# Patient Record
Sex: Male | Born: 1955 | Race: Black or African American | Hispanic: No | Marital: Single | Smoking: Never smoker
Health system: Southern US, Community
[De-identification: ages and names within clinical notes are randomized; demographics above are authoritative.]

## PROBLEM LIST (undated history)

## (undated) DIAGNOSIS — G629 Polyneuropathy, unspecified: Secondary | ICD-10-CM

## (undated) DIAGNOSIS — F32A Depression, unspecified: Secondary | ICD-10-CM

## (undated) DIAGNOSIS — I509 Heart failure, unspecified: Secondary | ICD-10-CM

## (undated) DIAGNOSIS — I10 Essential (primary) hypertension: Secondary | ICD-10-CM

## (undated) DIAGNOSIS — M869 Osteomyelitis, unspecified: Secondary | ICD-10-CM

## (undated) DIAGNOSIS — F329 Major depressive disorder, single episode, unspecified: Secondary | ICD-10-CM

## (undated) DIAGNOSIS — E785 Hyperlipidemia, unspecified: Secondary | ICD-10-CM

## (undated) DIAGNOSIS — F1010 Alcohol abuse, uncomplicated: Secondary | ICD-10-CM

## (undated) DIAGNOSIS — Z8614 Personal history of Methicillin resistant Staphylococcus aureus infection: Secondary | ICD-10-CM

## (undated) DIAGNOSIS — E119 Type 2 diabetes mellitus without complications: Secondary | ICD-10-CM

## (undated) HISTORY — DX: Personal history of Methicillin resistant Staphylococcus aureus infection: Z86.14

## (undated) HISTORY — DX: Osteomyelitis, unspecified: M86.9

## (undated) HISTORY — DX: Major depressive disorder, single episode, unspecified: F32.9

## (undated) HISTORY — PX: AMPUTATION OF REPLICATED TOES: SHX1136

## (undated) HISTORY — DX: Depression, unspecified: F32.A

## (undated) HISTORY — DX: Heart failure, unspecified: I50.9

## (undated) HISTORY — DX: Polyneuropathy, unspecified: G62.9

## (undated) HISTORY — DX: Alcohol abuse, uncomplicated: F10.10

## (undated) HISTORY — DX: Type 2 diabetes mellitus without complications: E11.9

## (undated) HISTORY — DX: Essential (primary) hypertension: I10

## (undated) HISTORY — DX: Hyperlipidemia, unspecified: E78.5

---

## 2012-05-07 ENCOUNTER — Emergency Department: Payer: Self-pay | Admitting: Emergency Medicine

## 2012-05-31 ENCOUNTER — Observation Stay: Payer: Self-pay | Admitting: Internal Medicine

## 2012-05-31 LAB — CBC
HCT: 31.9 % — ABNORMAL LOW (ref 40.0–52.0)
MCH: 31.9 pg (ref 26.0–34.0)
MCHC: 34.6 g/dL (ref 32.0–36.0)
MCV: 92 fL (ref 80–100)
RDW: 12.7 % (ref 11.5–14.5)
WBC: 3.8 10*3/uL (ref 3.8–10.6)

## 2012-05-31 LAB — BASIC METABOLIC PANEL
Anion Gap: 7 (ref 7–16)
Calcium, Total: 8.4 mg/dL — ABNORMAL LOW (ref 8.5–10.1)
Chloride: 105 mmol/L (ref 98–107)
Co2: 26 mmol/L (ref 21–32)
Creatinine: 2.1 mg/dL — ABNORMAL HIGH (ref 0.60–1.30)
EGFR (African American): 39 — ABNORMAL LOW
EGFR (Non-African Amer.): 34 — ABNORMAL LOW
Glucose: 154 mg/dL — ABNORMAL HIGH (ref 65–99)
Sodium: 138 mmol/L (ref 136–145)

## 2012-05-31 LAB — CK TOTAL AND CKMB (NOT AT ARMC)
CK, Total: 245 U/L — ABNORMAL HIGH (ref 35–232)
CK, Total: 186 U/L (ref 35–232)
CK-MB: 3.8 ng/mL — ABNORMAL HIGH (ref 0.5–3.6)
CK-MB: 3 ng/mL (ref 0.5–3.6)
CK-MB: 2.1 ng/mL (ref 0.5–3.6)

## 2012-05-31 LAB — TROPONIN I
Troponin-I: <0.02 ng/mL
Troponin-I: <0.02 ng/mL
Troponin-I: <0.02 ng/mL

## 2012-06-01 LAB — LIPID PANEL
HDL Cholesterol: 38 mg/dL — ABNORMAL LOW (ref 40–60)
Triglycerides: 230 mg/dL — ABNORMAL HIGH (ref 0–200)
VLDL Cholesterol, Calc: 46 mg/dL — ABNORMAL HIGH (ref 5–40)

## 2012-06-01 LAB — CREATININE, SERUM: EGFR (African American): 57 — ABNORMAL LOW

## 2012-06-24 ENCOUNTER — Emergency Department: Payer: Self-pay

## 2012-06-24 LAB — COMPREHENSIVE METABOLIC PANEL
Albumin: 3.5 g/dL (ref 3.4–5.0)
Alkaline Phosphatase: 76 U/L (ref 50–136)
Anion Gap: 8 (ref 7–16)
Bilirubin,Total: 0.3 mg/dL (ref 0.2–1.0)
Calcium, Total: 8.5 mg/dL (ref 8.5–10.1)
Chloride: 104 mmol/L (ref 98–107)
Creatinine: 1.85 mg/dL — ABNORMAL HIGH (ref 0.60–1.30)
EGFR (African American): 46 — ABNORMAL LOW
Glucose: 197 mg/dL — ABNORMAL HIGH (ref 65–99)
Osmolality: 280 (ref 275–301)
Potassium: 4.1 mmol/L (ref 3.5–5.1)
Sodium: 135 mmol/L — ABNORMAL LOW (ref 136–145)
Total Protein: 6.8 g/dL (ref 6.4–8.2)

## 2012-06-24 LAB — CBC
HGB: 9.8 g/dL — ABNORMAL LOW (ref 13.0–18.0)
MCH: 29.3 pg (ref 26.0–34.0)
MCHC: 31.8 g/dL — ABNORMAL LOW (ref 32.0–36.0)
Platelet: 188 10*3/uL (ref 150–440)
WBC: 3.9 10*3/uL (ref 3.8–10.6)

## 2012-06-24 LAB — ETHANOL
Ethanol %: 0.089 % — ABNORMAL HIGH (ref 0.000–0.080)
Ethanol: 89 mg/dL

## 2013-04-27 ENCOUNTER — Emergency Department: Payer: Self-pay | Admitting: Emergency Medicine

## 2013-04-27 LAB — BASIC METABOLIC PANEL
Anion Gap: 7 (ref 7–16)
BUN: 32 mg/dL — ABNORMAL HIGH (ref 7–18)
Calcium, Total: 8.8 mg/dL (ref 8.5–10.1)
Chloride: 102 mmol/L (ref 98–107)
Co2: 27 mmol/L (ref 21–32)
Creatinine: 4.03 mg/dL — ABNORMAL HIGH (ref 0.60–1.30)
EGFR (African American): 18 — ABNORMAL LOW
EGFR (Non-African Amer.): 15 — ABNORMAL LOW
Glucose: 137 mg/dL — ABNORMAL HIGH (ref 65–99)
Osmolality: 281 (ref 275–301)
Potassium: 5.2 mmol/L — ABNORMAL HIGH (ref 3.5–5.1)
Sodium: 136 mmol/L (ref 136–145)

## 2013-04-27 LAB — URINALYSIS, COMPLETE
Bacteria: NONE SEEN
Bilirubin,UR: NEGATIVE
Glucose,UR: NEGATIVE mg/dL (ref 0–75)
Hyaline Cast: 8
Ketone: NEGATIVE
Leukocyte Esterase: NEGATIVE
Nitrite: NEGATIVE
Ph: 5 (ref 4.5–8.0)
Protein: 100
RBC,UR: 1 /HPF (ref 0–5)
Specific Gravity: 1.015 (ref 1.003–1.030)
Squamous Epithelial: <1
WBC UR: <1 /HPF (ref 0–5)

## 2013-04-27 LAB — CBC
MCHC: 34.6 g/dL (ref 32.0–36.0)
MCV: 88 fL (ref 80–100)
RBC: 4 10*6/uL — ABNORMAL LOW (ref 4.40–5.90)
WBC: 5.9 10*3/uL (ref 3.8–10.6)

## 2013-04-27 LAB — TROPONIN I: Troponin-I: <0.02 ng/mL

## 2013-05-21 ENCOUNTER — Emergency Department: Payer: Self-pay | Admitting: Emergency Medicine

## 2013-05-21 LAB — CBC WITH DIFFERENTIAL/PLATELET
Basophil %: 0.7 %
Eosinophil #: 0.1 10*3/uL (ref 0.0–0.7)
Eosinophil %: 1.7 %
HCT: 31.8 % — ABNORMAL LOW (ref 40.0–52.0)
HGB: 11.3 g/dL — ABNORMAL LOW (ref 13.0–18.0)
Lymphocyte #: 1.6 10*3/uL (ref 1.0–3.6)
Lymphocyte %: 38.8 %
Monocyte %: 8.2 %
Neutrophil %: 50.6 %
RBC: 3.65 10*6/uL — ABNORMAL LOW (ref 4.40–5.90)
RDW: 12.8 % (ref 11.5–14.5)
WBC: 4.2 10*3/uL (ref 3.8–10.6)

## 2013-05-21 LAB — COMPREHENSIVE METABOLIC PANEL
Albumin: 3.3 g/dL — ABNORMAL LOW (ref 3.4–5.0)
Alkaline Phosphatase: 74 U/L (ref 50–136)
Anion Gap: 6 — ABNORMAL LOW (ref 7–16)
Bilirubin,Total: 0.2 mg/dL (ref 0.2–1.0)
Calcium, Total: 8.7 mg/dL (ref 8.5–10.1)
Chloride: 108 mmol/L — ABNORMAL HIGH (ref 98–107)
Co2: 23 mmol/L (ref 21–32)
Creatinine: 1.83 mg/dL — ABNORMAL HIGH (ref 0.60–1.30)
EGFR (African American): 46 — ABNORMAL LOW
Osmolality: 282 (ref 275–301)
Potassium: 4.3 mmol/L (ref 3.5–5.1)
SGPT (ALT): 29 U/L (ref 12–78)

## 2013-07-26 ENCOUNTER — Inpatient Hospital Stay: Payer: Self-pay | Admitting: Internal Medicine

## 2013-07-26 LAB — CBC
HCT: 29.8 % — ABNORMAL LOW (ref 40.0–52.0)
HGB: 10.5 g/dL — ABNORMAL LOW (ref 13.0–18.0)
MCHC: 35.3 g/dL (ref 32.0–36.0)
RDW: 13.3 % (ref 11.5–14.5)
WBC: 5.1 10*3/uL (ref 3.8–10.6)

## 2013-07-26 LAB — URINALYSIS, COMPLETE
Bilirubin,UR: NEGATIVE
Glucose,UR: >=500 mg/dL (ref 0–75)
Leukocyte Esterase: NEGATIVE
RBC,UR: <1 /HPF (ref 0–5)
WBC UR: NONE SEEN /HPF (ref 0–5)

## 2013-07-26 LAB — COMPREHENSIVE METABOLIC PANEL
Albumin: 3.4 g/dL (ref 3.4–5.0)
Anion Gap: 8 (ref 7–16)
BUN: 21 mg/dL — ABNORMAL HIGH (ref 7–18)
Bilirubin,Total: 0.4 mg/dL (ref 0.2–1.0)
Calcium, Total: 8.7 mg/dL (ref 8.5–10.1)
Co2: 24 mmol/L (ref 21–32)
Creatinine: 2.33 mg/dL — ABNORMAL HIGH (ref 0.60–1.30)
EGFR (Non-African Amer.): 29 — ABNORMAL LOW
Glucose: 287 mg/dL — ABNORMAL HIGH (ref 65–99)
Osmolality: 258 (ref 275–301)
SGPT (ALT): 34 U/L (ref 12–78)
Sodium: 121 mmol/L — ABNORMAL LOW (ref 136–145)
Total Protein: 6.9 g/dL (ref 6.4–8.2)

## 2013-07-26 LAB — CK TOTAL AND CKMB (NOT AT ARMC): CK, Total: 585 U/L — ABNORMAL HIGH (ref 35–232)

## 2013-07-27 LAB — CK TOTAL AND CKMB (NOT AT ARMC)
CK, Total: 469 U/L — ABNORMAL HIGH (ref 35–232)
CK, Total: 432 U/L — ABNORMAL HIGH (ref 35–232)
CK-MB: 6.5 ng/mL — ABNORMAL HIGH (ref 0.5–3.6)
CK-MB: 5.8 ng/mL — ABNORMAL HIGH (ref 0.5–3.6)

## 2013-07-28 LAB — CBC WITH DIFFERENTIAL/PLATELET
Basophil #: 0 10*3/uL (ref 0.0–0.1)
Eosinophil #: 0.2 10*3/uL (ref 0.0–0.7)
HGB: 10.3 g/dL — ABNORMAL LOW (ref 13.0–18.0)
Lymphocyte %: 36.7 %
MCH: 31.4 pg (ref 26.0–34.0)
Monocyte #: 0.5 x10 3/mm (ref 0.2–1.0)
Neutrophil #: 2.2 10*3/uL (ref 1.4–6.5)
Neutrophil %: 48.1 %
Platelet: 186 10*3/uL (ref 150–440)
RBC: 3.3 10*6/uL — ABNORMAL LOW (ref 4.40–5.90)
RDW: 13.4 % (ref 11.5–14.5)
WBC: 4.6 10*3/uL (ref 3.8–10.6)

## 2013-07-28 LAB — BASIC METABOLIC PANEL
Chloride: 101 mmol/L (ref 98–107)
Creatinine: 1.47 mg/dL — ABNORMAL HIGH (ref 0.60–1.30)
EGFR (African American): 60 — ABNORMAL LOW
EGFR (Non-African Amer.): 51 — ABNORMAL LOW
Glucose: 122 mg/dL — ABNORMAL HIGH (ref 65–99)
Osmolality: 267 (ref 275–301)
Potassium: 4.4 mmol/L (ref 3.5–5.1)
Sodium: 132 mmol/L — ABNORMAL LOW (ref 136–145)

## 2014-04-11 ENCOUNTER — Inpatient Hospital Stay: Payer: Self-pay | Admitting: Specialist

## 2014-04-11 LAB — BASIC METABOLIC PANEL
ANION GAP: 6 — AB (ref 7–16)
Anion Gap: 9 (ref 7–16)
BUN: 9 mg/dL (ref 7–18)
BUN: 8 mg/dL (ref 7–18)
CALCIUM: 8.2 mg/dL — AB (ref 8.5–10.1)
CALCIUM: 8.2 mg/dL — AB (ref 8.5–10.1)
Chloride: 85 mmol/L — ABNORMAL LOW (ref 98–107)
Chloride: 87 mmol/L — ABNORMAL LOW (ref 98–107)
Co2: 23 mmol/L (ref 21–32)
Co2: 26 mmol/L (ref 21–32)
Creatinine: 1.36 mg/dL — ABNORMAL HIGH (ref 0.60–1.30)
Creatinine: 1.32 mg/dL — ABNORMAL HIGH (ref 0.60–1.30)
EGFR (African American): >60
EGFR (Non-African Amer.): 57 — ABNORMAL LOW
EGFR (Non-African Amer.): 59 — ABNORMAL LOW
GLUCOSE: 152 mg/dL — AB (ref 65–99)
Glucose: 160 mg/dL — ABNORMAL HIGH (ref 65–99)
Osmolality: 238 (ref 275–301)
Osmolality: 242 (ref 275–301)
POTASSIUM: 3.8 mmol/L (ref 3.5–5.1)
Potassium: 4.4 mmol/L (ref 3.5–5.1)
Sodium: 117 mmol/L — CL (ref 136–145)
Sodium: 119 mmol/L — CL (ref 136–145)

## 2014-04-11 LAB — CBC WITH DIFFERENTIAL/PLATELET
BASOS PCT: 0.7 %
Basophil #: 0 10*3/uL (ref 0.0–0.1)
EOS ABS: 0 10*3/uL (ref 0.0–0.7)
EOS PCT: 0.6 %
HCT: 30.9 % — ABNORMAL LOW (ref 40.0–52.0)
HGB: 10.7 g/dL — AB (ref 13.0–18.0)
Lymphocyte #: 1.1 10*3/uL (ref 1.0–3.6)
Lymphocyte %: 23.3 %
MCH: 30.7 pg (ref 26.0–34.0)
MCHC: 34.6 g/dL (ref 32.0–36.0)
MCV: 89 fL (ref 80–100)
MONOS PCT: 15.5 %
Monocyte #: 0.7 x10 3/mm (ref 0.2–1.0)
NEUTROS ABS: 2.8 10*3/uL (ref 1.4–6.5)
NEUTROS PCT: 59.9 %
PLATELETS: 220 10*3/uL (ref 150–440)
RBC: 3.49 10*6/uL — AB (ref 4.40–5.90)
RDW: 12.9 % (ref 11.5–14.5)
WBC: 4.7 10*3/uL (ref 3.8–10.6)

## 2014-04-11 LAB — URINALYSIS, COMPLETE
Bacteria: NONE SEEN
Bilirubin,UR: NEGATIVE
Ketone: NEGATIVE
Leukocyte Esterase: NEGATIVE
Nitrite: NEGATIVE
PH: 5 (ref 4.5–8.0)
RBC,UR: 1 /HPF (ref 0–5)
Specific Gravity: 1.01 (ref 1.003–1.030)
Squamous Epithelial: 1
WBC UR: 1 /HPF (ref 0–5)

## 2014-04-11 LAB — MAGNESIUM: Magnesium: 1.1 mg/dL — ABNORMAL LOW

## 2014-04-11 LAB — CK-MB
CK-MB: 3.8 ng/mL — ABNORMAL HIGH (ref 0.5–3.6)
CK-MB: 4 ng/mL — ABNORMAL HIGH (ref 0.5–3.6)

## 2014-04-11 LAB — TROPONIN I

## 2014-04-12 DIAGNOSIS — I059 Rheumatic mitral valve disease, unspecified: Secondary | ICD-10-CM

## 2014-04-12 LAB — CBC WITH DIFFERENTIAL/PLATELET
BASOS ABS: 0 10*3/uL (ref 0.0–0.1)
Basophil %: 0.2 %
EOS ABS: 0 10*3/uL (ref 0.0–0.7)
EOS PCT: 0.8 %
HCT: 28.5 % — ABNORMAL LOW (ref 40.0–52.0)
HGB: 10 g/dL — AB (ref 13.0–18.0)
LYMPHS ABS: 1.2 10*3/uL (ref 1.0–3.6)
Lymphocyte %: 30.6 %
MCH: 30.7 pg (ref 26.0–34.0)
MCHC: 35.3 g/dL (ref 32.0–36.0)
MCV: 87 fL (ref 80–100)
Monocyte #: 0.5 x10 3/mm (ref 0.2–1.0)
Monocyte %: 13.7 %
NEUTROS ABS: 2.2 10*3/uL (ref 1.4–6.5)
Neutrophil %: 54.7 %
Platelet: 210 10*3/uL (ref 150–440)
RBC: 3.27 10*6/uL — AB (ref 4.40–5.90)
RDW: 12.7 % (ref 11.5–14.5)
WBC: 4 10*3/uL (ref 3.8–10.6)

## 2014-04-12 LAB — BASIC METABOLIC PANEL
Anion Gap: 7 (ref 7–16)
Anion Gap: 6 — ABNORMAL LOW (ref 7–16)
Anion Gap: 4 — ABNORMAL LOW (ref 7–16)
Anion Gap: 4 — ABNORMAL LOW (ref 7–16)
BUN: 12 mg/dL (ref 7–18)
BUN: 11 mg/dL (ref 7–18)
BUN: 10 mg/dL (ref 7–18)
BUN: 12 mg/dL (ref 7–18)
CALCIUM: 8.9 mg/dL (ref 8.5–10.1)
CALCIUM: 7.9 mg/dL — AB (ref 8.5–10.1)
CHLORIDE: 89 mmol/L — AB (ref 98–107)
CHLORIDE: 92 mmol/L — AB (ref 98–107)
CO2: 26 mmol/L (ref 21–32)
CO2: 28 mmol/L (ref 21–32)
CREATININE: 1.36 mg/dL — AB (ref 0.60–1.30)
CREATININE: 1.38 mg/dL — AB (ref 0.60–1.30)
Calcium, Total: 8.3 mg/dL — ABNORMAL LOW (ref 8.5–10.1)
Calcium, Total: 9.1 mg/dL (ref 8.5–10.1)
Chloride: 88 mmol/L — ABNORMAL LOW (ref 98–107)
Chloride: 93 mmol/L — ABNORMAL LOW (ref 98–107)
Co2: 25 mmol/L (ref 21–32)
Co2: 28 mmol/L (ref 21–32)
Creatinine: 1.28 mg/dL (ref 0.60–1.30)
Creatinine: 1.36 mg/dL — ABNORMAL HIGH (ref 0.60–1.30)
EGFR (African American): >60
EGFR (African American): >60
EGFR (African American): >60
EGFR (Non-African Amer.): 57 — ABNORMAL LOW
EGFR (Non-African Amer.): >60
GFR CALC NON AF AMER: 57 — AB
GFR CALC NON AF AMER: 56 — AB
GLUCOSE: 115 mg/dL — AB (ref 65–99)
GLUCOSE: 131 mg/dL — AB (ref 65–99)
Glucose: 162 mg/dL — ABNORMAL HIGH (ref 65–99)
Glucose: 177 mg/dL — ABNORMAL HIGH (ref 65–99)
OSMOLALITY: 248 (ref 275–301)
OSMOLALITY: 252 (ref 275–301)
Osmolality: 245 (ref 275–301)
Osmolality: 251 (ref 275–301)
POTASSIUM: 4.8 mmol/L (ref 3.5–5.1)
Potassium: 4.2 mmol/L (ref 3.5–5.1)
Potassium: 4.6 mmol/L (ref 3.5–5.1)
Potassium: 4.7 mmol/L (ref 3.5–5.1)
SODIUM: 124 mmol/L — AB (ref 136–145)
Sodium: 120 mmol/L — CL (ref 136–145)
Sodium: 121 mmol/L — ABNORMAL LOW (ref 136–145)
Sodium: 125 mmol/L — ABNORMAL LOW (ref 136–145)

## 2014-04-12 LAB — MAGNESIUM: MAGNESIUM: 1.7 mg/dL — AB

## 2014-04-12 LAB — CK-MB: CK-MB: 4.2 ng/mL — ABNORMAL HIGH (ref 0.5–3.6)

## 2014-04-12 LAB — HEMOGLOBIN A1C: Hemoglobin A1C: 8 % — ABNORMAL HIGH (ref 4.2–6.3)

## 2014-04-12 LAB — TSH: Thyroid Stimulating Horm: 1.42 u[IU]/mL

## 2014-04-12 LAB — TROPONIN I

## 2014-04-13 LAB — BASIC METABOLIC PANEL
ANION GAP: 8 (ref 7–16)
BUN: 13 mg/dL (ref 7–18)
CHLORIDE: 95 mmol/L — AB (ref 98–107)
CO2: 26 mmol/L (ref 21–32)
CREATININE: 1.17 mg/dL (ref 0.60–1.30)
Calcium, Total: 8.7 mg/dL (ref 8.5–10.1)
Glucose: 82 mg/dL (ref 65–99)
OSMOLALITY: 258 (ref 275–301)
Potassium: 4.1 mmol/L (ref 3.5–5.1)
Sodium: 129 mmol/L — ABNORMAL LOW (ref 136–145)

## 2014-04-13 LAB — SODIUM
SODIUM: 127 mmol/L — AB (ref 136–145)
SODIUM: 126 mmol/L — AB (ref 136–145)
Sodium: 128 mmol/L — ABNORMAL LOW (ref 136–145)

## 2014-04-14 LAB — SODIUM
SODIUM: 129 mmol/L — AB (ref 136–145)
Sodium: 128 mmol/L — ABNORMAL LOW (ref 136–145)
Sodium: 126 mmol/L — ABNORMAL LOW (ref 136–145)

## 2014-04-15 DIAGNOSIS — Z8614 Personal history of Methicillin resistant Staphylococcus aureus infection: Secondary | ICD-10-CM

## 2014-04-15 HISTORY — DX: Personal history of Methicillin resistant Staphylococcus aureus infection: Z86.14

## 2014-04-15 LAB — BASIC METABOLIC PANEL
ANION GAP: 6 — AB (ref 7–16)
BUN: 16 mg/dL (ref 7–18)
CO2: 28 mmol/L (ref 21–32)
Calcium, Total: 8.8 mg/dL (ref 8.5–10.1)
Chloride: 97 mmol/L — ABNORMAL LOW (ref 98–107)
Creatinine: 1.18 mg/dL (ref 0.60–1.30)
EGFR (African American): >60
EGFR (Non-African Amer.): >60
Glucose: 103 mg/dL — ABNORMAL HIGH (ref 65–99)
OSMOLALITY: 264 (ref 275–301)
POTASSIUM: 4.1 mmol/L (ref 3.5–5.1)
SODIUM: 131 mmol/L — AB (ref 136–145)

## 2014-04-18 LAB — PATHOLOGY REPORT

## 2014-04-19 LAB — WOUND CULTURE

## 2014-04-22 LAB — WOUND CULTURE

## 2014-07-20 ENCOUNTER — Emergency Department: Payer: Self-pay | Admitting: Emergency Medicine

## 2014-07-20 LAB — COMPREHENSIVE METABOLIC PANEL
ALBUMIN: 3.9 g/dL (ref 3.4–5.0)
Alkaline Phosphatase: 70 U/L
Anion Gap: 8 (ref 7–16)
BUN: 21 mg/dL — ABNORMAL HIGH (ref 7–18)
Bilirubin,Total: 0.4 mg/dL (ref 0.2–1.0)
CALCIUM: 8.3 mg/dL — AB (ref 8.5–10.1)
CREATININE: 1.61 mg/dL — AB (ref 0.60–1.30)
Chloride: 103 mmol/L (ref 98–107)
Co2: 25 mmol/L (ref 21–32)
EGFR (African American): 57 — ABNORMAL LOW
EGFR (Non-African Amer.): 47 — ABNORMAL LOW
Glucose: 153 mg/dL — ABNORMAL HIGH (ref 65–99)
Osmolality: 278 (ref 275–301)
Potassium: 4.3 mmol/L (ref 3.5–5.1)
SGOT(AST): 51 U/L — ABNORMAL HIGH (ref 15–37)
SGPT (ALT): 60 U/L
Sodium: 136 mmol/L (ref 136–145)
TOTAL PROTEIN: 7.4 g/dL (ref 6.4–8.2)

## 2014-07-20 LAB — PROTIME-INR
INR: 1
PROTHROMBIN TIME: 13.4 s (ref 11.5–14.7)

## 2014-07-20 LAB — CBC
HCT: 37.7 % — ABNORMAL LOW (ref 40.0–52.0)
HGB: 12.5 g/dL — ABNORMAL LOW (ref 13.0–18.0)
MCH: 30.9 pg (ref 26.0–34.0)
MCHC: 33.3 g/dL (ref 32.0–36.0)
MCV: 93 fL (ref 80–100)
PLATELETS: 178 10*3/uL (ref 150–440)
RBC: 4.06 10*6/uL — ABNORMAL LOW (ref 4.40–5.90)
RDW: 14.1 % (ref 11.5–14.5)
WBC: 2.7 10*3/uL — ABNORMAL LOW (ref 3.8–10.6)

## 2014-07-20 LAB — LIPASE, BLOOD: Lipase: 56 U/L — ABNORMAL LOW (ref 73–393)

## 2014-07-20 LAB — ETHANOL: ETHANOL LVL: 173 mg/dL

## 2014-08-07 ENCOUNTER — Inpatient Hospital Stay: Payer: Self-pay | Admitting: Internal Medicine

## 2014-08-07 LAB — COMPREHENSIVE METABOLIC PANEL
ALBUMIN: 4 g/dL (ref 3.4–5.0)
ANION GAP: 10 (ref 7–16)
Alkaline Phosphatase: 87 U/L
BUN: 17 mg/dL (ref 7–18)
Bilirubin,Total: 0.4 mg/dL (ref 0.2–1.0)
CHLORIDE: 100 mmol/L (ref 98–107)
CO2: 27 mmol/L (ref 21–32)
Calcium, Total: 8.4 mg/dL — ABNORMAL LOW (ref 8.5–10.1)
Creatinine: 1.53 mg/dL — ABNORMAL HIGH (ref 0.60–1.30)
EGFR (African American): >60
GFR CALC NON AF AMER: 50 — AB
GLUCOSE: 184 mg/dL — AB (ref 65–99)
Osmolality: 280 (ref 275–301)
POTASSIUM: 4.4 mmol/L (ref 3.5–5.1)
SGOT(AST): 62 U/L — ABNORMAL HIGH (ref 15–37)
SGPT (ALT): 77 U/L — ABNORMAL HIGH
SODIUM: 137 mmol/L (ref 136–145)
TOTAL PROTEIN: 8 g/dL (ref 6.4–8.2)

## 2014-08-07 LAB — URINALYSIS, COMPLETE
Bacteria: NONE SEEN
Bilirubin,UR: NEGATIVE
Glucose,UR: >=500 mg/dL (ref 0–75)
KETONE: NEGATIVE
LEUKOCYTE ESTERASE: NEGATIVE
Nitrite: NEGATIVE
Ph: 5 (ref 4.5–8.0)
Protein: 100
RBC,UR: 4 /HPF (ref 0–5)
SPECIFIC GRAVITY: 1.006 (ref 1.003–1.030)
Squamous Epithelial: NONE SEEN

## 2014-08-07 LAB — CBC
HCT: 41.4 % (ref 40.0–52.0)
HGB: 13.3 g/dL (ref 13.0–18.0)
MCH: 30.3 pg (ref 26.0–34.0)
MCHC: 32.2 g/dL (ref 32.0–36.0)
MCV: 94 fL (ref 80–100)
PLATELETS: 204 10*3/uL (ref 150–440)
RBC: 4.4 10*6/uL (ref 4.40–5.90)
RDW: 13.7 % (ref 11.5–14.5)
WBC: 6.4 10*3/uL (ref 3.8–10.6)

## 2014-08-07 LAB — ETHANOL: Ethanol: 5 mg/dL (ref 0–80)

## 2014-08-07 LAB — LIPASE, BLOOD: Lipase: 50 U/L — ABNORMAL LOW (ref 73–393)

## 2014-08-07 LAB — TROPONIN I

## 2014-08-08 LAB — CBC WITH DIFFERENTIAL/PLATELET
BASOS ABS: 0 10*3/uL (ref 0.0–0.1)
Basophil %: 0.2 %
EOS ABS: 0 10*3/uL (ref 0.0–0.7)
Eosinophil %: 0.7 %
HCT: 35.5 % — AB (ref 40.0–52.0)
HGB: 11.3 g/dL — ABNORMAL LOW (ref 13.0–18.0)
LYMPHS ABS: 1.5 10*3/uL (ref 1.0–3.6)
Lymphocyte %: 30.8 %
MCH: 29.8 pg (ref 26.0–34.0)
MCHC: 31.9 g/dL — ABNORMAL LOW (ref 32.0–36.0)
MCV: 93 fL (ref 80–100)
Monocyte #: 0.4 x10 3/mm (ref 0.2–1.0)
Monocyte %: 7.9 %
NEUTROS ABS: 2.9 10*3/uL (ref 1.4–6.5)
NEUTROS PCT: 60.4 %
Platelet: 188 10*3/uL (ref 150–440)
RBC: 3.8 10*6/uL — ABNORMAL LOW (ref 4.40–5.90)
RDW: 13.8 % (ref 11.5–14.5)
WBC: 4.8 10*3/uL (ref 3.8–10.6)

## 2014-08-08 LAB — BASIC METABOLIC PANEL
ANION GAP: 7 (ref 7–16)
BUN: 15 mg/dL (ref 7–18)
CALCIUM: 7.8 mg/dL — AB (ref 8.5–10.1)
CHLORIDE: 108 mmol/L — AB (ref 98–107)
Co2: 26 mmol/L (ref 21–32)
Creatinine: 1.43 mg/dL — ABNORMAL HIGH (ref 0.60–1.30)
EGFR (Non-African Amer.): 54 — ABNORMAL LOW
GLUCOSE: 68 mg/dL (ref 65–99)
Osmolality: 280 (ref 275–301)
Potassium: 4 mmol/L (ref 3.5–5.1)
Sodium: 141 mmol/L (ref 136–145)

## 2015-02-07 NOTE — H&P (Signed)
PATIENT NAME:  Maurice Jordan, Maurice Jordan MR#:  [id number] DATE OF BIRTH:  1955/11/09  DATE OF ADMISSION:  05/31/2012  PRIMARY CARE PHYSICIAN:  Dr. Francesco Jordan at [facility].   CHIEF COMPLAINT: Chest pain precordial today.   HISTORY OF PRESENT ILLNESS:  Maurice Jordan is a 59 year old African American gentleman with history of hyperlipidemia, depression, hypertension, diabetes, who comes to the Emergency Room accompanied by family member with mid substernal chest pain that woke him up around 5:00 a.m. The patient was brought to the Emergency Room where he is in sinus rhythm. His first set of cardiac enzymes is negative. He has also received some aspirin and nitroglycerin. He has been complaining of some indigestion. The patient has history of drinking alcohol. He drank three shots of gin last night. He is currently placed on the CIWA protocol by the Emergency Room physician. The patient is being admitted for chest pain rule out given his other risk factors of diabetes and hypertension. He is also noted to have creatinine of 2.10. The patient appears euvolemic. It likely is his baseline with CKD given long-standing history of hypertension and diabetes.   PAST MEDICAL HISTORY:    1. Hypercholesterolemia.  2. Depression.  3. Hypertension.  4. Diabetes.  5. History of superficial abscess drainage over the left posterior scalp at [facility] done about a week to 10 days ago.   PAST SURGICAL HISTORY: None.   FAMILY HISTORY: Positive for hypertension.   CURRENT MEDICATIONS:  1. Celexa 20 mg p.o. daily.  2. Lantus 12 units at bedtime.  3. Neurontin 300 mg 3 capsules at bedtime.  4. Norvasc 5 mg daily.  5. Zocor 20 mg at bedtime.  6. Aspirin 81 mg daily.   REVIEW OF SYSTEMS: CONSTITUTIONAL: No fever, fatigue, weakness. EYES: No blurred or double vision. ENT: No tinnitus, ear pain, hearing loss. RESPIRATORY: No cough, wheeze, or hemoptysis. CARDIOVASCULAR: Positive for mid substernal chest pain  earlier. No arrhythmia or edema. GASTROINTESTINAL: No nausea, vomiting, diarrhea. Positive for gastroesophageal reflux disease. GENITOURINARY: No dysuria or hematuria. ENDOCRINE: No polyuria or nocturia. Positive for diabetes. SKIN: No acne or rash. MUSCULOSKELETAL: Positive for arthritis. NEUROLOGIC: No cerebrovascular accident, transient ischemic attack. PSYCH: No anxiety or depression. All other systems reviewed and negative.   PHYSICAL EXAMINATION:  GENERAL: The patient is awake, alert, oriented x3, not in acute distress.   VITAL SIGNS: He is afebrile, pulse 85, blood pressure 169/105, sats are 98% on room air.   HEENT: Atraumatic, normocephalic. The patient has a dressing present over his left posterior scalp, not draining much.   NECK: Supple. No JVD. No carotid bruit.   LUNGS: Clear to auscultation bilaterally. No rales, rhonchi, respiratory distress or labored breathing.   HEART: Both the heart sounds are normal. Rate and rhythm is regular. PMI not lateralized. Chest is nontender.   EXTREMITIES: Good pedal pulses, good femoral pulses. No lower extremity edema.   PSYCH: The patient is awake, alert, oriented x3.  LABORATORY, RADIOLOGICAL AND DIAGNOSTIC DATA: EKG shows normal sinus rhythm. First set of cardiac enzymes are negative. Chest x-ray: No acute cardiopulmonary abnormality. Hemoglobin and hematocrit 11 and 31.9, white count 3.8, platelet count 182. Creatinine is 2.10. Glucose 152, BUN 32, CPK MB is 3.8.   ASSESSMENT: 59 year old Maurice Jordan who has history of hypertension, diabetes, depression and hypercholesterolemia who comes to the Emergency Room with:  1. Chest pain, rule out ACS/unstable angina. The patient has risk factors with longstanding history of hypertension and diabetes. This could very  well be gastroesophageal reflux disease/indigestion given his history of alcohol use daily with some alcohol-induced gastritis. We will place the patient on aspirin. Continue his  Norvasc. Continue p.r.n. nitroglycerin. We will also continue the statins. Cycle cardiac enzymes x3. Check lipid profile. Will schedule the patient for Myoview stress test unless he rules in. Will have cardiology consultation then.  2. Type 2 diabetes, on Lantus. We will continue his home dose Lantus and sliding scale.  3. Peripheral neuropathy. We will continue the patient on his Neurontin.  4. Hypertension. Continue Norvasc.  5. Hyperlipidemia. On Zocor.  6. Depression. We will continue the patient on Celexa.  7. Daily alcohol use. The patient had continued alcohol in the form of gin, three shots last night. He drinks about 2 to 3 beers daily. As a preventive measure, we will put him on CIWA protocol.  8. Recent drainage of abscess over the left posterior scalp. The patient has one more day of clindamycin left which we will continue.  9. CKD stage III, appears likely chronic. Will give some IV fluids. Monitor creatinine. The patient appears euvolemic. This probably is his likely baseline. I do not have any other old labs for comparison.  10. Further work-up according to the patient's clinical course. Hospital admission plan was discussed with the patient and family members.   TIME SPENT: 45 minutes.    ____________________________ Maurice Hail Allena Katz, MD sap:ap D: 05/31/2012 10:33:37 ET T: 05/31/2012 11:45:36 ET JOB#: [id number]  cc: Maurice Bradeen A. Allena Katz, MD, <Dictator> [facility] Internal Medicine, Dr. Sandford Craze MD ELECTRONICALLY SIGNED 06/01/2012 16:06

## 2015-02-07 NOTE — Discharge Summary (Signed)
PATIENT NAME:  Maurice Jordan, Maurice Jordan MR#:  [id number] DATE OF BIRTH:  1955-12-17  DATE OF ADMISSION:  05/31/2012 DATE OF DISCHARGE:  06/01/2012  PRESENTING COMPLAINT: Chest pain, indigestion.   DISCHARGE DIAGNOSES:  1. Chest pain, appears noncardiac likely due to gastroesophageal reflux disease.  2. Hypertension.  3. Chronic kidney disease stage II.  4. Type 2 diabetes.  5. History of alcohol abuse.   CONDITION ON DISCHARGE: Fair.   PROCEDURE: Myoview stress test, negative for ischemia.   MEDICATIONS:  1. Citalopram 20 mg daily.  2. Lantus 12 units at bedtime.  3. Gabapentin 300 mg oral capsule 3 capsules at bedtime.  4. Amlodipine 5 mg daily.  5. Simvastatin 20 mg daily.  6. Prilosec 20 mg p.o. b.i.d.  7. Maalox 30 mL t.i.d. p.r.n.   FOLLOW UP: Follow up with [facility] internal medicine in 1 to 2 weeks.   LABORATORY, DIAGNOSTIC AND RADIOLOGICAL DATA: Magnesium 1.6. Cholesterol 126, triglycerides 230, HDL 38, LDL 42.   Creatinine 1.5. Troponin x3 is negative including CPK and CK total.   Echo Doppler showed ejection fraction of 40% to 45%. There is trace tricuspid regurgitation. There is trace MR. Aortic valve is not visualized well. Ejection fraction of 40% to 45%.   Chest x-ray: No acute cardiopulmonary disease.   Hemoglobin and hematocrit 11.0 and 31.9, white count 3.8, glucose 154, BUN 34, chloride 105, potassium 4.0. EKG showed normal sinus rhythm.   BRIEF SUMMARY OF HOSPITAL COURSE: Maurice Jordan is a 59 year old African American gentleman with history of hypertension, diabetes, depression, comes to the Emergency Room with:  1. Chest pain. Patient did have some midsternal chest pain which he complained more of symptoms of indigestion. He has risk factors for coronary artery disease which is hypertension and hyperlipidemia along with diabetes. He was admitted on telemetry floor where he was ruled out for acute coronary syndrome with three sets of negative cardiac enzymes, negative  Myoview stress test and normal EKG. Patient was started on Prilosec and given Maalox; his symptoms improved. He is also advised on alcohol cessation since he could be having alcohol-induced gastritis. He was continued on aspirin and his home medications along with statins. His Myoview remained negative.  2. Type 2 diabetes, on Lantus.  3. Peripheral neuropathy. Continue Neurontin.  4. Hypertension. Continue Norvasc.  5. Hyperlipidemia. On Zocor.  6. Depression. Citalopram was continued.  7. Patient has history of daily alcohol use. He drinks about 2 to 3 beers on a daily basis along with some hard liquor. He was placed on CIWA protocol for preventive measure. Did not have any symptoms of withdrawal. He was advised on alcohol cessation.  8. Recent drainage of abscess over the left posterior scalp, completed clindamycin.  9. Chronic kidney stage II, appears likely chronic in the setting of diabetes and hypertension. Creatinine was down to 1.54 at discharge.  10. Hospital stay otherwise remained stable. Patient will follow up with internal medicine at [facility].   TIME SPENT: 40 minutes.  ____________________________ Wylie Hail Allena Katz, MD sap:cms D: 06/02/2012 06:57:37 ET T: 06/02/2012 12:23:51 ET  JOB#: [id number] cc: Elivia Robotham A. Allena Katz, MD, <Dictator> Willow Ora MD ELECTRONICALLY SIGNED 06/12/2012 12:40

## 2015-02-10 NOTE — Discharge Summary (Signed)
PATIENT NAME:  Maurice Jordan, Maurice Jordan MR#:  [id number] DATE OF BIRTH:  11-18-1955  DATE OF ADMISSION:  07/26/2013 DATE OF DISCHARGE:  07/28/2013  PRIMARY CARE PHYSICIAN:  Dr. Derinda Sis at [facility].   FINAL DIAGNOSES: 1.  Severe hyponatremia.  2.  Acute renal failure on chronic kidney disease.  3.  Alcohol abuse.  4.  Syncope.  5.  Diabetes.  6.  Accelerated hypertension.  7.  Hyperlipidemia.  8.  Right knee swelling.   MEDICATIONS ON DISCHARGE: Include Celexa 20 mg daily, gabapentin 300 mg 3 capsules at bedtime, Lantus 15 units subcutaneous injection at bedtime, lisinopril 5 mg daily, aspirin 81 mg daily, simvastatin 20 mg daily, thiamine 100 mg daily, multivitamin 1 tablet daily, folic acid 1 mg daily, clonidine 0.1 mg twice a day.   Ace bandage to the right knee. Physical therapy did recommend home health physical therapy but the patient refused.   DIET: Low sodium diet, carbohydrate-controlled diet, regular consistency.   ACTIVITY: As tolerated.   FOLLOWUP: In 1 to 2 weeks with Dr. Derinda Sis.   HOSPITAL COURSE: The patient was admitted 07/26/2013 and discharged 07/28/2013, came in with a syncopal episode, found to have a low sodium, was placed on CIWA protocol for alcohol abuse. Laboratory and radiological data during the hospital course included troponin negative. Glucose 287, BUN 21, creatinine 2.33, sodium 121, potassium 4.4, chloride 89, CO2 24, calcium 8.7. Liver function tests AST slightly elevated at 41, other liver function tests normal range. White blood cell count 5.1, H and H 10.5 and 29.8, platelet count 170. EKG showed normal sinus rhythm, left ventricular hypertrophy with repolarization abnormality. Urinalysis 1+ blood, 500 mg/dL of glucose. Next 2 troponins are negative. Ultrasound of the carotid, plaque without evidence of stenosis. Creatinine upon discharge 1.47, glucose 181. Hemoglobin 10.3.   HOSPITAL COURSE PER PROBLEM LIST:  1.  For the patient's severe hyponatremia:  Fluid restriction and stopping alcohol helped out with the sodium.  Sodium did come up to 132.  2.  Acute renal failure on chronic kidney disease. He was given IV fluid hydration. Creatinine did improve down to 1.47. The patient does have chronic kidney disease stage 3. The patient was stable for discharge. I did advise him to stop his hydrochlorothiazide but he can continue his low-dose lisinopril.  3.  I think this patient's major problem is alcohol abuse. He must stop his alcohol.  4.  Syncope likely secondary to alcohol abuse and dehydration. The patient was given IV fluid hydration with the CIWA protocol.  5.  Diabetes. He was kept on his Lantus.  6.  Accelerated hypertension: Blood pressure on the day of discharge went up. I did give the patient a stat dose of Norvasc and a stat dose of clonidine. I will prescribe clonidine 0.1 mg twice a day upon going home and continue his lisinopril. We found out from the pharmacy that he also takes hydrochlorothiazide, will hold that at this point.  7.  Hyperlipidemia. He is on Zocor.  8.  His right knee swelling. He hit his knee when he fell down, can use an Ace bandage on that.   TIME SPENT ON DISCHARGE: 50 minutes.   ____________________________ Herschell Dimes. Renae Gloss, MD rjw:cs D: 07/28/2013 14:52:00 ET T: 07/28/2013 15:26:48 ET JOB#: [id number]  cc: Herschell Dimes. Renae Gloss, MD, <Dictator> [place] [facility] Salley Scarlet MD ELECTRONICALLY SIGNED 07/29/2013 12:16

## 2015-02-10 NOTE — H&P (Signed)
PATIENT NAME:  Maurice Jordan, Maurice Jordan MR#:  [id number] DATE OF BIRTH:  02-Mar-1956  DATE OF ADMISSION:  07/26/2013  PRIMARY CARE PHYSICIAN: Nonlocal   REFERRING PHYSICIAN: Dr. Governor Rooks   CHIEF COMPLAINT: Syncope.   HISTORY OF PRESENT ILLNESS: Maurice Jordan is a 59 year old African American male with past medical history of hypertension, diabetes mellitus, depression, presented to the Emergency Department after having an episode of syncope The patient was sitting on the couch, stood up and walked toward the kitchen, lost consciousness in the kitchen. The patient thinks it is a brief period.  After the patient woke up, he called EMS and was brought to the Emergency Department. Work-up in the Emergency Department, the patient was found to have a sodium of 121. The patient has been drinking alcohol, especially heavy over the weekend. The patient states he was celebrating him being released from the prison. Orthostatic blood pressure is negative. The patient states he also drank some alcohol prior to coming to the Emergency Department. He denies having any chest pain, palpitations. The patient states he had a similar episode about 4 to 5 times prior to this.  The patient had echocardiogram done in August 2013, showed ejection fraction of 40% to 45%. CT head in the Emergency Department with no acute abnormalities. The patient denies having any weakness in any part of the body. The patient denies having any seizures in the past. Denies having any cough, shortness of breath.   PAST MEDICAL HISTORY: 1.  Hypertension.  2.  Hyperlipidemia.  3.  Diabetes mellitus.  4.  Depression.   ALLERGIES: No known drug allergies.   HOME MEDICATIONS: 1.  Simvastatin 20 mg daily.  2.  Lisinopril 5 mg once a day.  3.  Lantus 15 units once a day.  4.  Gabapentin 900 mg at bedtime.  5.  Citalopram 20 mg once a day.  6.  Aspirin 81 mg daily.   SOCIAL HISTORY: Denies smoking. Drinks alcohol on a regular basis.  Has a history  of binge drinking. Denies using any illicit drugs.  He lives with his girlfriend, who is currently in rehab for alcoholic use.   FAMILY HISTORY: Positive for hypertension.   REVIEW OF SYSTEMS: CONSTITUTIONAL: Feels generalized weakness.  EYES: No change in vision.  EARS, NOSE AND THROAT: No change in hearing.  RESPIRATORY: No cough, shortness of breath.  CARDIOVASCULAR: No chest pain, palpitations. No pedal edema.  GASTROINTESTINAL: Has a good appetite. No nausea, vomiting, abdominal pain.  GENITOURINARY: No dysuria, hematuria.  ENDOCRINE: No polyuria or polydipsia.  HEMATOLOGIC: No easy bruising or bleeding.  SKIN: No rash or lesions.  MUSCULOSKELETAL: No joint pain, swelling. Had an episode of syncope, no weakness or numbness in any part of the body.  PSYCHIATRIC: Has history of depression.   PHYSICAL EXAMINATION: GENERAL: This is a well-built, well-nourished, age-appropriate male lying down in the bed, not in distress.  VITAL SIGNS: Temperature 99.7, pulse 99, blood pressure 131/78, respiratory rate of 16, oxygen saturation is 98% on room air.  HEAD: Normocephalic, atraumatic. There is no scleral icterus. Conjunctivae normal. Pupils equal and react to light. Extraocular movements are intact. Mucous membranes are moist. No pharyngeal erythema.  NECK: Supple. No lymphadenopathy. No JVD. No carotid bruit. No thyromegaly.  CHEST: Has no focal tenderness.  LUNGS: Bilaterally clear to auscultation.  HEART: S1, S2, regular, tachycardia, no pedal edema. Pulses 2+.  ABDOMEN: Bowel sounds present, soft, nontender, nondistended. No hepatosplenomegaly.   SKIN: No rash or lesions.  MUSCULOSKELETAL: No  joint swelling.  Good range of motion in all the extremities.  NEUROLOGIC: The patient is alert, oriented to place, person and time. Cranial nerves II through XII intact. Motor 5 out of 5 in upper and lower extremities. No sensory deficits.   LABORATORY AND DIAGNOSTIC DATA:  CMP:  BUN 21,  creatinine of 2.33, glucose 287, rest of  are within normal limits. Troponin less than 0.02.  CBC: WBC of 5.7, hemoglobin 10.5, platelet count of 170. Urinalysis negative for nitrites and leukocyte esterase.  CT head without contrast: No acute intracranial abnormality.   ASSESSMENT AND PLAN: Maurice Jordan is a 59 year old male who comes to the Emergency Department with an episode of syncope.   1.  Syncope: The patient is with history of hypertension, diabetes mellitus, has a large differential diagnosis including possible arrhythmias, dehydration, seizures, TIA, acute coronary syndrome. Admit the patient to the monitored bed, cycle cardiac enzymes x 3. Follow up on the telemetry for any possible arrhythmias. Considering the patient's history of alcohol use,  also possibility of history of alcohol seizures disorder. The patient had echocardiogram done about 1 year back, showed ejection fraction of 40% to 45%. Will obtain carotid Dopplers  2.  Hyponatremia:  Sodium of 121, most likely this is secondary to beer potomania and the possibility of some dehydration. Continue with IV fluids and follow up.  3.  Hypertension: Currently well controlled. Continue the home medications.  4.  Diabetes mellitus, insulin-dependent: Continue with the Lantus.  5.  Alcohol use: Counseled with the patient. The patient seems to have poor insight.  6.  Keep the patient on DVT prophylaxis with Lovenox.   TIME SPENT: 50 minutes.   ____________________________ Susa Griffins, MD pv:cb D: 07/26/2013 21:46:55 ET T: 07/26/2013 22:10:27 ET JOB#: [id number]  cc: Susa Griffins, MD, <Dictator> Clerance Lav Gumecindo Hopkin MD ELECTRONICALLY SIGNED 08/08/2013 21:07

## 2015-02-11 NOTE — Op Note (Signed)
PATIENT NAME:  Maurice Jordan, Maurice Jordan MR#:  [id number] DATE OF BIRTH:  May 11, 1956  DATE OF PROCEDURE:  04/15/2014  PREOPERATIVE DIAGNOSES: 1. Osteomyelitis, left third toe.  2. Open ulceration, left fourth toe, proximal interphalangeal joint.   POSTOPERATIVE DIAGNOSES: 1. Osteomyelitis, left third toe.  2. Open ulceration, left fourth toe, proximal interphalangeal joint.   PROCEDURE: 1. Amputation of left third toe.  2. Excision of bone, left fourth toe, proximal interphalangeal joint.   SURGEON: Faryn Sieg A. Ether Griffins, DPM   ANESTHESIA: IV sedation with local.   HEMOSTASIS: None.   COMPLICATIONS: None.   SPECIMEN: Bone from fourth toe PIPJ and amputated left third toe.   OPERATIVE INDICATIONS: This is a 59 year old gentleman who was seen in the hospital with an infection of his left third toe and fourth toe. 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 We discussed surgical intervention, and he has consented to the above-stated surgeries. The risks, benefits, alternatives, and complications associated with surgery were discussed with the patient and full consent has been given.   OPERATIVE PROCEDURE: The patient was brought into the OR and placed on operating table in the supine position. IV sedation was administered by the anesthesia team. The left lower extremity was prepped and draped in the usual sterile fashion. Local block was placed around the digits. Attention was directed to the fourth toe initially, where a dorsal incision was made. Sharp and blunt dissection was carried down to the PIPJ. At this time, the head of the proximal phalanx and part of the base of the middle phalanx were removed. This was then flushed with copious amounts of irrigation. The bone was sent for pathological examination. The incision was then closed with 3-0 nylon with a central area allowed for drainage.    Attention was then directed to the left third toe where a racket-type of incision was made circumferential around the central aspect of the third toe. A full-thickness incision was made and the toe was then excised at the base of the proximal phalanx with a power saw. The wound was then flushed with copious amounts of irrigation. Closure was then performed with 3-0 nylon for skin. A compressive sterile dressing was placed on the left foot. Overall, the patient tolerated the procedure and anesthesia well and was transported from the Operating Room to the PACU with all vital signs stable and neurovascular status intact. I will see him in the outpatient clinic. He will be discharged back to the surgical floor for hopeful discharge tomorrow on oral antibiotics.     ____________________________ Argentina Donovan Ether Griffins, DPM jaf:lm D: 04/15/2014 13:46:18 ET T: 04/16/2014 01:39:35 ET JOB#: [id number]  cc: Jill Alexanders A. Ether Griffins, DPM, <Dictator> Margaruite Top DPM ELECTRONICALLY SIGNED 05/20/2014 7:45

## 2015-02-11 NOTE — Discharge Summary (Signed)
Dates of Admission and Diagnosis:  Date of Admission 07-Aug-2014   Date of Discharge 08-Aug-2014   Admitting Diagnosis hypothermia and hypoglycemia   Final Diagnosis Altered mental status- due to hypothermia and hypoglycemia Hypothermia hypoglycmia Diabetes Mellitus Hypertension Alcoholism    Chief Complaint/History of Present Illness a 59 year old male who was brought into the Emergency Room by ambulance due to altered mental status. The patient, himself, is a very poor historian. Therefore, most of the history obtained from the ER physician and also from the chart. I attempted to call the patient's girlfriend, but I was unable to get in touch with her. The patient apparently was found to be altered and noted to be hypoglycemic. The patient was given an amp of D50 by the EMS, but remained quite lethargic, and, therefore, was brought to the ER. He was noted to be hypothermic with a temperature of 92. The patient presently is on a warming blanket and is getting warm IV fluids. His blood sugars have improved, but his mental status still continues to be quite altered as he is very lethargic. Hospitalist services were contacted for further treatment and evaluation.   Allergies:  No Known Allergies:   Pertinent Past History:  Pertinent Past History diabetes, hypertension, diabetic neuropathy, history of alcohol abuse, hyperlipidemia, depression.   Hospital Course:  Hospital Course a 59 year old male with a past medical history of diabetes, diabetic neuropathy, hypertension, history of alcohol abuse, hyperlipidemia, depression, who presented to the hospital due to altered mental status and noted to be hypothermic and hypoglycemic.   * Hypothermia. The exact etiology of this is unclear. There is no clinical evidence of sepsis or infection.    Likely due to remaining lethargic, now totally alert and oriented. * Altered mental status. likely metabolic encephalopathy secondary to the hypothermia  and hypoglycemia.    Blood sugars have improved. His hypothermia is improved.   totally alert and oriented now. *  Hypoglycemia on presentation and Diabetes.    held lantus- Blood sugar is normal now- I will decrease his lantus to 10 unit from 18- on d/c.   Encouraged him to eat regular and check his sugar frequently. * Diabetic neuropathy. Continue Neurontin.  * Hypertension. Continue with his Coreg.   * History of alcohol abuse. alcohol level low- no withdrawal signs. * Depression. Continue Celexa.   Condition on Discharge Stable   Code Status:  Code Status Full Code   DISCHARGE INSTRUCTIONS HOME MEDS:  Medication Reconciliation: Patient's Home Medications at Discharge:     Medication Instructions  citalopram 20 mg oral tablet  1 tab(s) orally once a day   gabapentin 300 mg oral capsule  3 caps (900mg ) orally once a day (at bedtime)   aspirin low dose 81 mg oral delayed release tablet  1 tab(s) orally once a day   atorvastatin 40 mg oral tablet  2 tabs (80mg ) orally once a day   carvedilol 3.125 mg oral tablet  1 tab(s) orally 2 times a day   lantus solostar pen 100 units/ml subcutaneous solution  10 unit(s) subcutaneous once a day (at bedtime)     Physician's Instructions:  Diet Low Sodium  Low Fat, Low Cholesterol  Carbohydrate Controlled (ADA) Diet   Activity Limitations As tolerated   Return to Work Not Applicable   Time frame for Follow Up Appointment 1-2 weeks  PMD   Electronic Signatures: Altamese Dilling (MD)  (Signed 22-Oct-15 17:03)  Authored: ADMISSION DATE AND DIAGNOSIS, CHIEF COMPLAINT/HPI, Allergies, PERTINENT PAST HISTORY, HOSPITAL COURSE,  DISCHARGE INSTRUCTIONS HOME MEDS, PATIENT INSTRUCTIONS   Last Updated: 22-Oct-15 17:03 by Altamese Dilling (MD)

## 2015-02-11 NOTE — H&P (Signed)
PATIENT NAME:  LEAH, DENNEY MR#:  [id number] DATE OF BIRTH:  15-Dec-1955  DATE OF ADMISSION:  08/07/2014  PRIMARY CARE PHYSICIAN: At [facility].   CHIEF COMPLAINT: Altered mental status, hypothermia, hypoglycemia.   HISTORY OF PRESENT ILLNESS: This is a 59 year old male who was brought into the Emergency Room by ambulance due to altered mental status. The patient, himself, is a very poor historian. Therefore, most of the history obtained from the ER physician and also from the chart. I attempted to call the patient's girlfriend, but I was unable to get in touch with her. The patient apparently was found to be altered and noted to be hypoglycemic. The patient was given an amp of D50 by the EMS, but remained quite lethargic, and, therefore, was brought to the ER. He was noted to be hypothermic with a temperature of 92. The patient presently is on a warming blanket and is getting warm IV fluids. His blood sugars have improved, but his mental status still continues to be quite altered as he is very lethargic. Hospitalist services were contacted for further treatment and evaluation.   REVIEW OF SYSTEMS:  CONSTITUTIONAL: No documented fever. No weight gain, no weight loss.  EYES: No blurred or double vision.  ENT: No tinnitus. No postnasal drip. No redness of the oropharynx.  RESPIRATORY: No cough, no wheeze or hemoptysis no dyspnea.  CARDIOVASCULAR: No chest pain, no orthopnea or palpitation, no syncope.  GASTROINTESTINAL: No nausea, no vomiting, diarrhea or abdominal pain. No melena or hematochezia.  GENITOURINARY: No dysuria or hematuria.  ENDOCRINE: No polyuria or nocturia. No heat or cold intolerance.  HEMATOLOGY: No anemia, no bruising, no bleeding.  INTEGUMENTARY: No rashes. No lesions.  MUSCULOSKELETAL: No arthritis. No swelling. No gout.  NEUROLOGIC: No numbness or tingling. No ataxia. No seizure activity.  PSYCHIATRIC: No anxiety. No insomnia. No ADD.   PAST MEDICAL HISTORY:  Consistent with diabetes, hypertension, diabetic neuropathy, history of alcohol abuse, hyperlipidemia, depression.   ALLERGIES: No known drug allergies.   SOCIAL HISTORY: No smoking. Occasional alcohol use, did drink yesterday. No illicit drug abuse. Lives with his girlfriend.   FAMILY HISTORY: The patient's father died in a car accident. He cannot recall what his mother died from. Diabetes does seem to run in his family.   CURRENT MEDICATIONS: As follows, aspirin 81 mg daily, atorvastatin 80 mg at bedtime, Coreg 3.125 mg b.i.d., Celexa 20 mg daily, gabapentin 3 mg 3 capsules at bedtime, Lantus 18 units at bedtime.   PHYSICAL EXAMINATION: Presently is as follows: VITAL SIGNS: Temperature is 96, pulse 87, respirations 11, blood pressure 164/98, sats 100% on room air.  GENERAL: He is a lethargic -appearing male, but in no apparent distress.  HEENT: He is atraumatic, normocephalic. His extraocular muscles are intact. His pupils are equal to light. His sclerae are anicteric. No conjunctival injection. No pharyngeal erythema.  NECK: Supple. There is no jugular venous distention. No bruits, no lymphadenopathy or thyromegaly.  HEART: Regular rate and rhythm. No murmurs, no rubs, no clicks.  LUNGS: Clear to auscultation bilaterally. No rales, no rhonchi, no wheezes.  ABDOMEN: Soft, flat, nontender, nondistended. Has good bowel sounds. No hepatosplenomegaly appreciated.  EXTREMITIES: No evidence of any cyanosis, clubbing, or peripheral edema. Has +2 pedal and radial pulses bilaterally.  NEUROLOGICAL: The patient is alert and oriented x 3 with no focal motor or sensory deficits appreciated bilaterally.  SKIN: Moist and warm with no rashes appreciated.  LYMPHATIC: There is no cervical or axillary lymphadenopathy.  LABORATORY DATA: Serum glucose of 184, BUN 17, creatinine 1.5, sodium 137, potassium 4.4, chloride 100, bicarbonate 27. The patient's AST 62, ALT 77. Troponin less than 0.02. White cell count  6.4, hemoglobin 13.3, hematocrit 41.4, platelet count 204,000.   Urinalysis within normal limits.   The patient did have a chest x-ray done, which showed no acute cardiopulmonary disease.   The patient also had a CT head done, which showed no acute intracranial abnormality.   ASSESSMENT AND PLAN: This is a 59 year old male with a past medical history of diabetes, diabetic neuropathy, hypertension, history of alcohol abuse, hyperlipidemia, depression, who presented to the hospital due to altered mental status and noted to be hypothermic and hypoglycemic.   1. Hypothermia. The exact etiology of this is unclear. There is no clinical evidence of sepsis or infection. The patient is a very poor historian. There is no family around. The patient is on a warming blanket and getting warm IV fluids, and his temperature is improving. We will continue to follow his temperature presently.  2. Altered mental status. This is likely metabolic encephalopathy secondary to the hypothermia and hypoglycemia. Blood sugars have improved. His hypothermia is improving. We will continue to follow his mental status.  3. Diabetes. Hold his Lantus for now. I will place him on sliding scale insulin. Follow blood sugars.  4. Diabetic neuropathy. Continue Neurontin.  5. Hypertension. Continue with his Coreg.  6. History of alcohol abuse. I will check an alcohol level. The patient's last drink was yesterday. We will watch for symptoms of withdrawal. 7. Depression. Continue Celexa.  CODE STATUS: The patient is a Full Code.   TIME SPENT: 50 minutes.    ____________________________ Rolly Pancake. Cherlynn Kaiser, MD vjs:JT D: 08/07/2014 14:37:02 ET T: 08/07/2014 14:47:36 ET JOB#: [id number]  cc: Rolly Pancake. Cherlynn Kaiser, MD, <Dictator> Houston Siren MD ELECTRONICALLY SIGNED 08/29/2014 10:59

## 2015-02-11 NOTE — H&P (Signed)
PATIENT NAME:  Maurice Jordan, WITZ MR#:  [id number] DATE OF BIRTH:  25-Aug-1956  DATE OF ADMISSION:  04/11/2014  REFERRING PHYSICIAN: Dr. Daryel November.   PRIMARY CARE PHYSICIAN: At [facility].   CHIEF COMPLAINT: Passed out.   HISTORY OF PRESENT ILLNESS: The patient is a 59 year old African American male with diabetes, hypertension, syncope, alcohol abuse, who had a very similar presentation last October. Of note, the patient stated that he was outside in the heat all morning and he was drinking. He had a 40-ounce beer, felt dizzy. He states he passed out as he was standing. He possibly passed out a second time. EMS was called and the patient was brought in here, where routine work-up found significant hyponatremia. Of note, in October he had acute hyponatremia with sodium of 121 with renal failure. This time around, he presents with acute hyponatremia with sodium of 117. Hospitalist services were contacted for further evaluation and management. Currently he is awake, alert, oriented x 3, no obvious distress.   PAST MEDICAL HISTORY: Hypertension, hyperlipidemia, diabetes, CKD, syncope, alcohol abuse, depression.   ALLERGIES: No known drug allergies.   OUTPATIENT MEDICATIONS: Atorvastatin 80 mg daily, lisinopril 5 mg daily, Coreg 3.125 mg 2 times a day, Lantus 18 units once a day, gabapentin 900 mg at bedtime, citalopram 20 mg daily, aspirin 81 mg daily.   SOCIAL HISTORY: No tobacco. Drinks alcohol. He drinks four 12-ounce beers a day. Per chart, he is a binge or drinker in the past but denies this now. He drank last this morning. Lives with girlfriend.   FAMILY HISTORY: Brother and mom with diabetes.   REVIEW OF SYSTEMS:    CONSTITUTIONAL: No fever, chills, weakness.  EYES: No blurry vision or double vision.  ENT: No tinnitus or hearing loss.  RESPIRATORY: No cough, wheezing, or shortness of breath.  CARDIOVASCULAR: Denies palpitations or pains in the chest. He does have some swelling in  the legs, and previous echo in the past had shown some congestive heart failure with EF of 40% to 45%.  GENITOURINARY: Denies dysuria or hematuria.  HEMATOLOGIC AND LYMPHATIC: Denies anemia or easy bruising.  SKIN: No rashes.  MUSCULOSKELETAL: Denies arthritis or gout.  NEUROLOGIC: No focal weakness or numbness.  PSYCHIATRIC: Has a history of depression.   PHYSICAL EXAMINATION: VITAL SIGNS: Temperature on arrival 98.2, pulse rate 84, respiratory rate 18, blood pressure 117/77, O2 sat 97% on room air.  GENERAL: The patient is a well-developed male sitting in bed, no obvious distress.  HEENT: Normocephalic, atraumatic. Pupils are equal and reactive. Anicteric sclerae. Moist mucous membranes.  NECK: Supple. No thyroid tenderness. No cervical lymphadenopathy.  CARDIOVASCULAR: S1, S2 regular. No significant murmurs, rubs, or gallops.  LUNGS: Clear to auscultation without wheezing, rhonchi, or rales.  ABDOMEN: Soft, nontender, nondistended. Positive bowel sounds in all quadrants.  EXTREMITIES: The patient does have 1+ pitting edema to mid shins.  SKIN: No obvious rashes or lesions.  PSYCHIATRIC: Awake, alert, oriented x 3.  NEUROLOGIC: Cranial nerves II through XII grossly intact. Strength is 5 out of 5 in all extremities.   LABORATORY AND DIAGNOSTIC DATA: Glucose 152, BUN 9, creatinine 1.36, sodium 117, potassium 3.8, chloride 85. Troponin negative. White count of 4.7, hemoglobin 10.7, platelets 220. Urinalysis does not suggest an infection.   EKG: Normal sinus rhythm, rate of 82. No acute changes compared to previous EKG in October.   ASSESSMENT AND PLAN: We have a pleasant 59 year old with chronic systolic congestive heart failure, hypertension, hyperlipidemia, diabetes, depression, chronic  kidney disease, who presents with a syncopal episode. He was outside in the heat all morning, drinking alcohol on a very hot day, and also was noted to have hyponatremia. All of these I believe have  contributed to causing his syncope.   In regards to acute hyponatremia, he does not appear to be grossly volume depleted and he appears to be in a euvolemic hyponatremic state. Last hospitalization, he was started on fluid restriction with the fluid from the banana bag and sodium improved, and we would do the same this time around. He has been started on a banana bag. He does not appear to be volume depleted. We would monitor him without giving him any fluids at this time. This is likely secondary to alcohol as well as heat, which makes situational syncope also likely in addition to acute hyponatremia and alcohol-related syncope. We would monitor him on a remote telemetry bed, cycle the troponins to make sure he is not having an acute coronary syndrome, and as there is no recent echocardiogram, obtain another echocardiogram. He does not appear to be in acute congestive heart failure. He does have chronic congestive heart failure, ejection fraction 40% to 45%, and little fluid in the legs but is not symptomatic from that.   In regards to his diabetes, I would continue lower dose of insulin, add a sliding scale. I would continue the Coreg as well the ACE inhibitor at this time. He will be started on heparin for deep vein thrombosis prophylaxis. He is neurologically intact. He was counseled against alcohol use and he nodded his head.   TOTAL TIME SPENT: 50 minutes.   CODE STATUS: The patient is full code.    ____________________________ Krystal Eaton, MD sa:jcm D: 04/11/2014 15:16:11 ET T: 04/11/2014 15:43:16 ET JOB#: [id number]  cc: Krystal Eaton, MD, <Dictator> Krystal Eaton MD ELECTRONICALLY SIGNED 04/20/2014 13:58

## 2015-02-11 NOTE — Discharge Summary (Signed)
PATIENT NAME:  Maurice Jordan, TOELLE MR#:  [id number] DATE OF BIRTH:  08/04/1956  DATE OF ADMISSION:  04/11/2014 DATE OF DISCHARGE:  04/15/2014  For a detailed note, please take a look at the history and physical done on admission by Dr. Jacques Navy.   DIAGNOSES AT DISCHARGE: As follows:  1. Acute hyponatremia, likely hypovolemic hyponatremia.  2. Alcohol abuse.  3. 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  4. Diabetes.  5. Hypertension.   DIET: The patient is being discharged on a low-sodium, low-fat, American Diabetic Association diet.   ACTIVITY: As tolerated.   FOLLOWUP: Dr. Gwyneth Revels, from podiatry in the next 1 to 2 weeks.   DISCHARGE MEDICATIONS:  1. Celexa 20 mg daily.  2. Gabapentin 300 mg 3 caps at bedtime.  3. Lantus 15 units at bedtime.  4. Lisinopril 5 mg daily.  5. Aspirin 81 mg daily.  6. Simvastatin 20 mg daily.  7. Clonidine 0.1 mg b.i.d. 8. Augmentin 500 mg b.i.d.  9. Tylenol with hydrocodone 5/325 one tab q. 4 hours as needed.   CONSULTANTS DURING THE HOSPITAL COURSE: Dr. Mady Haagensen from nephrology, Dr. Gwyneth Revels from podiatry.   PERTINENT STUDIES DONE DURING THE HOSPITAL COURSE: Are as follows: An x-ray of the left foot showing osteomyelitis of the distal phalanx of the left third toe.   HOSPITAL COURSE: This is a 59 year old male who presented to the hospital with a syncopal episode and was noted to be acutely hyponatremic with a sodium of 117.   PROBLEMS:  1. Acute hyponatremia. The most likely cause of this was hypovolemic hyponatremia. The patient apparently had no working air conditioning at his home as his power supply was out and he was drinking some beer to keep himself cool. Therefore, he likely had hypovolemic hyponatremia. He was started on gentle IV fluid hydration and his sodium has significantly improved since admission and now normalized. A nephrology consult was obtained who agreed with this management with no further  acute intervention. The patient had no seizure-type activity related to his hyponatremia. The patient's electricity bill has now been paid by the social services here in the hospital.  2. Alcohol abuse. The patient was high risk for alcohol withdrawal therefore was placed on CIWA protocol, but he has not shown any evidence of delirium tremens while in the hospital. He has strongly been advised to abstain from alcohol.  3. Hypertension. The patient's blood pressure has remained stable in the hospital. He will continue his clonidine and lisinopril upon discharge.  4. Diabetes. The patient's blood sugars have remained stable. He will continue his Levemir and sliding scale insulin.  5. Diabetic neuropathy. The patient was maintained on his Neurontin. He will resume that.  6. Left foot osteomyelitis. The patient was noted to have osteomyelitis of his left third toe. A podiatry consult was obtained. The patient was seen by Dr. Ether Griffins who ended up performing an amputation of the left third toe. He is now in a boot. He is being discharged on oral Augmentin with close followup with podiatry as an outpatient. He is currently afebrile and hemodynamically stable.  The patient is being discharged home with home health nursing services.   TIME SPENT ON DISCHARGE: 40 minutes    ____________________________ Rolly Pancake. Cherlynn Kaiser, MD vjs:lt D: 04/15/2014 16:36:19 ET T: 04/16/2014 07:00:57 ET JOB#: [id number]  cc: Rolly Pancake. Cherlynn Kaiser, MD, <Dictator> Argentina Donovan. Ether Griffins, DPM [place] Siren MD ELECTRONICALLY SIGNED 04/19/2014 20:47

## 2015-02-11 NOTE — Consult Note (Signed)
Admit Diagnosis:   HYPONATREMIA: Onset Date: 13-Apr-2014, Status: Active, Description: HYPONATREMIA    Hypercholesterolemia:    Depression:    Hypertension:    Diabetes:   Home Medications: Medication Instructions Status  cloNIDine 0.1 mg oral tablet 1 tab(s) orally 2 times a day Active  Lantus Solostar Pen 100 units/mL subcutaneous solution 15 unit(s) subcutaneous once a day (at bedtime) Active  lisinopril 5 mg oral tablet 1 tab(s) orally once a day Active  Aspirin Low Dose 81 mg oral delayed release tablet 1 tab(s) orally once a day Active  simvastatin 20 mg oral tablet 1 tab(s) orally once a day (in the morning) Active  citalopram 20 mg oral tablet 1 tab(s) orally once a day Active  gabapentin 300 mg oral capsule 3 cap(s) orally once a day (at bedtime) Active   Lab Results: Routine Chem:  24-Jun-15 04:28   BUN 13  Routine Hem:  23-Jun-15 00:05   WBC (CBC) 4.0    No Known Allergies:   Nursing Flowsheets: **Vital Signs.:   24-Jun-15 13:28  Temperature Temperature (F) 98.7    General Aspect Consult for left foot ulceration. Pt with DM and found to have ulcers to left 3rd and 4th toes.   Case History and Physical Exam:  Cardiovascular Palpable DP and PT pulses.   Musculoskeletal Hammertoe contractures to all lesser toes.   Neurological Pt is neuropathic.   Skin Open ulcer on tip of left 3rd toe with scant purulence that probes to bone.  Moderate purulence from PIPJ of left 4th toe that probes to bone.  Diffuse edema to both toes.    Impression Suspicious for osteomyeltis to left 3rd and 4th toes. Xrays ordered. Culture performed. Will re-evaluate tomorrow and d/w pt options.   Electronic Signatures: Gwyneth Revels (MD)  (Signed 24-Jun-15 16:30)  Authored: Health Issues, Significant Events - History, Home Medications, Labs, Allergies, Vital Signs, General Aspect/Present Illness, History and Physical Exam, Impression/Plan   Last Updated: 24-Jun-15 16:30  by Gwyneth Revels (MD)

## 2015-02-13 ENCOUNTER — Inpatient Hospital Stay
Admit: 2015-02-13 | Disposition: A | Discharge status: Home / Self Care | Payer: Self-pay | Attending: Internal Medicine | Admitting: Internal Medicine

## 2015-02-13 DIAGNOSIS — I340 Nonrheumatic mitral (valve) insufficiency: Secondary | ICD-10-CM

## 2015-02-13 LAB — CBC
HCT: 34.1 % — AB (ref 40.0–52.0)
HGB: 11.4 g/dL — ABNORMAL LOW (ref 13.0–18.0)
MCH: 30.2 pg (ref 26.0–34.0)
MCHC: 33.5 g/dL (ref 32.0–36.0)
MCV: 90 fL (ref 80–100)
Platelet: 200 10*3/uL (ref 150–440)
RBC: 3.78 10*6/uL — AB (ref 4.40–5.90)
RDW: 13.1 % (ref 11.5–14.5)
WBC: 3.2 10*3/uL — ABNORMAL LOW (ref 3.8–10.6)

## 2015-02-13 LAB — COMPREHENSIVE METABOLIC PANEL
ALBUMIN: 3.5 g/dL
ALK PHOS: 63 U/L
ALT: 19 U/L
ANION GAP: 8 (ref 7–16)
BILIRUBIN TOTAL: 0.6 mg/dL
BUN: 13 mg/dL
CO2: 27 mmol/L
CREATININE: 1.45 mg/dL — AB
Calcium, Total: 8.8 mg/dL — ABNORMAL LOW
Chloride: 90 mmol/L — ABNORMAL LOW
EGFR (African American): >60
EGFR (Non-African Amer.): 52 — ABNORMAL LOW
Glucose: 125 mg/dL — ABNORMAL HIGH
POTASSIUM: 4.7 mmol/L
SGOT(AST): 30 U/L
SODIUM: 125 mmol/L — AB
Total Protein: 6.4 g/dL — ABNORMAL LOW

## 2015-02-13 LAB — TROPONIN I
TROPONIN-I: 0.05 ng/mL — AB
Troponin-I: 0.04 ng/mL — ABNORMAL HIGH
Troponin-I: 0.04 ng/mL — ABNORMAL HIGH

## 2015-02-13 LAB — PRO B NATRIURETIC PEPTIDE: B-TYPE NATIURETIC PEPTID: 414 pg/mL — AB

## 2015-02-14 ENCOUNTER — Other Ambulatory Visit: Payer: Self-pay | Admitting: Physician Assistant

## 2015-02-14 DIAGNOSIS — F1010 Alcohol abuse, uncomplicated: Secondary | ICD-10-CM | POA: Diagnosis not present

## 2015-02-14 DIAGNOSIS — I10 Essential (primary) hypertension: Secondary | ICD-10-CM

## 2015-02-14 DIAGNOSIS — I5023 Acute on chronic systolic (congestive) heart failure: Secondary | ICD-10-CM

## 2015-02-14 LAB — CBC WITH DIFFERENTIAL/PLATELET
BASOS PCT: 0.4 %
Basophil #: 0 10*3/uL (ref 0.0–0.1)
EOS PCT: 1.7 %
Eosinophil #: 0.1 10*3/uL (ref 0.0–0.7)
HCT: 32.2 % — ABNORMAL LOW (ref 40.0–52.0)
HGB: 11.3 g/dL — AB (ref 13.0–18.0)
Lymphocyte #: 1 10*3/uL (ref 1.0–3.6)
Lymphocyte %: 34.5 %
MCH: 31.3 pg (ref 26.0–34.0)
MCHC: 35.1 g/dL (ref 32.0–36.0)
MCV: 89 fL (ref 80–100)
Monocyte #: 0.5 x10 3/mm (ref 0.2–1.0)
Monocyte %: 16 %
NEUTROS PCT: 47.4 %
Neutrophil #: 1.4 10*3/uL (ref 1.4–6.5)
Platelet: 208 10*3/uL (ref 150–440)
RBC: 3.61 10*6/uL — AB (ref 4.40–5.90)
RDW: 13.1 % (ref 11.5–14.5)
WBC: 2.9 10*3/uL — ABNORMAL LOW (ref 3.8–10.6)

## 2015-02-14 LAB — BASIC METABOLIC PANEL
ANION GAP: 7 (ref 7–16)
BUN: 15 mg/dL
CHLORIDE: 93 mmol/L — AB
Calcium, Total: 8.5 mg/dL — ABNORMAL LOW
Co2: 27 mmol/L
Creatinine: 1.47 mg/dL — ABNORMAL HIGH
EGFR (Non-African Amer.): 51 — ABNORMAL LOW
GFR CALC AF AMER: 59 — AB
GLUCOSE: 121 mg/dL — AB
POTASSIUM: 3.9 mmol/L
Sodium: 127 mmol/L — ABNORMAL LOW

## 2015-02-15 DIAGNOSIS — I5023 Acute on chronic systolic (congestive) heart failure: Secondary | ICD-10-CM | POA: Diagnosis not present

## 2015-02-15 DIAGNOSIS — I10 Essential (primary) hypertension: Secondary | ICD-10-CM | POA: Diagnosis not present

## 2015-02-15 DIAGNOSIS — F1010 Alcohol abuse, uncomplicated: Secondary | ICD-10-CM | POA: Diagnosis not present

## 2015-02-15 LAB — BASIC METABOLIC PANEL
ANION GAP: 6 — AB (ref 7–16)
BUN: 18 mg/dL
CALCIUM: 8.7 mg/dL — AB
Chloride: 91 mmol/L — ABNORMAL LOW
Co2: 29 mmol/L
Creatinine: 1.66 mg/dL — ABNORMAL HIGH
EGFR (African American): 51 — ABNORMAL LOW
EGFR (Non-African Amer.): 44 — ABNORMAL LOW
GLUCOSE: 157 mg/dL — AB
Potassium: 4.1 mmol/L
SODIUM: 126 mmol/L — AB

## 2015-02-16 LAB — BASIC METABOLIC PANEL
Anion Gap: 6 — ABNORMAL LOW (ref 7–16)
BUN: 20 mg/dL
CREATININE: 1.56 mg/dL — AB
Calcium, Total: 9.3 mg/dL
Chloride: 98 mmol/L — ABNORMAL LOW
Co2: 29 mmol/L
EGFR (African American): 55 — ABNORMAL LOW
EGFR (Non-African Amer.): 48 — ABNORMAL LOW
Glucose: 132 mg/dL — ABNORMAL HIGH
POTASSIUM: 4.3 mmol/L
SODIUM: 130 mmol/L — AB

## 2015-02-19 NOTE — H&P (Signed)
PATIENT NAME:  Maurice Jordan, Maurice Jordan MR#:  [id number] DATE OF BIRTH:  1956-03-26  DATE OF ADMISSION:  02/13/2015  PRIMARY CARE PHYSICIAN:  Dr. Francesco Sor in [facility].   CHIEF COMPLAINT: Shortness of breath, lower extremity swelling.   HISTORY OF PRESENTING ILLNESS: A 59 year old African-American male patient with history of alcohol abuse, hypertension, insulin-dependent diabetes mellitus, presents to the Emergency Room complaining of 1 week of worsening shortness of breath and lower extremity edema. He complains of orthopnea, has been sleeping with 2 pillows. Has had good urine output. No nausea, vomiting, chest pain, abdominal pain. He has continued to drink about 3-4, 40 ounce beers daily.   He does not have any cardiac history or lung problems. Does not smoke.   Here in the Emergency Room the patient has been found to have significant hyponatremia of 125 along with shortness of breath, although saturations are normal with CHF in chest x-ray, new onset congestive heart failure, and is being admitted to the hospitalist service. Also troponin is elevated at 0.04.   REVIEW OF SYSTEMS:    CONSTITUTIONAL: Complains of fatigue, weight gain.  EYES: No blurred vision, pain, or redness.  EARS, NOSE, AND THROAT: No tinnitus, ear pain, hearing loss.  RESPIRATORY: No cough or wheeze. Has shortness of breath.  CARDIOVASCULAR: No chest pain. Has edema, orthopnea.   GASTROINTESTINAL:  No nausea, vomiting, diarrhea, abdominal pain.   GENITOURINARY:  No dysuria, hematuria, or frequency.  ENDOCRINE: No polyuria, nocturia, thyroid problems.  HEMATOLOGIC AND LYMPHATIC: No anemia, easy bruising, bleeding.  INTEGUMENTARY: No acne, rash, lesion.  MUSCULOSKELETAL: No back pain or arthritis.  NEUROLOGIC: No focal numbness, weakness.  PSYCHIATRIC:  No anxiety or depression.   PAST MEDICAL HISTORY:  1. Insulin-dependent diabetes.   2. Hypertension.  3. Diabetic neuropathy.  4. Alcohol abuse.  5. Hyperlipidemia.   6. Depression.  7. History of right 3rd toe osteomyelitis.   ALLERGIES: No known drug allergies.   SOCIAL HISTORY: The patient does not smoke. Drinks 3-4, 40 ounce beers a day. No illicit drug use. Lives with his girlfriend. Ambulates on his own. Does not work.   FAMILY HISTORY: Father died in a car accident. Diabetes runs in his family.   HOME MEDICATIONS: Unknown at this time, but he does say that he takes aspirin daily and insulin Lantus of 10 units daily.   PHYSICAL EXAMINATION:  VITAL SIGNS: Temperature 98.7, pulse of 84, blood pressure 149/96, saturating 95% on room air.  GENERAL: Obese African-American male patient lying in bed with conversational dyspnea.  PSYCHIATRIC: Alert and oriented x 3. Has a flat affect.  HEENT: Atraumatic, normocephalic. Oral mucosa moist and pink. External ears and nose normal. No pallor. No icterus.  NECK: Supple. No thyromegaly. No palpable lymph nodes. Trachea midline. No carotid bruit or JVD.  CARDIOVASCULAR: S1, S2. No murmurs. Peripheral pulses 2 + with 2 + edema.  RESPIRATORY: Has some rhonchi in the right upper lobe. No crackles heard. Good air entry on both sides.  GASTROINTESTINAL: Soft abdomen, nontender. Bowel sounds present. No hepatosplenomegaly palpable.  GENITOURINARY: No CVA tenderness or bladder distention.  SKIN: Warm and dry. No petechiae, rash, ulcers.  MUSCULOSKELETAL: No joint swelling, redness, effusion of the large joints. Normal muscle tone.  NEUROLOGICAL: Motor strength 5/5 in upper and lower extremities. Sensation to light touch intact all over.  LYMPHATIC: No cervical lymphadenopathy.   LABORATORY STUDIES: Show a glucose of 125 with BUN 13, creatinine 1.45, sodium 125, potassium 4.7, chloride 90, bicarbonate of 27. GFR  greater than 60. Anion gap 8. Calcium 8.8. AST, ALT, alkaline phosphatase, bilirubin normal. Troponin 0.04.   WBC 8.2, hemoglobin 11.4, platelets of 200,000.   EKG shows normal sinus rhythm with  anterolateral Q waves.   Chest x-ray shows mild CHF.    ASSESSMENT AND PLAN:  1.  New onset congestive heart failure. The patient presently has pulmonary edema on his chest x-ray, mildly elevated troponin. It is unclear if this is non-ST segment elevation myocardial infarction, but most likely secondary to his congestive heart failure. We will not start any heparin or Lovenox as he does not have any chest pain, but if there is significant increase in the troponin he will need anticoagulation and cardiology consult for cardiac catheterization. At this point he will be started on IV Lasix 40 b.i.d. along with monitoring Os and Os, daily weight. The patient does have orthopnea and lower extremity edema with hypervolemic hyponatremia with sodium of 125, which needs to be monitored. We will also get an echocardiogram. The patient will be on a telemetry floor.  2.  Shortness of breath secondary to congestive heart failure, no acute respiratory failure found. 3.  Hypertension, uncontrolled. It is unclear if the patient has been taking his medications, also be do not have his home medication list. He was on Coreg in the past during his last discharge and will be started on Coreg 6.25 b.i.d.  We will use IV p.r.n. medications. With IV Lasix his blood pressure should improve.   4.  Insulin-dependent diabetes.  He takes 10 units of Lantus at home. We will put him on 8 units here in the hospital along with slip sliding scale insulin.   5.  Alcohol abuse. Counseled the patient to quit. We will put him on CIWA protocol as he is high risk for withdrawals.  6.  Deep vein thrombosis prophylaxis with Lovenox.   CODE STATUS: Full code.   TIME SPENT TODAY ON THIS CASE: 45 minutes.     ____________________________ Molinda Bailiff Kailea Dannemiller, MD srs:bu D: 02/13/2015 13:07:14 ET T: 02/13/2015 13:36:21 ET JOB#: [id number]  cc: Wardell Heath R. Risa Auman, MD, <Dictator> [facility] Dr. Alwyn Ren MD ELECTRONICALLY SIGNED  02/14/2015 10:10

## 2015-02-19 NOTE — Consult Note (Signed)
General Aspect Primary Cardiologist: Patient initially reported [facility] then recanted this statement, which correlates with Care Everywhere, stating he only following with his Brand Males MD PCP: Dr. Buford Dresser, MD [facility]) ________________  59 year old male with history of chronic systolic CHF, IDDM, HTN, ongoing alcohol abuse, HLD, diabetic neuropathy, depression, history of third toe osteomyelitis, and remote history of polysubstance abuse (MJ and cocaine) who presented to [facility] on 4/25 with a 10 day history of increaed dyspnea and bilateral lower extremity edema.  _______________  PMH: 1. Chronic systolic CHF 2. IDDM 3. HTN 4. Ongoing alcohol abuse 5. HLD 6. Diabetic neurpathy 7. Depression 8. History of third toe osteomyelitis 9. Remote history of polysubstance abuse (MJ and cocaine) 10. History of syncope (attributed to exhaust pipe in his living room) ________________   Present Illness 59 year old male with the above problem list who presented to [facility] on 4/25 with the above CC.  He has known chronic systolic CHF which is apparently followed by his PCP according to the patient (though he is unreliable as his account of this has changed this morning). Per [facility] he last underwent nuclear stress test in 2006 that showed a small subtle fixed defect involving the apical septal wall. There was moderate diaphragmatic attenuation. Nuclear Wall Motion Findings: Global systolic function was normal.  EF estiamted at 65-70%. Impression: There was a small subtle fixed defect involving the apical septal wall. Global systolic function was normal. The EF was estimated to be between 65 to 70%. He denies ever having had a cardiac cath. He last underwent echo at [facility] 07/2013 in the setting weakness that showed EF of 49-44%, diastolic left ventricular dysfunction, and a small pericardial effusion. Weakness ultimatel attributed to hyponatremia. Remaining work up included carotid dopplers and head  CT.  He presented to [facility] on 4/25 with a 10 day history of increased dyspnea and bilateral LEE. No chest pain. Has been sleeping with 2 pillows, usually slees with 1 pillow. Baseline weight is 167, he presented with weight of 187. He eats a lot of salt. He drinks at least 4 forty oz beers weekly. No smoking. Remote history of MJ and cocaine in his 4s.   Upon his arrival to [facility] he was found to ahve a pBNP of 441, echo showed EF 30-35%, severe anterior, anteroseptal, and apical wall HK, mild LVH, DD, mild to moderate MR. Na 125-->127, troponin 0.04-->0.04-->0.05, WBC count 3.2-->2.7, hgb 11.4, plt 200, CXR mild CHF. He was started on IV Lasix 40 mg bid. He notes improvement in his breathing and LEE.   Physical Exam:  GEN well developed, no acute distress   HEENT PERRL, hearing intact to voice, moist oral mucosa   NECK supple  trachea midline  no JVD   RESP normal resp effort  faint crackles   CARD Regular rate and rhythm  Murmur   Murmur Systolic  2/6 apex   ABD denies tenderness  soft   LYMPH negative neck   EXTR 1+ no-pitting edema bilateral lower extremities to the mid shin   SKIN normal to palpation   NEURO cranial nerves intact, motor/sensory function intact   PSYCH alert   Review of Systems:  Subjective/Chief Complaint SOB   General: Fatigue  Weakness   Skin: No Complaints   ENT: No Complaints   Eyes: No Complaints   Neck: No Complaints   Respiratory: Frequent cough  Short of breath  Wheezing   Cardiovascular: Dyspnea  Orthopnea  Edema  Gastrointestinal: No Complaints   Genitourinary: No Complaints   Vascular: No Complaints   Musculoskeletal: No Complaints   Neurologic: No Complaints   Hematologic: No Complaints   Endocrine: No Complaints   Psychiatric: No Complaints   Review of Systems: All other systems were reviewed and found to be negative   Medications/Allergies Reviewed Medications/Allergies reviewed   Family & Social History:   Family and Social History:  Family History Coronary Artery Disease  Hypertension  Diabetes Mellitus   Social History negative tobacco, positive ETOH, negative Illicit drugs   + Tobacco drinks at least 4 forty oz beers weekly, h/o MJ and cocaine in his 37s   Place of Living Home     Multi-drug Resistant Organism (MDRO): Positive culture for MRSA., 15-Apr-2014   Hypercholesterolemia:    Depression:    Hypertension:    Diabetes:        Admit Diagnosis:   CHF: Onset Date: 14-Feb-2015, Status: Active, Description: CHF  Home Medications: Medication Instructions Status  gabapentin 300 mg oral capsule 3 cap(s) orally once a day (at bedtime) Active  Lantus Solostar Pen 100 units/mL subcutaneous solution 10 unit(s) subcutaneous once a day (at bedtime) Active  lisinopril 20 mg oral tablet 1 tab(s) orally once a day (at bedtime) Active  Aspirin Enteric Coated 81 mg oral delayed release tablet 1 tab(s) orally once a day (in the morning) Active  amLODIPine 10 mg oral tablet 1 tab(s) orally once a day (at bedtime) Active  citalopram 20 mg oral tablet 1 tab(s) orally once a day (in the morning) Active  tamsulosin 0.4 mg oral capsule 1 cap(s) orally once a day Active  carvedilol 3.125 mg oral tablet 1 tab(s) orally 2 times a day Active  atorvastatin 40 mg oral tablet 2 tab(s) orally once a day (at bedtime) Active  chlorthalidone 25 mg oral tablet 1 tab(s) orally once a day Active   Lab Results:  Routine Chem:  26-Apr-16 05:09   Glucose, Serum  121 (65-99 NOTE: New Reference Range  12/27/14)  BUN 15 (6-20 NOTE: New Reference Range  12/27/14)  Creatinine (comp)  1.47 (0.61-1.24 NOTE: New Reference Range  12/27/14)  Sodium, Serum  127 (135-145 NOTE: New Reference Range  12/27/14)  Potassium, Serum 3.9 (3.5-5.1 NOTE: New Reference Range  12/27/14)  Chloride, Serum  93 (101-111 NOTE: New Reference Range  12/27/14)  CO2, Serum 27 (22-32 NOTE: New Reference Range  12/27/14)   Calcium (Total), Serum  8.5 (8.9-10.3 NOTE: New Reference Range  12/27/14)  Anion Gap 7  eGFR (African American)  59  eGFR (Non-African American)  51 (eGFR values <43m/min/1.73 m2 may be an indication of chronic kidney disease (CKD). Calculated eGFR is useful in patients with stable renal function. The eGFR calculation will not be reliable in acutely ill patients when serum creatinine is changing rapidly. It is not useful in patients on dialysis. The eGFR calculation may not be applicable to patients at the low and high extremes of body sizes, pregnant women, and vegetarians.)  Cardiac:  25-Apr-16 13:43   Troponin I  0.04 (0.00-0.03 0.03 ng/mL or less: NEGATIVE  Repeat testing in 3-6 hrs  if clinically indicated. >0.05 ng/mL: POTENTIAL  MYOCARDIAL INJURY. Repeat  testing in 3-6 hrs if  clinically indicated. NOTE: An increase or decrease  of 30% or more on serial  testing suggests a  clinically important change NOTE: New Reference Range  12/27/14)    17:20   Troponin I  0.05 (0.00-0.03 0.03 ng/mL or less: NEGATIVE  Repeat testing in 3-6 hrs  if clinically indicated. >0.05 ng/mL: POTENTIAL  MYOCARDIAL INJURY. Repeat  testing in 3-6 hrs if  clinically indicated. NOTE: An increase or decrease  of 30% or more on serial  testing suggests a  clinically important change NOTE: New Reference Range  12/27/14)  Routine Hem:  26-Apr-16 05:09   WBC (CBC)  2.9  RBC (CBC)  3.61  Hemoglobin (CBC)  11.3  Hematocrit (CBC)  32.2  Platelet Count (CBC) 208  MCV 89  MCH 31.3  MCHC 35.1  RDW 13.1  Neutrophil % 47.4  Lymphocyte % 34.5  Monocyte % 16.0  Eosinophil % 1.7  Basophil % 0.4  Neutrophil # 1.4  Lymphocyte # 1.0  Monocyte # 0.5  Eosinophil # 0.1  Basophil # 0.0 (Result(s) reported on 14 Feb 2015 at 05:42AM.)   EKG:  EKG Interp. by me   Interpretation EKG shows NSR, 86 bpm, left axis deviation, TWI V2-V5, st sagging I and V6   Radiology Results: XRay:     25-Apr-16 11:00, Chest PA and Lateral  Chest PA and Lateral   REASON FOR EXAM:    SOB  COMMENTS:       PROCEDURE: DXR - DXR CHEST PA (OR AP) AND LATERAL  - Feb 13 2015 11:00AM     CLINICAL DATA:  Short of breath. Initial encounter. Onset of  symptoms last night.    EXAM:  CHEST  2 VIEW    COMPARISON:  08/07/2014.    FINDINGS:  Cardiopericardial silhouette within normal limits for projection.  There is pulmonary vascular congestion which is a new finding  compared to the prior chest radiograph. Basilar atelectasis. Small  bilateral pleural effusions are evident with blunting of the  costophrenic angles on the lateral view. No definite alveolar edema.     IMPRESSION:  Findings consistent with mild CHF.      Electronically Signed    By: Dereck Ligas M.D.    On: 02/13/2015 11:16         Verified By: Melvyn Novas, M.D.,  Cardiology:    25-Apr-16 13:53, Echo Doppler  Echo Doppler   REASON FOR EXAM:      COMMENTS:       PROCEDURE: [place] - ECHO DOPPLER COMPLETE(TRANSTHOR)  - Feb 13 2015  1:53PM     RESULT: Echocardiogram Report    Patient Name:   Maurice Jordan Date of Exam: 02/13/2015  Medical Rec #:  [id number]         Custom1:  Date of Birth:  Nov 10, 1955      Height:       66.0 in  Patient Age:    58 years       Weight:       170.0 lb  Patient Gender: M              BSA:          1.87 m??    Indications: CHF  Sonographer:    Sherrie Sport RDCS  Referring Phys: Harvest Dark, A    Sonographer Comments: Suboptimal parasternal window.    Summary:   1. Left ventricular ejection fraction, by visual estimation, is 30 to   35%.   2. Moderately to severely decreased global left ventricular systolic   function.   3. Severe anterior, anteroseptal and apical hypokinesis.   4. Mild concentric left ventricular hypertrophy.   5. Pseudonormal pattern of LV diastolic filling.   6. Mild to moderately increased left ventricular  internal cavity size.   7. Moderately dilated  left atrium.   8. Mild to moderate mitral valve regurgitation.  2D AND M-MODE MEASUREMENTS (normal ranges within parentheses):  Left Ventricle:          Normal  IVSd (2D):      1.25 cm (0.7-1.1)  LVPWd (2D):     1.25 cm (0.7-1.1) Aorta/LA:                  Normal  LVIDd (2D):     5.60 cm (3.4-5.7) Aortic Root (2D): 2.70 cm (2.4-3.7)  LVIDs (2D):     4.85 cm           Left Atrium (2D): 4.50 cm (1.9-4.0)  LV FS (2D):     13.4 %   (>25%)  LV EF (2D):     28.3 %   (>50%)                                    Right Ventricle:                                    RVd (2D):        1.02 cm  LV SYSTOLIC FUNCTION BY 2D PLANIMETRY (MOD):  EF-A4C View: 58.5 %  LV DIASTOLIC FUNCTION:  MV Peak E: 0.96 m/s E/e' Ratio: 18.60  MV Peak A: 0.84 m/s Decel Time: 127 msec  E/A Ratio: 1.14  SPECTRAL DOPPLER ANALYSIS (where applicable):  Mitral Valve:  MV P1/2 Time: 36.83 msec  MV Area, PHT: 5.97 cm??  Aortic Valve: AoV Max Vel: 0.95 m/s AoV Peak PG: 3.6 mmHg AoV Mean PG:  LVOT Vmax: 0.61 m/s LVOT VTI:  LVOT Diameter: 2.00 cm  AoV Area, Vmax: 2.03 cm?? AoV Area, VTI:  AoV Area, Vmn:  Tricuspid Valve and PA/RV Systolic Pressure: TR Max Velocity: 2.41 m/s RA   Pressure: 5 mmHg RVSP/PASP: 28.2 mmHg  Pulmonic Valve:  PV Max Velocity: 0.61 m/s PV Max PG: 1.5 mmHg PV Mean PG:    PHYSICIAN INTERPRETATION:  Left Ventricle: The left ventricular internal cavity size was mild to     moderately increased. Mild concentric left ventricular hypertrophy.   Global LV systolic function was moderately to severely decreased. Left   ventricular ejection fraction, by visual estimation, is 30 to 35%.   Spectral Doppler shows pseudonormal pattern of LV diastolic filling.  Right Ventricle: Normal right ventricular size, wall thickness, and   systolic function.  Left Atrium: The left atrium is moderately dilated.  Right Atrium: The rightatrium is normal in size and structure.  Pericardium: There is no evidence of pericardial  effusion.  Mitral Valve: The mitral valve is normal in structure. No evidence of   mitral valve stenosis. Mild to moderate mitral valve regurgitation is   seen.  Tricuspid Valve: The tricuspid valve is normal. Trivial tricuspid   regurgitation is visualized. The tricuspid regurgitant velocity is 2.41   m/s, and with an assumed right atrial pressure of 5 mmHg, the estimated     right ventricular systolic pressure is normal at 28.2 mmHg.  Aortic Valve: The aortic valve was not well seen. The aortic valve is   structurally normal, with no evidence of sclerosis or stenosis. No   evidence of aortic valve regurgitation isseen.  Pulmonic Valve: The pulmonic valve is not well seen.  Aorta: The aorta is not well seen.  Venous: The inferior vena cava was not well visualized.    [id number] Kathlyn Sacramento MD  Electronically signed by [id number] Kathlyn Sacramento MD  Signature Date/Time: 02/13/2015/5:56:26 PM    *** Final ***    IMPRESSION: .    Verified By: Mertie Clause. Fletcher Anon, M.D., MD    No Known Allergies:   Vital Signs/Nurse's Notes: **Vital Signs.:   26-Apr-16 11:25  Vital Signs Type Routine  Temperature Temperature (F) 98.1  Celsius 36.7  Temperature Source oral  Pulse Pulse 78  Respirations Respirations 18  Systolic BP Systolic BP 941  Diastolic BP (mmHg) Diastolic BP (mmHg) 68  Mean BP 79  Pulse Ox % Pulse Ox % 95  Pulse Ox Activity Level  At rest  Oxygen Delivery Room Air/ 21 %    Impression 59 year old male with history of chronic systolic CHF, IDDM, HTN, ongoing alcohol abuse, HLD, diabetic neuropathy, depression, history of third toe osteomyelitis, and remote history of polysubstance abuse (MJ and cocaine) who presented to [facility] on 4/25 with a 10 day history of increased dyspnea and bilateral lower extremity edema.   1. Acute on chronic systolic CHF: -Echo shows EF 30-35% (prior EF 35-40% by echo 07/2013), anterior, anteroseptal, and apical wall HK (history of small subtle fixed defect  involving the apical septal wall dating back to stress test 2006) -Continue IV Lasix 40 mg bid -Coreg 6.25 mg bid -Add lisinopril 2.5 mg daily when SCr is seen to be stable and BP allows -Given increased wall motion abnormalities he could benefit from an ischemic evaluation  ----consider outpt stress given on contact precautions for MRSA and this would affect the nuc lab, and given longstanding depressed EF and no angina, there is no urgency -HF could also induced by HTN and ETOH   2. HTN: -Controlled -Continue current medications as above  3. Alcohol abuse: -CIWA -Cessation is a must    4. IDDM: -Per IM  5. Hyponatremia: -Received fluids -? tolvaptan if no improvement -Per IM   Electronic Signatures: Rise Mu (PA-C)  (Signed 26-Apr-16 12:26)  Authored: General Aspect/Present Illness, History and Physical Exam, Review of System, Family & Social History, Past Medical History, Home Medications, Labs, EKG , Radiology, Allergies, Vital Signs/Nurse's Notes, Impression/Plan Ida Rogue (MD)  (Signed 26-Apr-16 19:39)  Authored: General Aspect/Present Illness, History and Physical Exam, Review of System, Health Issues, Labs, EKG , Impression/Plan  Co-Signer: General Aspect/Present Illness, Home Medications, Allergies   Last Updated: 26-Apr-16 19:39 by Ida Rogue (MD)

## 2015-02-19 NOTE — Discharge Summary (Addendum)
PATIENT NAME:  Jordan, Maurice MR#:  [id number] DATE OF BIRTH:  09-23-1956  DATE OF ADMISSION:  02/13/2015 DATE OF DISCHARGE:  02/16/2015  DISCHARGE DIAGNOSES:  Acute exacerbation of congestive heart failure, hyponatremia.  CODE STATUS: Full code.  MEDICATIONS AT THE TIME OF DISCHARGE: Gabapentin 300 mg 3 capsules p.o. once daily at bedtime, Lantus 10 units subcutaneously once daily at bedtime, aspirin 81 mg enteric-coated p.o. once daily in a.m., citalopram 20 mg 1 capsule p.o. once daily in a.m., tamsulosin 0.4 mg 1 capsule p.o. once daily, atorvastatin 40 mg 2 tablets p.o. once daily at bedtime, chlorthalidone 25 mg 1 tablet p.o. once daily, Tylenol 325 mg 2 tablets p.o. every 4 hours as needed for mild pain, lisinopril 20 mg 1 tablet p.o. once daily at bedtime, Coreg 6.25 mg 1 tablet p.o. 2 times a day, thiamine 100 mg p.o. once daily, Colace 100 mg 1 capsule p.o. 2 times a day as needed for constipation, furosemide 20 mg 1 tablet p.o. 2 times a day, Klor-Con 10 mEq 1 tablet p.o. once daily. Discontinued amlodipine.  HOME HEALTH SERVICES: Discharged home with home health with RN and Child psychotherapist.  DIET: Low-fat, low-sodium, carbohydrate-controlled, regular consistency.   ACTIVITY: As tolerated. No heavy lifting.   FOLLOWUP: Follow up with primary care physician in 2 weeks and Dr. Mariah Milling in 1 week, and the patient needs outpatient stress test by Dr. Mariah Milling.  BRIEF HISTORY AND PHYSICAL AND HOSPITAL COURSE: The patient is a 59 year old African American male who came into the ED with a chief complaint of shortness of breath and lower extremity swelling. The patient was complaining of 1-day history of worsening of shortness of breath and was sleeping on 2 pillows. His sodium was found to be at 125. The patient was diagnosed with new-onset congestive heart failure. Please review history and physical for details. Admitted to the hospital under hospitalist service.   Hospital course based on the  problem:  1.  New-onset congestive heart failure with shortness of breath. The patient was admitted to the hospital to telemetry. Initial troponin was elevated at 0.04. His troponins were recycled. Cardiology consult was placed for possible cardiac catheterization. The patient was placed on IV Lasix 40 mg twice a day and his intakes and outputs were monitored closely. An echocardiogram was ordered. The patient's condition was significantly improved with Lasix IV and that was changed to Lasix p.o. The patient was found to be hyponatremic and echocardiogram has revealed ejection fraction of 35%. Renal function was monitored and it was getting worse. Lasix IV was changed to p.o. Lisinopril was also held. Clinical situation significantly improved and from cardiology standpoint the patient was discharged home under satisfactory condition. He was recommended to follow up with outpatient congestive heart clinic.  2.  Hyponatremia. It was worse with IV Lasix. The patient was given 2 doses of tolvaptan and his sodium significantly improved and today it is at 130 and he received the last dose of tolvaptan today before discharge.  3.  Hypertension. The plan is to continue Lasix and Coreg. Lisinopril can be started from Saturday after holding it for 2 days in view of renal insufficiency. The patient's amlodipine is discontinued.  4.  Diabetes mellitus. Resume his home dose of Lantus. The patient is to follow up with primary care physician regarding fine-tuning of his Lantus dosing and also outpatient followup with diabetic clinic.  5.  For benign prostatic hypertrophy, the patient is to continue Flomax and continued on Neurontin for neuropathy.  Overall, his condition is significantly improved. Lower extremity swelling was resolved and the patient was discharged home under satisfactory condition.   DISCHARGE PHYSICAL EXAMINATION:  VITAL SIGNS: Today, temperature 97.6, pulse 75, respirations 18, blood pressure 127/87,  pulse oximetry 97% on room air.  GENERAL APPEARANCE: Not in any acute distress. Moderately built and nourished.  HEENT: Normocephalic, atraumatic. Pupils are equally reactive to light and accommodation. No scleral icterus. No conjunctival injection. No sinus tenderness. No postnasal drip. Moist mucous membranes.  NECK: Supple. No JVD. No thyromegaly. Range of motion is intact.  LUNGS: Minimal rales at the bases; otherwise good air entry. No anterior chest wall tenderness.  CARDIOVASCULAR: S1, S2, regular rate and rhythm. Positive murmur.  GASTROINTESTINAL: Soft. Bowel sounds are positive in all 4 quadrants. Nontender, nondistended. No masses felt.  NEUROLOGIC: Awake, alert, oriented x 3. Cranial nerves II through XII are grossly intact. Motor and sensory are intact. Reflexes are 2+.  EXTREMITIES: 1+ pitting edema is present. No cyanosis. No clubbing.  SKIN: Warm to touch. Normal turgor. No rashes. No lesions.  MUSCULOSKELETAL: No joint effusion, tenderness, or edema.  PSYCHIATRIC: Normal mood and affect.  LABORATORY AND IMAGING STUDIES: Accu-Cheks: 127, 182. Glucose today is at 132. BUN 20, creatinine 1.56, exudate was at 1.66. Sodium is at 130 today; potassium normal. GFR 55. Anion gap is 6. Calcium 9.3.   ECHOCARDIOGRAM: Systolic ejection fraction 30% to 35%, moderate to severe decreased global left ventricular systolic function, severe anterior septal and apical hypokinesis as well as anterior hypokinesis. Mild concentric left ventricular hypertrophy. Showed a normal pattern of LV diastolic filling. Mild to moderate mitral valve regurgitation. Moderately dilated left atrium.   The diagnosis and plan of care was discussed in detail with the patient. His questions were answered. He is to follow up with Dr. Mariah Milling as an outpatient for stress test and he was discharged home with home health.   Outpatient followup with CHF clinic and diabetes clinic is recommended. The patient needs repeat BMP in a  week when he follows up with Dr. Mariah Milling.   TOTAL TIME SPENT ON DISCHARGE: 45 minutes.   ____________________________ Ramonita Lab, MD ag:ST D: 02/16/2015 15:52:32 ET T: 02/16/2015 23:27:39 ET JOB#: [id number]  cc: Ramonita Lab, MD, <Dictator> Antonieta Iba, MD Primary care physician Ramonita Lab MD ELECTRONICALLY SIGNED 03/01/2015 14:20

## 2015-03-09 ENCOUNTER — Ambulatory Visit: Payer: Medicaid Other | Admitting: Family

## 2015-03-11 ENCOUNTER — Inpatient Hospital Stay
Admission: EM | Admit: 2015-03-11 | Discharge: 2015-03-15 | DRG: 682 | Disposition: A | Discharge status: Home / Self Care | Payer: Medicaid Other | Attending: Internal Medicine | Admitting: Internal Medicine

## 2015-03-11 ENCOUNTER — Emergency Department: Payer: Medicaid Other

## 2015-03-11 ENCOUNTER — Encounter: Payer: Self-pay | Admitting: Emergency Medicine

## 2015-03-11 DIAGNOSIS — N179 Acute kidney failure, unspecified: Principal | ICD-10-CM | POA: Diagnosis present

## 2015-03-11 DIAGNOSIS — G9341 Metabolic encephalopathy: Secondary | ICD-10-CM | POA: Diagnosis present

## 2015-03-11 DIAGNOSIS — E871 Hypo-osmolality and hyponatremia: Secondary | ICD-10-CM | POA: Diagnosis present

## 2015-03-11 DIAGNOSIS — Z833 Family history of diabetes mellitus: Secondary | ICD-10-CM

## 2015-03-11 DIAGNOSIS — R159 Full incontinence of feces: Secondary | ICD-10-CM | POA: Diagnosis present

## 2015-03-11 DIAGNOSIS — R32 Unspecified urinary incontinence: Secondary | ICD-10-CM | POA: Diagnosis present

## 2015-03-11 DIAGNOSIS — Z9114 Patient's other noncompliance with medication regimen: Secondary | ICD-10-CM | POA: Diagnosis present

## 2015-03-11 DIAGNOSIS — I129 Hypertensive chronic kidney disease with stage 1 through stage 4 chronic kidney disease, or unspecified chronic kidney disease: Secondary | ICD-10-CM | POA: Diagnosis present

## 2015-03-11 DIAGNOSIS — Z9111 Patient's noncompliance with dietary regimen: Secondary | ICD-10-CM | POA: Diagnosis present

## 2015-03-11 DIAGNOSIS — F1410 Cocaine abuse, uncomplicated: Secondary | ICD-10-CM | POA: Diagnosis present

## 2015-03-11 DIAGNOSIS — G319 Degenerative disease of nervous system, unspecified: Secondary | ICD-10-CM | POA: Diagnosis present

## 2015-03-11 DIAGNOSIS — Z8614 Personal history of Methicillin resistant Staphylococcus aureus infection: Secondary | ICD-10-CM

## 2015-03-11 DIAGNOSIS — R7989 Other specified abnormal findings of blood chemistry: Secondary | ICD-10-CM | POA: Diagnosis present

## 2015-03-11 DIAGNOSIS — F1910 Other psychoactive substance abuse, uncomplicated: Secondary | ICD-10-CM | POA: Diagnosis present

## 2015-03-11 DIAGNOSIS — F10129 Alcohol abuse with intoxication, unspecified: Secondary | ICD-10-CM | POA: Diagnosis present

## 2015-03-11 DIAGNOSIS — N183 Chronic kidney disease, stage 3 unspecified: Secondary | ICD-10-CM | POA: Diagnosis present

## 2015-03-11 DIAGNOSIS — E119 Type 2 diabetes mellitus without complications: Secondary | ICD-10-CM

## 2015-03-11 DIAGNOSIS — I5022 Chronic systolic (congestive) heart failure: Secondary | ICD-10-CM | POA: Diagnosis present

## 2015-03-11 DIAGNOSIS — M6282 Rhabdomyolysis: Secondary | ICD-10-CM | POA: Diagnosis present

## 2015-03-11 DIAGNOSIS — F329 Major depressive disorder, single episode, unspecified: Secondary | ICD-10-CM | POA: Diagnosis present

## 2015-03-11 DIAGNOSIS — F1010 Alcohol abuse, uncomplicated: Secondary | ICD-10-CM | POA: Diagnosis present

## 2015-03-11 DIAGNOSIS — R4182 Altered mental status, unspecified: Secondary | ICD-10-CM | POA: Diagnosis present

## 2015-03-11 DIAGNOSIS — E785 Hyperlipidemia, unspecified: Secondary | ICD-10-CM | POA: Diagnosis present

## 2015-03-11 DIAGNOSIS — I10 Essential (primary) hypertension: Secondary | ICD-10-CM | POA: Diagnosis present

## 2015-03-11 DIAGNOSIS — E1129 Type 2 diabetes mellitus with other diabetic kidney complication: Secondary | ICD-10-CM | POA: Diagnosis present

## 2015-03-11 LAB — CBC WITH DIFFERENTIAL/PLATELET
Basophils Absolute: 0 10*3/uL (ref 0–0.1)
Basophils Relative: 1 %
EOS ABS: 0.1 10*3/uL (ref 0–0.7)
EOS PCT: 2 %
HCT: 32.9 % — ABNORMAL LOW (ref 40.0–52.0)
HEMOGLOBIN: 11.4 g/dL — AB (ref 13.0–18.0)
LYMPHS ABS: 1.1 10*3/uL (ref 1.0–3.6)
LYMPHS PCT: 27 %
MCH: 30 pg (ref 26.0–34.0)
MCHC: 34.8 g/dL (ref 32.0–36.0)
MCV: 86.1 fL (ref 80.0–100.0)
Monocytes Absolute: 0.5 10*3/uL (ref 0.2–1.0)
Monocytes Relative: 12 %
Neutro Abs: 2.4 10*3/uL (ref 1.4–6.5)
Neutrophils Relative %: 58 %
PLATELETS: 184 10*3/uL (ref 150–440)
RBC: 3.82 MIL/uL — ABNORMAL LOW (ref 4.40–5.90)
RDW: 12.6 % (ref 11.5–14.5)
WBC: 4.1 10*3/uL (ref 3.8–10.6)

## 2015-03-11 LAB — COMPREHENSIVE METABOLIC PANEL
ALBUMIN: 4.1 g/dL (ref 3.5–5.0)
ALK PHOS: 51 U/L (ref 38–126)
ALT: 42 U/L (ref 17–63)
AST: 63 U/L — ABNORMAL HIGH (ref 15–41)
Anion gap: 13 (ref 5–15)
BUN: 41 mg/dL — ABNORMAL HIGH (ref 6–20)
CALCIUM: 8.9 mg/dL (ref 8.9–10.3)
CO2: 23 mmol/L (ref 22–32)
CREATININE: 2.33 mg/dL — AB (ref 0.61–1.24)
Chloride: 77 mmol/L — ABNORMAL LOW (ref 101–111)
GFR calc non Af Amer: 29 mL/min — ABNORMAL LOW (ref >60)
GFR, EST AFRICAN AMERICAN: 33 mL/min — AB (ref >60)
Glucose, Bld: 176 mg/dL — ABNORMAL HIGH (ref 65–99)
Potassium: 3.3 mmol/L — ABNORMAL LOW (ref 3.5–5.1)
Sodium: 113 mmol/L — CL (ref 135–145)
Total Bilirubin: 0.4 mg/dL (ref 0.3–1.2)
Total Protein: 7.2 g/dL (ref 6.5–8.1)

## 2015-03-11 LAB — BASIC METABOLIC PANEL
Anion gap: 11 (ref 5–15)
BUN: 41 mg/dL — ABNORMAL HIGH (ref 6–20)
CALCIUM: 9 mg/dL (ref 8.9–10.3)
CHLORIDE: 78 mmol/L — AB (ref 101–111)
CO2: 26 mmol/L (ref 22–32)
CREATININE: 2.2 mg/dL — AB (ref 0.61–1.24)
GFR calc non Af Amer: 31 mL/min — ABNORMAL LOW (ref >60)
GFR, EST AFRICAN AMERICAN: 36 mL/min — AB (ref >60)
GLUCOSE: 167 mg/dL — AB (ref 65–99)
POTASSIUM: 3.4 mmol/L — AB (ref 3.5–5.1)
Sodium: 115 mmol/L — CL (ref 135–145)

## 2015-03-11 LAB — CK TOTAL AND CKMB (NOT AT ARMC)
CK, MB: 25.1 ng/mL — AB (ref 0.5–5.0)
Relative Index: 1.8 (ref 0.0–2.5)
Total CK: 1433 U/L — ABNORMAL HIGH (ref 49–397)

## 2015-03-11 LAB — URINALYSIS COMPLETE WITH MICROSCOPIC (ARMC ONLY)
BILIRUBIN URINE: NEGATIVE
Bacteria, UA: NONE SEEN
Glucose, UA: 150 mg/dL — AB
KETONES UR: NEGATIVE mg/dL
Leukocytes, UA: NEGATIVE
NITRITE: NEGATIVE
PH: 6 (ref 5.0–8.0)
PROTEIN: 30 mg/dL — AB
SPECIFIC GRAVITY, URINE: 1.006 (ref 1.005–1.030)
Squamous Epithelial / LPF: NONE SEEN

## 2015-03-11 LAB — URINE DRUG SCREEN, QUALITATIVE (ARMC ONLY)
Amphetamines, Ur Screen: NOT DETECTED
Barbiturates, Ur Screen: NOT DETECTED
Benzodiazepine, Ur Scrn: NOT DETECTED
Cannabinoid 50 Ng, Ur ~~LOC~~: NOT DETECTED
Cocaine Metabolite,Ur ~~LOC~~: POSITIVE — AB
MDMA (ECSTASY) UR SCREEN: NOT DETECTED
Methadone Scn, Ur: NOT DETECTED
Opiate, Ur Screen: NOT DETECTED
PHENCYCLIDINE (PCP) UR S: NOT DETECTED
TRICYCLIC, UR SCREEN: NOT DETECTED

## 2015-03-11 LAB — GLUCOSE, CAPILLARY: GLUCOSE-CAPILLARY: 184 mg/dL — AB (ref 65–99)

## 2015-03-11 LAB — TROPONIN I: Troponin I: <0.03 ng/mL (ref <0.031)

## 2015-03-11 LAB — ETHANOL: Alcohol, Ethyl (B): 30 mg/dL — ABNORMAL HIGH (ref <5)

## 2015-03-11 MED ORDER — ACETAMINOPHEN 325 MG PO TABS: 650.0000 mg | ORAL_TABLET | Freq: Four times a day (QID) | ORAL | Status: DC | PRN

## 2015-03-11 MED ORDER — SODIUM CHLORIDE 0.9 % IV SOLN
Freq: Once | INTRAVENOUS | Status: AC
  Administered 2015-03-11: 17:00:00 via INTRAVENOUS

## 2015-03-11 MED ORDER — HEPARIN SODIUM (PORCINE) 5000 UNIT/ML IJ SOLN
5000.0000 [IU] | Freq: Three times a day (TID) | INTRAMUSCULAR | Status: DC
  Administered 2015-03-11 – 2015-03-15 (×10): 5000 [IU] via SUBCUTANEOUS
  Filled 2015-03-11 (×10): qty 1

## 2015-03-11 MED ORDER — INSULIN GLARGINE 100 UNIT/ML ~~LOC~~ SOLN
10.0000 [IU] | Freq: Every day | SUBCUTANEOUS | Status: DC
  Administered 2015-03-11 – 2015-03-14 (×4): 10 [IU] via SUBCUTANEOUS
  Filled 2015-03-11 (×5): qty 0.1

## 2015-03-11 MED ORDER — SODIUM CHLORIDE 0.9 % IV SOLN
INTRAVENOUS | Status: DC
  Administered 2015-03-11 – 2015-03-15 (×4): via INTRAVENOUS

## 2015-03-11 MED ORDER — CARVEDILOL 6.25 MG PO TABS
6.2500 mg | ORAL_TABLET | Freq: Two times a day (BID) | ORAL | Status: DC
  Administered 2015-03-12 – 2015-03-15 (×7): 6.25 mg via ORAL
  Filled 2015-03-11 (×7): qty 1

## 2015-03-11 MED ORDER — POLYETHYLENE GLYCOL 3350 17 G PO PACK: 17.0000 g | PACK | Freq: Every day | ORAL | Status: DC | PRN

## 2015-03-11 MED ORDER — ONDANSETRON HCL 4 MG/2ML IJ SOLN: 4.0000 mg | Freq: Four times a day (QID) | INTRAMUSCULAR | Status: DC | PRN

## 2015-03-11 MED ORDER — ACETAMINOPHEN 650 MG RE SUPP: 650.0000 mg | Freq: Four times a day (QID) | RECTAL | Status: DC | PRN

## 2015-03-11 MED ORDER — INSULIN ASPART 100 UNIT/ML ~~LOC~~ SOLN
0.0000 [IU] | Freq: Every day | SUBCUTANEOUS | Status: DC
  Administered 2015-03-13: 2 [IU] via SUBCUTANEOUS
  Filled 2015-03-11: qty 2
  Filled 2015-03-11: qty 3
  Filled 2015-03-11: qty 2
  Filled 2015-03-11: qty 1
  Filled 2015-03-11: qty 2
  Filled 2015-03-11: qty 1

## 2015-03-11 MED ORDER — INSULIN ASPART 100 UNIT/ML ~~LOC~~ SOLN
0.0000 [IU] | Freq: Three times a day (TID) | SUBCUTANEOUS | Status: DC
  Administered 2015-03-12 – 2015-03-13 (×4): 2 [IU] via SUBCUTANEOUS
  Administered 2015-03-13: 1 [IU] via SUBCUTANEOUS
  Administered 2015-03-14: 2 [IU] via SUBCUTANEOUS
  Administered 2015-03-14: 1 [IU] via SUBCUTANEOUS
  Administered 2015-03-14: 3 [IU] via SUBCUTANEOUS
  Administered 2015-03-15 (×2): 2 [IU] via SUBCUTANEOUS
  Filled 2015-03-11 (×4): qty 2
  Filled 2015-03-11: qty 3

## 2015-03-11 MED ORDER — ONDANSETRON HCL 4 MG PO TABS: 4.0000 mg | ORAL_TABLET | Freq: Four times a day (QID) | ORAL | Status: DC | PRN

## 2015-03-11 NOTE — ED Notes (Signed)
PT arrived via EMS from home after drinking with some friends this afternoon, felt like his sugar was low and went inside.  Per EMS patient was found by friends on the floor unresponsive. When patient arrived patient was incontinent of bowels and does not remember what happened.

## 2015-03-11 NOTE — ED Notes (Signed)
Returned from CT.

## 2015-03-11 NOTE — ED Notes (Signed)
Unknown if patient had a seizure at home.  Was found at home by friends unresponsive.  No seizure activity since EMS arrived and patient brought to hospital. Patient does not remember anything before going unresponsive and states he has never had a seizure before.

## 2015-03-11 NOTE — ED Notes (Signed)
Provider aware of low sodium level.

## 2015-03-11 NOTE — H&P (Signed)
[facility] [place] [facility]   PATIENT NAME: Maurice Jordan    MR#:  [id number]  DATE OF BIRTH:  16-Jan-1956  DATE OF ADMISSION:  03/11/2015  PRIMARY CARE PHYSICIAN: No primary care provider on file.   REQUESTING/REFERRING PHYSICIAN: Dr Pershing Proud  CHIEF COMPLAINT:   Chief Complaint  Patient presents with  . Altered Mental Status    HISTORY OF PRESENT ILLNESS:  Maurice Jordan  is a 59 y.o. male with a known history of alcohol abuse, chronic systolic heart failure with ejection fraction of 35%, chronic hyponatremia, hypertension, diabetes mellitus who presents via EMS today after being found on the floor of his home by his friends. The patient is unable to provide any history he does not know how or why he is in the emergency department. Orting to the EMS report he was drinking alcohol with friends and felt that his blood sugar was dropping, he went into the house and then was found unconscious on the floor. Of note he had become incontinent of bowel. On presentation to the emergency department he continues to be confused, his serum sodium is significantly lower than prior measurements at 113. UDS positive for cocaine serum ethanol level is positive.  PAST MEDICAL HISTORY:   Past Medical History  Diagnosis Date  . Diabetes   . Congestive heart failure   . Hypertension   . Neuropathy   . Hyperlipidemia   . Depression   . Osteomyelitis of toe     3rd toe  . Hx MRSA infection 04/15/2014  . Alcohol abuse     PAST SURGICAL HISTORY:  History reviewed. No pertinent past surgical history.  SOCIAL HISTORY:   History  Substance Use Topics  . Smoking status: Never Smoker   . Smokeless tobacco: Not on file  . Alcohol Use: Yes     Comment: unknown  amount    FAMILY HISTORY:  History reviewed. No pertinent family history. patient unable to give history due to altered mental status. Per the chart family history is positive for diabetes in multiple  family members and his father died in a car accident  DRUG ALLERGIES:  No Known Allergies  REVIEW OF SYSTEMS:   ROS patient unable to provide review of systems due to altered mental status  MEDICATIONS AT HOME:   Prior to Admission medications   Not on File    Patient unable to list his home medications due to altered mental status. He was discharged one month ago from this hospital and at that time he was prescribed gabapentin 300 mg 3 tablets at bedtime, Lantus 10 units once a day at bedtime, aspirin 81 mg daily, citalopram 20 mg daily, tamsulosin 0.4 mg daily, atorvastatin 40 mg 2 tablets once a day, chlorthalidone 25 mg 1 tablet daily, Tylenol 325 as needed for pain, lisinopril 20 mg daily, Coreg 6.25 mg twice a day, thiamine 100 mg daily, Colace, furosemide 20 mg 1 tablet twice a day, potassium chloride 10 mEq once a day  VITAL SIGNS:  Blood pressure 151/105, pulse 81, temperature 98.2 F (36.8 C), temperature source Oral, resp. rate 19, height  (1.676 m), weight 77.021 kg (169 lb 12.8 oz), SpO2 100 %.  PHYSICAL EXAMINATION:  GENERAL:  59 y.o.-year-old patient lying in the bed with no acute distress.  EYES: Pupils equal, round, reactive to light and accommodation. No scleral icterus. Extraocular muscles intact.  HEENT: Head atraumatic, normocephalic. Oropharynx and nasopharynx clear. Edentulous, oral mucous membranes pink and moist NECK:  Supple, no jugular venous distention. No thyroid enlargement, no tenderness.  LUNGS: Normal breath sounds bilaterally, no wheezing, rales,rhonchi or crepitation. No use of accessory muscles of respiration.  CARDIOVASCULAR: S1, S2 normal. No murmurs, rubs, or gallops.  ABDOMEN: Soft, nontender, slightly distended. Bowel sounds present. No organomegaly or mass. No rebound or guarding EXTREMITIES: No pedal edema, cyanosis, or clubbing.  NEUROLOGIC: Cranial nerves II through XII are intact. Muscle strength 5/5 in all extremities. Sensation intact.  Gait not checked. He is confused. PSYCHIATRIC: The patient is alert and oriented to person only SKIN: No obvious rash, lesion, or ulcer.   LABORATORY PANEL:   CBC  Recent Labs Lab 03/11/15 1642  WBC 4.1  HGB 11.4*  HCT 32.9*  PLT 184   ------------------------------------------------------------------------------------------------------------------  Chemistries   Recent Labs Lab 03/11/15 1642  NA 113*  K 3.3*  CL 77*  CO2 23  GLUCOSE 176*  BUN 41*  CREATININE 2.33*  CALCIUM 8.9  AST 63*  ALT 42  ALKPHOS 51  BILITOT 0.4   ------------------------------------------------------------------------------------------------------------------  Cardiac Enzymes  Recent Labs Lab 03/11/15 1642  TROPONINI <0.03   ------------------------------------------------------------------------------------------------------------------  RADIOLOGY:  Ct Head Wo Contrast  03/11/2015   CLINICAL DATA:  Found unresponsive.  EXAM: CT HEAD WITHOUT CONTRAST  TECHNIQUE: Contiguous axial images were obtained from the base of the skull through the vertex without intravenous contrast.  COMPARISON:  08/07/2014  FINDINGS: There is mild cerebral atrophy. No acute intracranial abnormality. Specifically, no hemorrhage, hydrocephalus, mass lesion, acute infarction, or significant intracranial injury. No acute calvarial abnormality. Visualized paranasal sinuses and mastoids clear. Orbital soft tissues unremarkable.  IMPRESSION: No acute intracranial abnormality.   Electronically Signed   By: Charlett Nose M.D.   On: 03/11/2015 17:27    EKG:  No orders found for this or any previous visit.  IMPRESSION AND PLAN:   Principal Problem:   Altered mental status Active Problems:   Hyponatremia   Chronic systolic congestive heart failure   Polysubstance abuse   Alcohol abuse   Hypertension   Diabetes mellitus   Rhabdomyolysis   Acute renal failure superimposed on stage 3 chronic kidney  disease  Problem #1 altered mental status: Multifactorial likely due to polysubstance abuse, alcohol intoxication and hyponatremia. CT of the head shows mild cerebral atrophy no acute abnormalities. He has no signs of infection. We'll treat the aforementioned and monitor carefully. I am not sure what his mental status baseline is.  Problem #2 acute on chronic hyponatremia: This patient has been admitted multiple times and has had serum sodiums ranging between 125 and 1:30. Current hyponatremia likely due to congestive heart failure, beer potomania with consumption of 3 x 40 ounce beers daily, likely medication and dietary noncompliance. He has received 1 L of normal saline in the emergency department. We'll continue with normal saline at a slower rate of 75 cc per hour, will fluid restrict. Some of his medications including chlorthalidone and citalopram can also contribute to hyponatremia and should be discontinued in this patient  Problem #3 chronic systolic congestive heart failure: Ejection fraction 35%. No acute exacerbation at this time. Monitor carefully as we are giving IV fluids. Will continue Coreg. At this point we'll hold lisinopril and furosemide due to renal failure.  Problem #4 hypertension all and he is hypertensive at this time.  Discontinue chlorthalidone as this is a known contributor to hyponatremia. Hold first MI at an lisinopril in the setting of renal failure. Restart Coreg provide when necessary hydralazine  Problem #5 alcohol  and polysubstance abuse: He is currently intoxicated with an elevated ethanol level. Will monitor for signs of withdrawal with CIWA protocol. Have consulted case management for assistance. He will need referral to a detoxification program  #6 rhabdomyolysis: He has a mildly elevated CK level at 1433. This is likely due to the time he spent on the floor unconscious. He does not have any signs of injury at this time. Continue with gentle fluid hydration and  monitor. Monitor renal function at this time creatinine is significantly elevated above baseline  #7 acute renal failure on chronic kidney disease stage III: His baseline creatinine is about 1.5 today 2.33. This may be due to rhabdomyolysis, or possibly due to cardiorenal syndrome. Currently providing gentle IV hydration. Will also consult nephrology for assistance with renal failure and hyponatremia in this patient.  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 Management plans discussed with the patient, family and they are in agreement.  CODE STATUS: Full  TOTAL TIME TAKING CARE OF THIS PATIENT: 55 minutes.    Elby Showers M.D on 03/11/2015 at 7:10 PM  Between 7am to 6pm - Pager - [phone]  After 6pm go to [email or web address] - password [email or web address]  [facility]  Office  [phone]  CC: Primary care physician; No primary care provider on file.

## 2015-03-11 NOTE — ED Notes (Signed)
Cari FNP notified of low sodium.

## 2015-03-11 NOTE — ED Provider Notes (Signed)
[facility] Emergency Department Provider Note  ____________________________________________  Time seen: Approximately 4:53 PM  I have reviewed the triage vital signs and the nursing notes.   HISTORY  Chief Complaint Altered Mental Status    HPI Maurice Jordan is a 59 y.o. male who presents to the emergency department after a couple episode at home. Per EMS patient was drinking with his friends and felt as if his blood sugar was dropping so he went inside to get something sweet. His friends then found him in the floor and called EMS. Patient had become incontinent of bowel. Patient is alert at this time. He is unable to recall what happened after he went inside. He denies drinking alcohol today.   Past Medical History  Diagnosis Date  . Diabetes   . Congestive heart failure   . Hypertension   . Neuropathy   . Hyperlipidemia   . Depression   . Osteomyelitis of toe     3rd toe  . Hx MRSA infection 04/15/2014  . Alcohol abuse     There are no active problems to display for this patient.   History reviewed. No pertinent past surgical history.  No current outpatient prescriptions on file.  Allergies Review of patient's allergies indicates no known allergies.  History reviewed. No pertinent family history.  Social History History  Substance Use Topics  . Smoking status: Never Smoker   . Smokeless tobacco: Not on file  . Alcohol Use: Yes     Comment: unknown  amount    Review of Systems Constitutional: No fever/chills Eyes: No visual changes. ENT: No sore throat. Cardiovascular: Denies chest pain. Respiratory: Denies shortness of breath. Gastrointestinal: No abdominal pain.  No nausea, no vomiting.  No diarrhea.  No constipation. Genitourinary: Negative for dysuria. Musculoskeletal: Negative for back pain. Skin: Negative for rash. Neurological: Negative for headaches, focal weakness or numbness.  10-point ROS otherwise  negative.  ____________________________________________   PHYSICAL EXAM:  VITAL SIGNS: ED Triage Vitals  Enc Vitals Group     BP 03/11/15 1630 143/96 mmHg     Pulse Rate 03/11/15 1630 79     Resp 03/11/15 1630 16     Temp 03/11/15 1630 98.2 F (36.8 C)     Temp Source 03/11/15 1630 Oral     SpO2 03/11/15 1623 98 %     Weight 03/11/15 1630 169 lb 12.8 oz (77.021 kg)     Height 03/11/15 1630  (1.676 m)     Head Cir --      Peak Flow --      Pain Score --      Pain Loc --      Pain Edu? --      Excl. in GC? --     Constitutional: Alert and oriented to person, place, and time. Chronically ill appearing and in no acute distress. Eyes: Conjunctivae are normal. PERRL. EOMI. Head: Atraumatic. Nose: No congestion/rhinnorhea. Mouth/Throat: Mucous membranes are moist.  Oropharynx non-erythematous. Neck: No stridor.  Hematological/Lymphatic/Immunilogical: No cervical lymphadenopathy. Cardiovascular: Normal rate, regular rhythm. Grossly normal heart sounds. Respiratory: Normal respiratory effort.  No retractions. Lungs CTAB. Gastrointestinal: Soft and nontender. No distention. No abdominal bruits. No CVA tenderness.  Musculoskeletal: No lower extremity tenderness nor edema.  No joint effusions. Neurologic:  Normal speech and language. No gross focal neurologic deficits are appreciated. Speech is normal. Modified NIH scale 0. Skin:  Skin is warm, dry and intact. No rash noted. Psychiatric: Mood and affect are normal. Speech  and behavior are normal.  ____________________________________________   LABS (all labs ordered are listed, but only abnormal results are displayed)  Labs Reviewed  CBC WITH DIFFERENTIAL/PLATELET - Abnormal; Notable for the following:    RBC 3.82 (*)    Hemoglobin 11.4 (*)    HCT 32.9 (*)    All other components within normal limits  COMPREHENSIVE METABOLIC PANEL - Abnormal; Notable for the following:    Sodium 113 (*)    Potassium 3.3 (*)     Chloride 77 (*)    Glucose, Bld 176 (*)    BUN 41 (*)    Creatinine, Ser 2.33 (*)    AST 63 (*)    GFR calc non Af Amer 29 (*)    GFR calc Af Amer 33 (*)    All other components within normal limits  CK TOTAL AND CKMB - Abnormal; Notable for the following:    Total CK 1433 (*)    CK, MB 25.1 (*)    All other components within normal limits  ETHANOL - Abnormal; Notable for the following:    Alcohol, Ethyl (B) 30 (*)    All other components within normal limits  URINALYSIS COMPLETEWITH MICROSCOPIC ([facility])  - Abnormal; Notable for the following:    Color, Urine STRAW (*)    APPearance CLEAR (*)    Glucose, UA 150 (*)    Hgb urine dipstick 2+ (*)    Protein, ur 30 (*)    All other components within normal limits  URINE DRUG SCREEN, QUALITATIVE ([facility]) - Abnormal; Notable for the following:    Cocaine Metabolite,Ur [place] POSITIVE (*)    All other components within normal limits  TROPONIN I   ____________________________________________  EKG  Sinus rhythm, rate is 78, no pacer spikes appreciated on my review, no ST elevation or depression. ____________________________________________  RADIOLOGY  Head CT negative for acute process. ____________________________________________   PROCEDURES  Procedure(s) performed: None  Critical Care performed: No  ____________________________________________   INITIAL IMPRESSION / ASSESSMENT AND PLAN / ED COURSE  Pertinent labs & imaging results that were available during my care of the patient were reviewed by me and considered in my medical decision making (see chart for details).  Initial impression: Altered mental status possibly related to substance abuse. Will do a CT scan of the head, labs, and EKG.  ----------------------------------------- 6:26 PM on 03/11/2015 -----------------------------------------  Patient now alert and oriented. He is asking how he got here and what happened. He is alert and oriented to person place  and time.  Events of the day reviewed with patient. He was advised of plan for admission and agrees to stay. ____________________________________________   FINAL CLINICAL IMPRESSION(S) / ED DIAGNOSES  Final diagnoses:  Non-traumatic rhabdomyolysis  Acute renal failure, unspecified acute renal failure type  Cocaine abuse  Alcohol abuse  Hyponatremia      Chinita Pester, FNP 03/11/15 1829  Myrna Blazer, MD 03/12/15 [phone]

## 2015-03-11 NOTE — ED Notes (Signed)
Lab notified to run HgA1c with blood work sent with admission labs.

## 2015-03-11 NOTE — ED Notes (Signed)
Patient transported to CT 

## 2015-03-11 NOTE — ED Provider Notes (Addendum)
ED ECG REPORT I, Arelia Longest, the attending physician, personally viewed and interpreted this ECG.   Date: 03/11/2015  EKG Time: 1625  Rate: 78  Rhythm: normal sinus rhythm, did not see pacer spikes despite read of demand pacer from machine  Axis: Normal  Intervals:none  ST&T Change: T wave inversions in 1, aVL, V2 through 5. No ST elevations or depressions. No STEMI. No previous for comparison.   Myrna Blazer, MD 03/11/15 [phone]  Patient to the bedside. Patient slow to respond but fully oriented. Given metabolic derangements were noted to the hospital.  Myrna Blazer, MD 03/11/15 [phone]

## 2015-03-12 LAB — BASIC METABOLIC PANEL
Anion gap: 9 (ref 5–15)
Anion gap: 8 (ref 5–15)
BUN: 36 mg/dL — ABNORMAL HIGH (ref 6–20)
BUN: 32 mg/dL — AB (ref 6–20)
CALCIUM: 8.8 mg/dL — AB (ref 8.9–10.3)
CHLORIDE: 84 mmol/L — AB (ref 101–111)
CO2: 26 mmol/L (ref 22–32)
CO2: 28 mmol/L (ref 22–32)
Calcium: 8.6 mg/dL — ABNORMAL LOW (ref 8.9–10.3)
Chloride: 83 mmol/L — ABNORMAL LOW (ref 101–111)
Creatinine, Ser: 1.93 mg/dL — ABNORMAL HIGH (ref 0.61–1.24)
Creatinine, Ser: 1.85 mg/dL — ABNORMAL HIGH (ref 0.61–1.24)
GFR calc Af Amer: 42 mL/min — ABNORMAL LOW (ref >60)
GFR calc Af Amer: 44 mL/min — ABNORMAL LOW (ref >60)
GFR calc non Af Amer: 36 mL/min — ABNORMAL LOW (ref >60)
GFR calc non Af Amer: 38 mL/min — ABNORMAL LOW (ref >60)
Glucose, Bld: 132 mg/dL — ABNORMAL HIGH (ref 65–99)
Glucose, Bld: 202 mg/dL — ABNORMAL HIGH (ref 65–99)
POTASSIUM: 3.1 mmol/L — AB (ref 3.5–5.1)
POTASSIUM: 3.4 mmol/L — AB (ref 3.5–5.1)
SODIUM: 118 mmol/L — AB (ref 135–145)
SODIUM: 120 mmol/L — AB (ref 135–145)

## 2015-03-12 LAB — CBC
HEMATOCRIT: 30.9 % — AB (ref 40.0–52.0)
Hemoglobin: 10.9 g/dL — ABNORMAL LOW (ref 13.0–18.0)
MCH: 30 pg (ref 26.0–34.0)
MCHC: 35.4 g/dL (ref 32.0–36.0)
MCV: 84.8 fL (ref 80.0–100.0)
PLATELETS: 194 10*3/uL (ref 150–440)
RBC: 3.64 MIL/uL — AB (ref 4.40–5.90)
RDW: 12.6 % (ref 11.5–14.5)
WBC: 5.2 10*3/uL (ref 3.8–10.6)

## 2015-03-12 LAB — GLUCOSE, CAPILLARY
GLUCOSE-CAPILLARY: 151 mg/dL — AB (ref 65–99)
Glucose-Capillary: 195 mg/dL — ABNORMAL HIGH (ref 65–99)
Glucose-Capillary: 194 mg/dL — ABNORMAL HIGH (ref 65–99)
Glucose-Capillary: 207 mg/dL — ABNORMAL HIGH (ref 65–99)

## 2015-03-12 LAB — HEMOGLOBIN A1C: Hgb A1c MFr Bld: 7.9 % — ABNORMAL HIGH (ref 4.0–6.0)

## 2015-03-12 MED ORDER — ADULT MULTIVITAMIN W/MINERALS CH
1.0000 | ORAL_TABLET | Freq: Every day | ORAL | Status: DC
Start: 2015-03-12 — End: 2015-03-15
  Administered 2015-03-12 – 2015-03-15 (×4): 1 via ORAL
  Filled 2015-03-12 (×4): qty 1

## 2015-03-12 MED ORDER — THIAMINE HCL 100 MG/ML IJ SOLN
100.0000 mg | Freq: Every day | INTRAMUSCULAR | Status: DC
  Filled 2015-03-12: qty 2
  Filled 2015-03-12: qty 1

## 2015-03-12 MED ORDER — THIAMINE HCL 100 MG/ML IJ SOLN
Freq: Once | INTRAVENOUS | Status: AC
  Administered 2015-03-12: 15:00:00 via INTRAVENOUS

## 2015-03-12 MED ORDER — LORAZEPAM 2 MG/ML IJ SOLN: 0.0000 mg | Freq: Two times a day (BID) | INTRAMUSCULAR | Status: DC

## 2015-03-12 MED ORDER — THIAMINE HCL 100 MG/ML IJ SOLN
Freq: Once | INTRAVENOUS | Status: AC
  Administered 2015-03-12: 09:00:00 via INTRAVENOUS
  Filled 2015-03-12: qty 1000

## 2015-03-12 MED ORDER — VITAMIN B-1 100 MG PO TABS
100.0000 mg | ORAL_TABLET | Freq: Every day | ORAL | Status: DC
  Administered 2015-03-12 – 2015-03-15 (×4): 100 mg via ORAL
  Filled 2015-03-12 (×4): qty 1

## 2015-03-12 MED ORDER — LORAZEPAM 2 MG/ML IJ SOLN
0.0000 mg | Freq: Four times a day (QID) | INTRAMUSCULAR | Status: DC
  Administered 2015-03-12 (×3): 2 mg via INTRAVENOUS
  Filled 2015-03-12 (×3): qty 1

## 2015-03-12 MED ORDER — FUROSEMIDE 10 MG/ML IJ SOLN
40.0000 mg | Freq: Once | INTRAMUSCULAR | Status: AC
  Administered 2015-03-12: 40 mg via INTRAVENOUS
  Filled 2015-03-12: qty 4

## 2015-03-12 MED ORDER — FUROSEMIDE 10 MG/ML IJ SOLN
INTRAMUSCULAR | Status: AC
  Filled 2015-03-12: qty 4

## 2015-03-12 MED ORDER — FOLIC ACID 1 MG PO TABS
1.0000 mg | ORAL_TABLET | Freq: Every day | ORAL | Status: DC
  Administered 2015-03-12 – 2015-03-15 (×4): 1 mg via ORAL
  Filled 2015-03-12 (×4): qty 1

## 2015-03-12 MED ORDER — POTASSIUM CHLORIDE CRYS ER 20 MEQ PO TBCR
40.0000 meq | EXTENDED_RELEASE_TABLET | ORAL | Status: AC
  Administered 2015-03-12 (×3): 40 meq via ORAL
  Filled 2015-03-12 (×3): qty 2

## 2015-03-12 MED ORDER — LORAZEPAM 2 MG/ML IJ SOLN: 1.0000 mg | Freq: Four times a day (QID) | INTRAMUSCULAR | Status: DC | PRN

## 2015-03-12 MED ORDER — LORAZEPAM 1 MG PO TABS: 1.0000 mg | ORAL_TABLET | Freq: Four times a day (QID) | ORAL | Status: DC | PRN

## 2015-03-12 NOTE — Progress Notes (Addendum)
Paged Dr. Wynelle Link to advise that BMP results available.  He advised to continue plan of care since lab values are improving.

## 2015-03-12 NOTE — Progress Notes (Signed)
[facility]  ROUNDING NOTE   Subjective:   [name] presents to [facility] with altered mental status and found on the floor at home by friends. Patient is unable to give much of a history. He was incontinent of bowel and bladder.  CPK level elevated.  On Admission, sodium of 113. Started on IV normal saline and sodium has gone to 115 and now 118.  Nephrology consulted for hyponatremia and acute renal failure on chronic kidney disease stage III.   Objective:  Vital signs in last 24 hours:  Temp:  [98.2 F (36.8 C)-99.1 F (37.3 C)] 98.4 F (36.9 C) (05/22 1121) Pulse Rate:  [78-87] 79 (05/22 1121) Resp:  [14-19] 18 (05/22 1121) BP: (117-168)/(73-105) 117/74 mmHg (05/22 1121) SpO2:  [94 %-100 %] 98 % (05/22 1121) Weight:  [76.2 kg (167 lb 15.9 oz)-77.1 kg (169 lb 15.6 oz)] 76.2 kg (167 lb 15.9 oz) (05/22 0605)  Weight change:  Filed Weights   03/11/15 1630 03/11/15 2156 03/12/15 0605  Weight: 77.021 kg (169 lb 12.8 oz) 77.1 kg (169 lb 15.6 oz) 76.2 kg (167 lb 15.9 oz)    Intake/Output: I/O last 3 completed shifts: In: -  Out: 1825 [Urine:1825]   Intake/Output this shift:  Total I/O In: 1047.5 [I.V.:1047.5] Out: 600 [Urine:600]  Physical Exam: General: NAD  Head: Normocephalic, atraumatic. Moist oral mucosal membranes  Eyes: Anicteric, PERRL  Neck: Supple, trachea midline  Lungs:  Clear to auscultation  Heart: Regular rate and rhythm  Abdomen:  Soft, nontender,   Extremities: no peripheral edema.  Neurologic: Nonfocal, moving all four extremities  Skin: No lesions       Basic Metabolic Panel:  Recent Labs Lab 03/11/15 1642 03/11/15 1928 03/12/15 0418  NA 113* 115* 118*  K 3.3* 3.4* 3.1*  CL 77* 78* 83*  CO2 23 26 26   GLUCOSE 176* 167* 132*  BUN 41* 41* 36*  CREATININE 2.33* 2.20* 1.93*  CALCIUM 8.9 9.0 8.6*    Liver Function Tests:  Recent Labs Lab 03/11/15 1642  AST 63*  ALT 42  ALKPHOS 51  BILITOT 0.4  PROT 7.2  ALBUMIN 4.1    No results for input(s): LIPASE, AMYLASE in the last 168 hours. No results for input(s): AMMONIA in the last 168 hours.  CBC:  Recent Labs Lab 03/11/15 1642 03/12/15 0418  WBC 4.1 5.2  NEUTROABS 2.4  --   HGB 11.4* 10.9*  HCT 32.9* 30.9*  MCV 86.1 84.8  PLT 184 194    Cardiac Enzymes:  Recent Labs Lab 03/11/15 1642  CKTOTAL 1433*  CKMB 25.1*  TROPONINI <0.03    BNP: Invalid input(s): POCBNP  CBG:  Recent Labs Lab 03/11/15 2212 03/12/15 0815 03/12/15 1122  GLUCAP 184* 151* 195*    Microbiology: Results for orders placed or performed in visit on 04/11/14  Wound culture     Status: None   Collection Time: 04/14/14  2:08 PM  Result Value Ref Range Status   Micro Text Report   Final       SOURCE: LEFT 3RD & 4TH TOE    ORGANISM 1                MODERATE GROWTH METHICILLIN RESISTANT STAPH.AUREUS   ORGANISM 2                LIGHT GROWTH ENTEROCOCCUS FAECALIS   ORGANISM 3                MODERATE GROWTH KLEBSIELLA SPECIES  COMMENT                   -   COMMENT                   WITH NORMAL SKIN FLORA   COMMENT                   NO ANAEROBES ISOLATED IN 4 DAYS   GRAM STAIN                MODERATE WHITE BLOOD CELLS   GRAM STAIN                MANY GRAM POSITIVE COCCI IN PAIRS IN CLUSTERS   GRAM STAIN                FEW GRAM POSITIVE ROD RARE GRAM NEGATIVE ROD   ANTIBIOTIC                    ORG#1    ORG#2    ORG#3    ORG#4     CIPROFLOXACIN                 S                 S        S         CLINDAMYCIN                   S                                    ERYTHROMYCIN                  R                                    GENTAMICIN                    S                 S        S         LEVOFLOXACIN                  S                 S        S          LINEZOLID                     S        S                           OXACILLIN                     R                                    TIGECYCLINE                   S  VANCOMYCIN                    S                                    CEFOXITIN SCREEN              POSITIVE                             INDUCIBLE CLINDAMYCIN RESISTANNEGATIVE                             TRIMETHOPRIM/SULFAMETHOXAZOLE S                 S        S         AMPICILLIN                             S        R        R         CEFAZOLIN                                       S        S         CEFOXITIN                                       S        S         CEFTAZIDIME                                     S        S         CEFTRIAXONE                                     S        S         IMIPENEM                                        S        S           Wound culture     Status: None   Collection Time: 04/15/14 12:19 PM  Result Value Ref Range Status   Micro Text Report   Final       SOURCE: LEFT THIRD TOE    ORGANISM 1                HEAVY GROWTH METHICILLIN RESISTANT STAPH.AUREUS   ORGANISM 2                LIGHT GROWTH ENTEROBACTER CLOACAE SSP CLOACAE   ORGANISM 3  MODERATE GROWTH ENTEROCOCCUS FAECALIS   ORGANISM 4                HEAVY GROWTH FINEGOLDIA MAGNA   COMMENT                   -   COMMENT                   WITH NORMAL SKIN FLORA   GRAM STAIN                MODERATE WHITE BLOOD CELLS   GRAM STAIN                FEW GRAM POSITIVE COCCI IN PAIRS IN CLUSTERS   GRAM STAIN                MODERATE GRAM NEGATIVE ROD   ANTIBIOTIC                    ORG#1    ORG#2    ORG#3    ORG#4     CIPROFLOXACIN                 S        S                           CLINDAMYCIN                   S                                    ERYTHROMYCIN                  R                                    GENTAMICIN                    S        S                           LEVOFLOXACIN                  S        S                           LINEZOLID                      S                 S                  OXACILLIN                     R                                     TIGECYCLINE                   S  VANCOMYCIN                    S                                    CEFOXITIN SCREEN              POSITIVE                             INDUCIBLE CLINDAMYCIN RESISTANNEGATIVE                             TRIMETHOPRIM/SULFAMETHOXAZOLE S        S                           CEFAZOLIN                              R                           CEFOXITIN                              R                           CEFTAZIDIME                            S                           CEFTRIAXONE                            S                           IMIPENEM                               S                           AMPICILLIN                                      S                  BETA-LACTAMASE                                           NEGATIVE      Coagulation Studies: No results for input(s): LABPROT, INR in the last 72 hours.  Urinalysis:  Recent Labs  03/11/15 1642  COLORURINE STRAW*  LABSPEC 1.006  PHURINE 6.0  GLUCOSEU 150*  HGBUR 2+*  BILIRUBINUR NEGATIVE  KETONESUR NEGATIVE  PROTEINUR 30*  NITRITE NEGATIVE  LEUKOCYTESUR NEGATIVE      Imaging: Ct Head Wo Contrast  03/11/2015   CLINICAL DATA:  Found unresponsive.  EXAM: CT HEAD WITHOUT CONTRAST  TECHNIQUE: Contiguous axial images were obtained from the base of the skull through the vertex without intravenous contrast.  COMPARISON:  08/07/2014  FINDINGS: There is mild cerebral atrophy. No acute intracranial abnormality. Specifically, no hemorrhage, hydrocephalus, mass lesion, acute infarction, or significant intracranial injury. No acute calvarial abnormality. Visualized paranasal sinuses and mastoids clear. Orbital soft tissues unremarkable.  IMPRESSION: No acute intracranial abnormality.   Electronically Signed   By: Charlett Nose M.D.   On: 03/11/2015 17:27     Medications:   . sodium chloride 75 mL/hr at 03/12/15 0925   . carvedilol  6.25 mg Oral BID WC  . folic  acid  1 mg Oral Daily  . furosemide      . heparin  5,000 Units Subcutaneous 3 times per day  . insulin aspart  0-5 Units Subcutaneous QHS  . insulin aspart  0-9 Units Subcutaneous TID WC  . insulin glargine  10 Units Subcutaneous QHS  . LORazepam  0-4 mg Intravenous Q6H   Followed by  . [START ON 03/14/2015] LORazepam  0-4 mg Intravenous Q12H  . multivitamin with minerals  1 tablet Oral Daily  . potassium chloride  40 mEq Oral Q4H  . banana bag IV 1000 mL   Intravenous Once  . thiamine  100 mg Oral Daily   Or  . thiamine  100 mg Intravenous Daily   acetaminophen **OR** acetaminophen, LORazepam **OR** LORazepam, ondansetron **OR** ondansetron (ZOFRAN) IV, polyethylene glycol  Assessment/ Plan:  Mr. Jaelynn Currier is a 59 y.o. black  male with congestive heart failure, alcohol abuse, hyeprtension, diabetes mellitus type II, history of osteomyelitis MRSA, hyperlipidemia, depression, peripheral neuropathy who presents with acute renal failure on chronic kidney disease and severe hyponatremia.   1. Hyponatremia: severe. History suggestive of Beer Potomania. Improving with IV fluids. Although patient was also on chlorthalidone which may be playing a role.  Continue to monitor serum sodium level to prevent overcorrection.  Volume status seems to be improving.   2. Acute renal failure on chronic kidney disease stage III: seems to be secondary to prerenal azotemia. Creatinine improving. Nonoliguric urine output. Rhabdomyolysis also present.  CKD is secondary to alcohol abuse, diabetes and hypertension.   3. Hypertension: currently well controlled. Home medications currently not on file. Supposed to be on tamsulosin, chlorthalidone, lisinopril, furosemide and carvedilol  4. Diabetes mellitus type II with renal manifestations. Continue glucose control    LOS: 1 Daniyal Tabor 5/22/20162:00 PM

## 2015-03-12 NOTE — Progress Notes (Signed)
[facility] [place] at [facility]   PATIENT NAME: Maurice Jordan    MR#:  [id number]  DATE OF BIRTH:  11-27-1955  SUBJECTIVE:  CHIEF COMPLAINT:   Chief Complaint  Patient presents with  . Altered Mental Status   More oriented this morning. No acute complaints.  Still does not remember events of yesterday prior to admission.  REVIEW OF SYSTEMS:   Review of Systems  Constitutional: Negative for fever.  Respiratory: Negative for shortness of breath.   Cardiovascular: Negative for chest pain and palpitations.  Gastrointestinal: Negative for nausea, vomiting and abdominal pain.  Genitourinary: Negative for dysuria.    DRUG ALLERGIES:  No Known Allergies  VITALS:  Blood pressure 144/94, pulse 83, temperature 98.9 F (37.2 C), temperature source Oral, resp. rate 18, height 5\' 6"  (1.676 m), weight 76.2 kg (167 lb 15.9 oz), SpO2 100 %.  PHYSICAL EXAMINATION:  GENERAL: 59 y.o.-year-old patient lying in the bed with no acute distress.  EYES: Pupils equal, round, reactive to light and accommodation. No scleral icterus. Extraocular muscles intact.  HEENT: Head atraumatic, normocephalic. Oropharynx and nasopharynx clear. Edentulous, oral mucous membranes pink and moist NECK: Supple, no jugular venous distention. No thyroid enlargement, no tenderness.  LUNGS: Normal breath sounds bilaterally, no wheezing, rales,rhonchi or crepitation. No use of accessory muscles of respiration.  CARDIOVASCULAR: S1, S2 normal. No murmurs, rubs, or gallops.  ABDOMEN: Soft, nontender, slightly distended. Bowel sounds present. No organomegaly or mass. No rebound or guarding EXTREMITIES: No pedal edema, cyanosis, or clubbing.  NEUROLOGIC: Cranial nerves II through XII are intact. Muscle strength 5/5 in all extremities. Sensation intact. Gait not checked. PSYCHIATRIC: The patient is alert and oriented x3, calm SKIN: No obvious rash, lesion, or ulcer.    LABORATORY PANEL:    CBC  Recent Labs Lab 03/12/15 0418  WBC 5.2  HGB 10.9*  HCT 30.9*  PLT 194   ------------------------------------------------------------------------------------------------------------------  Chemistries   Recent Labs Lab 03/11/15 1642  03/12/15 0418  NA 113*  < > 118*  K 3.3*  < > 3.1*  CL 77*  < > 83*  CO2 23  < > 26  GLUCOSE 176*  < > 132*  BUN 41*  < > 36*  CREATININE 2.33*  < > 1.93*  CALCIUM 8.9  < > 8.6*  AST 63*  --   --   ALT 42  --   --   ALKPHOS 51  --   --   BILITOT 0.4  --   --   < > = values in this interval not displayed. ------------------------------------------------------------------------------------------------------------------  Cardiac Enzymes  Recent Labs Lab 03/11/15 1642  TROPONINI <0.03   ------------------------------------------------------------------------------------------------------------------  RADIOLOGY:  Ct Head Wo Contrast  03/11/2015   CLINICAL DATA:  Found unresponsive.  EXAM: CT HEAD WITHOUT CONTRAST  TECHNIQUE: Contiguous axial images were obtained from the base of the skull through the vertex without intravenous contrast.  COMPARISON:  08/07/2014  FINDINGS: There is mild cerebral atrophy. No acute intracranial abnormality. Specifically, no hemorrhage, hydrocephalus, mass lesion, acute infarction, or significant intracranial injury. No acute calvarial abnormality. Visualized paranasal sinuses and mastoids clear. Orbital soft tissues unremarkable.  IMPRESSION: No acute intracranial abnormality.   Electronically Signed   By: Charlett Nose M.D.   On: 03/11/2015 17:27    EKG:  No orders found for this or any previous visit.  ASSESSMENT AND PLAN:   Principal Problem:   Altered mental status Active Problems:   Hyponatremia   Chronic systolic congestive  heart failure   Polysubstance abuse   Alcohol abuse   Hypertension   Diabetes mellitus   Rhabdomyolysis   Acute renal failure superimposed on stage 3 chronic kidney  disease   Problem #1 altered mental status: improving - Multifactorial due to polysubstance abuse, alcohol intoxication and hyponatremia. Could possibly have seized with low NA and be post ictal - CT of the head shows mild cerebral atrophy no acute abnormalities.   Problem #2 acute on chronic hyponatremia: due to beer potomania 3 x 40oz daily, CHF EF 35%, chlorthalidone and SSRI - minimal improvement with IVF, fluid restriction - will give lasix 40 mg IV now and recheck in PM - nephrology consultation pending  Problem #3 chronic systolic congestive heart failure: Ejection fraction 35%.  - No acute exacerbation at this time.  - Continue Coreg. Hold lisinopril due to renal failure. - lasix today - CHF clinic referral  Problem #4 hypertension: fair control - dc chlorthalidone, hold lisinopril - continue coreg  Problem #5 alcohol and polysubstance abuse:  - discussed with patient and his sister today - continue CIWA monitoring - Case management for assistance with PCP and support for substance abuse  #6 rhabdomyolysis: due to AMS syncope fall - CK level 1433 on admission - rechcek now  #7 acute renal failure on chronic kidney disease stage III:  - baseline creatinine is about 1.5 - Cr on admission 2.33>> 1.93 - continue hydration, hold ACE, consult nephrology.   #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 Management plans discussed with the patient, family and they are in agreement.  CODE STATUS: full  TOTAL TIME TAKING CARE OF THIS PATIENT: 35 minutes.   POSSIBLE D/C IN 2 DAYS, DEPENDING ON CLINICAL CONDITION.   Elby Showers M.D on 03/12/2015 at 9:34 AM  Between 7am to 6pm - Pager - [phone]  After 6pm go to [email or web address] - password [email or web address]  [facility]   Office  [phone]  CC: Primary care physician; No primary care provider on file.

## 2015-03-12 NOTE — Progress Notes (Addendum)
MD notified of na 40, Diamond MD to put in fluid restriction order

## 2015-03-12 NOTE — Progress Notes (Signed)
Patient confused throughout shift and did not recall how he got to the hospital and why.  Fluid restriction.  Teley NSR.  No complaints of pain.  Displayed sweats and inability to do serial numbers.  No other withdrawal affects displayed.

## 2015-03-13 LAB — MAGNESIUM: Magnesium: 1.4 mg/dL — ABNORMAL LOW (ref 1.7–2.4)

## 2015-03-13 LAB — GLUCOSE, CAPILLARY
GLUCOSE-CAPILLARY: 143 mg/dL — AB (ref 65–99)
GLUCOSE-CAPILLARY: 153 mg/dL — AB (ref 65–99)
Glucose-Capillary: 160 mg/dL — ABNORMAL HIGH (ref 65–99)
Glucose-Capillary: 172 mg/dL — ABNORMAL HIGH (ref 65–99)

## 2015-03-13 LAB — BASIC METABOLIC PANEL
Anion gap: 5 (ref 5–15)
BUN: 31 mg/dL — ABNORMAL HIGH (ref 6–20)
CHLORIDE: 93 mmol/L — AB (ref 101–111)
CO2: 27 mmol/L (ref 22–32)
CREATININE: 1.74 mg/dL — AB (ref 0.61–1.24)
Calcium: 9 mg/dL (ref 8.9–10.3)
GFR, EST AFRICAN AMERICAN: 47 mL/min — AB (ref >60)
GFR, EST NON AFRICAN AMERICAN: 41 mL/min — AB (ref >60)
GLUCOSE: 146 mg/dL — AB (ref 65–99)
POTASSIUM: 4.2 mmol/L (ref 3.5–5.1)
Sodium: 125 mmol/L — ABNORMAL LOW (ref 135–145)

## 2015-03-13 LAB — CK: Total CK: 493 U/L — ABNORMAL HIGH (ref 49–397)

## 2015-03-13 MED ORDER — MAGNESIUM SULFATE 2 GM/50ML IV SOLN
2.0000 g | Freq: Once | INTRAVENOUS | Status: AC
  Administered 2015-03-13: 2 g via INTRAVENOUS
  Filled 2015-03-13: qty 50

## 2015-03-13 MED ORDER — POLYETHYLENE GLYCOL 3350 17 G PO PACK
17.0000 g | PACK | Freq: Every day | ORAL | Status: DC
  Administered 2015-03-13 – 2015-03-15 (×3): 17 g via ORAL
  Filled 2015-03-13 (×3): qty 1

## 2015-03-13 MED ORDER — FUROSEMIDE 10 MG/ML IJ SOLN
40.0000 mg | Freq: Once | INTRAMUSCULAR | Status: AC
  Administered 2015-03-13: 40 mg via INTRAVENOUS
  Filled 2015-03-13: qty 4

## 2015-03-13 MED ORDER — LISINOPRIL 20 MG PO TABS
20.0000 mg | ORAL_TABLET | Freq: Every day | ORAL | Status: DC
  Administered 2015-03-13 – 2015-03-15 (×3): 20 mg via ORAL
  Filled 2015-03-13 (×3): qty 1

## 2015-03-13 NOTE — Progress Notes (Signed)
Subjective:   [name] presents to [facility] with altered mental status and found on the floor at home by friends. Patient is unable to give much of a history. He was incontinent of bowel and bladder. CPK level elevated.  On Admission, sodium of 113. Today improved to 125 Urine Tox positive for cocaine , blood alcohol positive  Objective:  Vital signs in last 24 hours:  Temp:  [97.9 F (36.6 C)-98.9 F (37.2 C)] 98.4 F (36.9 C) (05/23 1515) Pulse Rate:  [76-81] 78 (05/23 1515) Resp:  [18] 18 (05/23 1515) BP: (118-158)/(75-92) 158/88 mmHg (05/23 1515) SpO2:  [98 %-100 %] 98 % (05/23 1515) Weight:  [75.864 kg (167 lb 4 oz)-75.932 kg (167 lb 6.4 oz)] 75.932 kg (167 lb 6.4 oz) (05/23 0626)  Weight change: -1.157 kg (-2 lb 8.8 oz) Filed Weights   03/12/15 0605 03/13/15 0500 03/13/15 0626  Weight: 76.2 kg (167 lb 15.9 oz) 75.864 kg (167 lb 4 oz) 75.932 kg (167 lb 6.4 oz)    Intake/Output: I/O last 3 completed shifts: In: 7561.1 [I.V.:7561.1] Out: 3225 [Urine:3225]   Intake/Output this shift:  Total I/O In: -  Out: 1150 [Urine:1150]  Physical Exam: General: NAD  Head: Moist oral mucosal membranes  Eyes: Anicteric,   Neck: Supple, trachea midline  Lungs:  Clear to auscultation  Heart: Regular rate and rhythm  Abdomen:  Soft, nontender,   Extremities: no peripheral edema.  Neurologic: Nonfocal, moving all four extremities, conversive          Basic Metabolic Panel:  Recent Labs Lab 03/11/15 1642 03/11/15 1928 03/12/15 0418 03/12/15 1701 03/13/15 0402  NA 113* 115* 118* 120* 125*  K 3.3* 3.4* 3.1* 3.4* 4.2  CL 77* 78* 83* 84* 93*  CO2 23 26 26 28 27   GLUCOSE 176* 167* 132* 202* 146*  BUN 41* 41* 36* 32* 31*  CREATININE 2.33* 2.20* 1.93* 1.85* 1.74*  CALCIUM 8.9 9.0 8.6* 8.8* 9.0  MG  --   --   --   --  1.4*    Liver Function Tests:  Recent Labs Lab 03/11/15 1642  AST 63*  ALT 42  ALKPHOS 51  BILITOT 0.4  PROT 7.2  ALBUMIN 4.1   No results for  input(s): LIPASE, AMYLASE in the last 168 hours. No results for input(s): AMMONIA in the last 168 hours.  CBC:  Recent Labs Lab 03/11/15 1642 03/12/15 0418  WBC 4.1 5.2  NEUTROABS 2.4  --   HGB 11.4* 10.9*  HCT 32.9* 30.9*  MCV 86.1 84.8  PLT 184 194    Cardiac Enzymes:  Recent Labs Lab 03/11/15 1642 03/13/15 0402  CKTOTAL 1433* 493*  CKMB 25.1*  --   TROPONINI <0.03  --     BNP: Invalid input(s): POCBNP  CBG:  Recent Labs Lab 03/12/15 1535 03/12/15 2101 03/13/15 0747 03/13/15 1141 03/13/15 1620  GLUCAP 194* 207* 160* 143* 153*    Microbiology: Results for orders placed or performed in visit on 04/11/14  Wound culture     Status: None   Collection Time: 04/14/14  2:08 PM  Result Value Ref Range Status   Micro Text Report   Final       SOURCE: LEFT 3RD & 4TH TOE    ORGANISM 1                MODERATE GROWTH METHICILLIN RESISTANT STAPH.AUREUS   ORGANISM 2                LIGHT  GROWTH ENTEROCOCCUS FAECALIS   ORGANISM 3                MODERATE GROWTH KLEBSIELLA SPECIES   COMMENT                   -   COMMENT                   WITH NORMAL SKIN FLORA   COMMENT                   NO ANAEROBES ISOLATED IN 4 DAYS   GRAM STAIN                MODERATE WHITE BLOOD CELLS   GRAM STAIN                MANY GRAM POSITIVE COCCI IN PAIRS IN CLUSTERS   GRAM STAIN                FEW GRAM POSITIVE ROD RARE GRAM NEGATIVE ROD   ANTIBIOTIC                    ORG#1    ORG#2    ORG#3    ORG#4     CIPROFLOXACIN                 S                 S        S         CLINDAMYCIN                   S                                    ERYTHROMYCIN                  R                                    GENTAMICIN                    S                 S        S         LEVOFLOXACIN                  S                 S        S          LINEZOLID                     S        S                           OXACILLIN                     R                                    TIGECYCLINE  S                                    VANCOMYCIN                    S                                    CEFOXITIN SCREEN              POSITIVE                             INDUCIBLE CLINDAMYCIN RESISTANNEGATIVE                             TRIMETHOPRIM/SULFAMETHOXAZOLE S                 S        S         AMPICILLIN                             S        R        R         CEFAZOLIN                                       S        S         CEFOXITIN                                       S        S         CEFTAZIDIME                                     S        S         CEFTRIAXONE                                     S        S         IMIPENEM                                        S        S           Wound culture     Status: None   Collection Time: 04/15/14 12:19 PM  Result Value Ref Range Status   Micro Text Report   Final       SOURCE: LEFT THIRD TOE    ORGANISM 1                HEAVY GROWTH METHICILLIN RESISTANT STAPH.AUREUS  ORGANISM 2                LIGHT GROWTH ENTEROBACTER CLOACAE SSP CLOACAE   ORGANISM 3                MODERATE GROWTH ENTEROCOCCUS FAECALIS   ORGANISM 4                HEAVY GROWTH FINEGOLDIA MAGNA   COMMENT                   -   COMMENT                   WITH NORMAL SKIN FLORA   GRAM STAIN                MODERATE WHITE BLOOD CELLS   GRAM STAIN                FEW GRAM POSITIVE COCCI IN PAIRS IN CLUSTERS   GRAM STAIN                MODERATE GRAM NEGATIVE ROD   ANTIBIOTIC                    ORG#1    ORG#2    ORG#3    ORG#4     CIPROFLOXACIN                 S        S                           CLINDAMYCIN                   S                                    ERYTHROMYCIN                  R                                    GENTAMICIN                    S        S                           LEVOFLOXACIN                  S        S                           LINEZOLID                      S                 S                  OXACILLIN                      R  TIGECYCLINE                   S                                    VANCOMYCIN                    S                                    CEFOXITIN SCREEN              POSITIVE                             INDUCIBLE CLINDAMYCIN RESISTANNEGATIVE                             TRIMETHOPRIM/SULFAMETHOXAZOLE S        S                           CEFAZOLIN                              R                           CEFOXITIN                              R                           CEFTAZIDIME                            S                           CEFTRIAXONE                            S                           IMIPENEM                               S                           AMPICILLIN                                      S                  BETA-LACTAMASE  NEGATIVE      Coagulation Studies: No results for input(s): LABPROT, INR in the last 72 hours.  Urinalysis:  Recent Labs  03/11/15 1642  COLORURINE STRAW*  LABSPEC 1.006  PHURINE 6.0  GLUCOSEU 150*  HGBUR 2+*  BILIRUBINUR NEGATIVE  KETONESUR NEGATIVE  PROTEINUR 30*  NITRITE NEGATIVE  LEUKOCYTESUR NEGATIVE      Imaging: No results found.   Medications:   . sodium chloride 10 mL/hr at 03/12/15 1400   . carvedilol  6.25 mg Oral BID WC  . folic acid  1 mg Oral Daily  . heparin  5,000 Units Subcutaneous 3 times per day  . insulin aspart  0-5 Units Subcutaneous QHS  . insulin aspart  0-9 Units Subcutaneous TID WC  . insulin glargine  10 Units Subcutaneous QHS  . lisinopril  20 mg Oral Daily  . magnesium sulfate 1 - 4 g bolus IVPB  2 g Intravenous Once  . multivitamin with minerals  1 tablet Oral Daily  . polyethylene glycol  17 g Oral Daily  . thiamine  100 mg Oral Daily   Or  . thiamine  100 mg Intravenous Daily   acetaminophen **OR** acetaminophen, ondansetron **OR** ondansetron (ZOFRAN) IV  Assessment/ Plan:  [name] is a 59 y.o.  black  male with congestive heart failure, alcohol abuse, hyeprtension, diabetes mellitus type II, history of osteomyelitis MRSA, hyperlipidemia, depression, peripheral neuropathy who presents with acute renal failure on chronic kidney disease and severe hyponatremia.   1. Hyponatremia: severe. History suggestive of Beer Potomania. Improving with IV fluids. Although patient was also on chlorthalidone which may be playing a role. Discontinued now Continue to monitor serum sodium level to prevent overcorrection.   2. Acute renal failure on chronic kidney disease stage III: seems to be secondary to prerenal azotemia. Creatinine improving. Nonoliguric urine output. Rhabdomyolysis also present.  CKD is secondary to alcohol abuse, diabetes and hypertension.    LOS: 2 Maurice Jordan 5/23/20165:31 PM

## 2015-03-13 NOTE — Progress Notes (Signed)
Patient has been repeatedly asking for social services help to pay his rent. I passed on information from [place] in C/M that hospital is not able to help with rent. Gave pt phone # for social services.  Fredric Mare will see him tomorrow.

## 2015-03-13 NOTE — Care Management Note (Signed)
Case Management Note  Patient Details  Name: Maurice Jordan MRN: [id number] Date of Birth: 04-25-1956  Subjective/Objective:   Admitted with altered mental status due to polysubstance abuse, alcohol intoxication and hyponatremia. His CIWA score has been negative. Pt lives at home with his girlfriend. She is currently in jail for the next 30 days. Pt uses a walker. He states he has no home health services and is open to any agency at discharge. Referral to [facility] for psych nursing and CSW. Pt states his PCP is at [facility]. He has medicaid and is well connected with [facility] [employer] [facility].   Pt reports they assist him with his power bill and rent. Denies issues obtaining medications, copays or medical care. He uses the transit bus for transporation. Case discussed with CSW and she will provide pt with outpatient information for his substance abuse. Will follow.              Action/Plan:   Expected Discharge Date:                  Expected Discharge Plan:  Home w Home Health Services  In-House Referral:     Discharge planning Services  CM Consult  Post Acute Care Choice:    Choice offered to:  Patient  DME Arranged:    DME Agency:     HH Arranged:  RN HH Agency:  [facility]  Status of Service:  In process, will continue to follow  Medicare Important Message Given:    Date Medicare IM Given:    Medicare IM give by:    Date Additional Medicare IM Given:    Additional Medicare Important Message give by:     If discussed at Long Length of Stay Meetings, dates discussed:    Additional Comments:  [name], RN 03/13/2015, 3:39 PM

## 2015-03-13 NOTE — Discharge Planning (Signed)
Patient was made an initial appointment at [facility] on March 30, 2015 at 1:00pm. Thank you.

## 2015-03-13 NOTE — Progress Notes (Signed)
Patient has been confused during this shift.  Repeatedly asks about date, how, & why he is here. Has not displayed any CIWA symptoms.  He is on fluid restriction; he has CHF with an EF of 35%.

## 2015-03-13 NOTE — Progress Notes (Signed)
[facility] [place] [facility]   PATIENT NAME: Maurice Jordan    MR#:  [id number]  DATE OF BIRTH:  [birth year]  SUBJECTIVE:   Mental status continues to improve. No acute complaints.    REVIEW OF SYSTEMS:   Review of Systems  Constitutional: Negative for fever.  Respiratory: Negative for shortness of breath.   Cardiovascular: Negative for chest pain and palpitations.  Gastrointestinal: Negative for nausea, vomiting and abdominal pain.  Genitourinary: Negative for dysuria.    DRUG ALLERGIES:  No Known Allergies  VITALS:  Blood pressure 118/79, pulse 77, temperature 97.9 F (36.6 C), temperature source Oral, resp. rate 18, height 5\' 6"  (1.676 m), weight 75.932 kg (167 lb 6.4 oz), SpO2 99 %.  PHYSICAL EXAMINATION:  GENERAL: 59 y.o.-year-old patient lying in the bed with no acute distress.  EYES: Pupils equal, round, reactive to light and accommodation. No scleral icterus. Extraocular muscles intact.  HEENT: Head atraumatic, normocephalic. Oropharynx and nasopharynx clear. Edentulous, oral mucous membranes pink and moist NECK: Supple, no jugular venous distention. No thyroid enlargement, no tenderness.  LUNGS: Normal breath sounds bilaterally, no wheezing, rales,rhonchi or crepitation. No use of accessory muscles of respiration.  CARDIOVASCULAR: S1, S2 normal. No murmurs, rubs, or gallops.  ABDOMEN: Soft, nontender, slightly distended. Bowel sounds present. No organomegaly or mass. No rebound or guarding EXTREMITIES: No pedal edema, cyanosis, or clubbing.  NEUROLOGIC: Cranial nerves II through XII are intact. Muscle strength 5/5 in all extremities. Sensation intact. Gait not checked. PSYCHIATRIC: The patient is alert and oriented x3, calm SKIN: No obvious rash, lesion, or ulcer.    LABORATORY PANEL:   CBC  Recent Labs Lab 03/12/15 0418  WBC 5.2  HGB 10.9*  HCT 30.9*  PLT 194    ------------------------------------------------------------------------------------------------------------------  Chemistries   Recent Labs Lab 03/11/15 1642  03/13/15 0402  NA 113*  < > 125*  K 3.3*  < > 4.2  CL 77*  < > 93*  CO2 23  < > 27  GLUCOSE 176*  < > 146*  BUN 41*  < > 31*  CREATININE 2.33*  < > 1.74*  CALCIUM 8.9  < > 9.0  MG  --   --  1.4*  AST 63*  --   --   ALT 42  --   --   ALKPHOS 51  --   --   BILITOT 0.4  --   --   < > = values in this interval not displayed. ------------------------------------------------------------------------------------------------------------------  Cardiac Enzymes  Recent Labs Lab 03/11/15 1642  TROPONINI <0.03   ------------------------------------------------------------------------------------------------------------------  RADIOLOGY:  Ct Head Wo Contrast  03/11/2015   CLINICAL DATA:  Found unresponsive.  EXAM: CT HEAD WITHOUT CONTRAST  TECHNIQUE: Contiguous axial images were obtained from the base of the skull through the vertex without intravenous contrast.  COMPARISON:  08/07/2014  FINDINGS: There is mild cerebral atrophy. No acute intracranial abnormality. Specifically, no hemorrhage, hydrocephalus, mass lesion, acute infarction, or significant intracranial injury. No acute calvarial abnormality. Visualized paranasal sinuses and mastoids clear. Orbital soft tissues unremarkable.  IMPRESSION: No acute intracranial abnormality.   Electronically Signed   By: Charlett Nose M.D.   On: 03/11/2015 17:27    EKG:   Orders placed or performed during the hospital encounter of 03/11/15  . EKG 12-Lead  . EKG 12-Lead    ASSESSMENT AND PLAN:   Principal Problem:   Altered mental status Active Problems:   Hyponatremia   Chronic systolic congestive heart failure  Polysubstance abuse   Alcohol abuse   Hypertension   Diabetes mellitus   Rhabdomyolysis   Acute renal failure superimposed on stage 3 chronic kidney  disease   Problem #1 altered mental status: improving - Multifactorial due to polysubstance abuse, alcohol intoxication and hyponatremia. Could possibly have seized with low NA and be post ictal - CT of the head shows mild cerebral atrophy no acute abnormalities.   Problem #2 acute on chronic hyponatremia: due to beer potomania 3 x 40oz daily, CHF EF 35%, chlorthalidone and SSRI - Improving slowly with fluid restriction, have stopped normal saline and will give Lasix again today - Appreciate nephrology consultation  Problem #3 chronic systolic congestive heart failure: Ejection fraction 35%.  - No acute exacerbation at this time.  - Continue Coreg. Hold lisinopril due to renal failure. 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 Management plans discussed with the patient, family and they are in agreement.  CODE STATUS: full  TOTAL TIME TAKING CARE OF THIS PATIENT: 35 minutes.   POSSIBLE D/C IN 1 DAYS, DEPENDING ON CLINICAL CONDITION.   Elby Showers M.D on 03/13/2015 at 1:47 PM  Between 7am to 6pm - Pager - [phone]  After 6pm go to [email or web address] - password [email or web address] [facility]  [facility]  Office   [phone]  CC: Primary care physician; No primary care provider on file.

## 2015-03-14 LAB — BASIC METABOLIC PANEL
ANION GAP: 9 (ref 5–15)
Anion gap: 9 (ref 5–15)
BUN: 31 mg/dL — ABNORMAL HIGH (ref 6–20)
BUN: 32 mg/dL — ABNORMAL HIGH (ref 6–20)
CALCIUM: 10 mg/dL (ref 8.9–10.3)
CO2: 26 mmol/L (ref 22–32)
CO2: 29 mmol/L (ref 22–32)
CREATININE: 1.88 mg/dL — AB (ref 0.61–1.24)
CREATININE: 2 mg/dL — AB (ref 0.61–1.24)
Calcium: 10.1 mg/dL (ref 8.9–10.3)
Chloride: 92 mmol/L — ABNORMAL LOW (ref 101–111)
Chloride: 89 mmol/L — ABNORMAL LOW (ref 101–111)
GFR, EST AFRICAN AMERICAN: 43 mL/min — AB (ref >60)
GFR, EST AFRICAN AMERICAN: 40 mL/min — AB (ref >60)
GFR, EST NON AFRICAN AMERICAN: 37 mL/min — AB (ref >60)
GFR, EST NON AFRICAN AMERICAN: 35 mL/min — AB (ref >60)
Glucose, Bld: 147 mg/dL — ABNORMAL HIGH (ref 65–99)
Glucose, Bld: 208 mg/dL — ABNORMAL HIGH (ref 65–99)
Potassium: 4.3 mmol/L (ref 3.5–5.1)
Potassium: 4.2 mmol/L (ref 3.5–5.1)
SODIUM: 127 mmol/L — AB (ref 135–145)
Sodium: 127 mmol/L — ABNORMAL LOW (ref 135–145)

## 2015-03-14 LAB — GLUCOSE, CAPILLARY
GLUCOSE-CAPILLARY: 169 mg/dL — AB (ref 65–99)
Glucose-Capillary: 138 mg/dL — ABNORMAL HIGH (ref 65–99)
Glucose-Capillary: 208 mg/dL — ABNORMAL HIGH (ref 65–99)
Glucose-Capillary: 191 mg/dL — ABNORMAL HIGH (ref 65–99)

## 2015-03-14 MED ORDER — FUROSEMIDE 10 MG/ML IJ SOLN
40.0000 mg | Freq: Once | INTRAMUSCULAR | Status: AC
  Administered 2015-03-14: 40 mg via INTRAVENOUS
  Filled 2015-03-14: qty 4

## 2015-03-14 NOTE — Progress Notes (Signed)
Mr Scheid, alert and oriented, no s/s of confusion this shift. Denies pain. Slept well through the night. Nursing continues to monitor and assist with ADLs.

## 2015-03-14 NOTE — Care Management Note (Signed)
Case Management Note  Patient Details  Name: Maurice Jordan MRN: [id number] Date of Birth: 08/11/1956  Subjective/Objective:    Telephone call received from [place] [facility]. Pt is active with this agency already for nursing and sw. They have increased his nursing visits due to medication issues. WIll need resumption of care orders.                 Action/Plan: Home with Home Health Nursing and SW with [facility]   Expected Discharge Date:                  Expected Discharge Plan:  Home w Home Health Services  In-House Referral:     Discharge planning Services  CM Consult  Post Acute Care Choice:    Choice offered to:  Patient  DME Arranged:    DME Agency:     HH Arranged:  RN HH Agency:  [facility]  Status of Service:  In process, will continue to follow  Medicare Important Message Given:    Date Medicare IM Given:    Medicare IM give by:    Date Additional Medicare IM Given:    Additional Medicare Important Message give by:     If discussed at Long Length of Stay Meetings, dates discussed:    Additional Comments:  [name], RN 03/14/2015, 10:46 AM

## 2015-03-14 NOTE — Progress Notes (Signed)
[facility]  Patient Status: Active (receiving services up to time of hospitalization)  [facility] is providing the following services: RN and MSW  If patient discharges after hours, please call [phone].   Lanae Crumbly 03/14/2015, 10:53 AM

## 2015-03-14 NOTE — Evaluation (Signed)
Physical Therapy Evaluation Patient Details Name: Maurice Jordan MRN: [id number] DOB: [birth year] Today's Date: 03/14/2015   History of Present Illness  Pt is a 59yo black male who joins [place] at [facility] after sustaining a fall in home with AMS. Pt arrived at [facility] with elevated plasma ETOH levels. Pt also positive for cocaine. PMH include DM, CHF, Rhabdo, and ETOH abuse.   Clinical Impression  Pt is A&Ox3 today but still disoriented to time (month and date). Pt presents with mild lethargy, delayed response, dysarthria, and impaired fine motor coordination in the BUE. There are concerns about safety of the patient discharging to home that require additional information about baseline cognitive status and social support. Pt reports a history of 2 falls in the home in last 6 months. Patient presents with impairment of strength, pain, balance, and activity tolerance, limiting ability to perform ADL, IADL, and ambulation. Patient will benefit from skilled intervention to address the above impairments and limitations, in order to restore to prior level of function, improve safety in home, and to decrease caregiver burden.      Follow Up Recommendations Home health PT    Equipment Recommendations  Cane    Recommendations for Other Services       Precautions / Restrictions Precautions Precautions: Fall Precaution Comments: Sitter; no longer on CIWA Restrictions Weight Bearing Restrictions: No      Mobility  Bed Mobility Overal bed mobility: Modified Independent             General bed mobility comments: labored and slow  Transfers Overall transfer level: Needs assistance Equipment used: None Transfers: Sit to/from Stand Sit to Stand: Min assist         General transfer comment: difficulty rising from low surface.   Ambulation/Gait Ambulation/Gait assistance: Min guard Ambulation Distance (Feet): 30 Feet Assistive device:  (IV pole ) Gait Pattern/deviations: Step-to  pattern;Decreased weight shift to right;Ataxic;Decreased stance time - left;Staggering left   Gait velocity interpretation: <1.8 ft/sec, indicative of risk for recurrent falls General Gait Details: slow and ataxic; pt appears unsteady on feet.   Stairs            Wheelchair Mobility    Modified Rankin (Stroke Patients Only)       Balance Overall balance assessment: Needs assistance Sitting-balance support: No upper extremity supported Sitting balance-Leahy Scale: Good Sitting balance - Comments: R flank pain with stabilization   Standing balance support: No upper extremity supported Standing balance-Leahy Scale: Fair Standing balance comment: LOB with turning in place, turning in circles bilat, and marching in place. Reports dizziness with activity.              High level balance activites: Turns;Head turns High Level Balance Comments: LOB and/or dizziness, able to self correct.              Pertinent Vitals/Pain Pain Assessment: Faces Faces Pain Scale: Hurts little more Pain Location: R lateral flank, worse with rib springing.  Pain Descriptors / Indicators: Aching Pain Intervention(s): Limited activity within patient's tolerance;Monitored during session    Home Living Family/patient expects to be discharged to:: Private residence Living Arrangements: Spouse/significant other (Live with girlfriend, who is currently serving 30 days in county jail. ) Available Help at Discharge: Other (Comment) (Pt is inconsistent in history, and somewhat confused. Unable to affirm addional social support. ) Type of Home: Apartment Home Access: Level entry     Home Layout: One level Home Equipment: Walker - 2 wheels Additional Comments: Pt states he would  like a cane.    Prior Function Level of Independence: Independent with assistive device(s)         Comments: Unable to explain when and whether he uses RW, but ambulates to make groceries.      Hand Dominance         Extremity/Trunk Assessment   Upper Extremity Assessment: RUE deficits/detail;LUE deficits/detail RUE Deficits / Details: 5x finger to nose in 12 seconds; serial digital opposition requires maxmial concentration and is slow     LUE Deficits / Details: 5x finger to nose in 14 seconds; serial digital opposition requires maxmial concentration and is slow   Lower Extremity Assessment: Generalized weakness (MMT screnns 5/5, but weakness with transfers and functional mobility noted. )         Communication   Communication:  (dysarthric and delayed speech)  Cognition Arousal/Alertness: Lethargic Behavior During Therapy: Flat affect Overall Cognitive Status: No family/caregiver present to determine baseline cognitive functioning                      General Comments      Exercises        Assessment/Plan    PT Assessment Patient needs continued PT services  PT Diagnosis Difficulty walking;Abnormality of gait;Altered mental status   PT Problem List Decreased strength;Decreased knowledge of use of DME;Decreased safety awareness;Decreased balance;Decreased coordination;Pain  PT Treatment Interventions DME instruction;Gait training;Balance training;Cognitive remediation;Functional mobility training;Therapeutic exercise;Manual techniques   PT Goals (Current goals can be found in the Care Plan section) Acute Rehab PT Goals Patient Stated Goal: Pt would like to go home soon.  PT Goal Formulation: With patient Time For Goal Achievement: 03/28/15 Potential to Achieve Goals: Good    Frequency Min 2X/week   Barriers to discharge Decreased caregiver support      Co-evaluation               End of Session   Activity Tolerance: Patient tolerated treatment well Patient left: in bed;with call bell/phone within reach;with bed alarm set Nurse Communication: Other (comment) (IV  is turned off. )         Time: 9604-5409 PT Time Calculation (min) (ACUTE ONLY): 22  min   Charges:   PT Evaluation $Initial PT Evaluation Tier I: 1 Procedure     PT G Codes:        Adonai Helzer C 2015/04/05, 4:35 PM  Rosamaria Lints, PT, DPT [place] License # [id number] 05-Apr-2015  , 4:39 PM

## 2015-03-14 NOTE — Care Management Note (Signed)
Case Management Note  Patient Details  Name: Sheamus Waggener MRN: [id number] Date of Birth: 10/08/1956  Subjective/Objective:    [facility] unable to accept pt. [facility] nursing and SW. CIWA remains negative.               Action/Plan:   Expected Discharge Date:                  Expected Discharge Plan:  Home w Home Health Services  In-House Referral:     Discharge planning Services  CM Consult  Post Acute Care Choice:    Choice offered to:  Patient  DME Arranged:    DME Agency:     HH Arranged:  RN HH Agency:  [facility]  Status of Service:  In process, will continue to follow  Medicare Important Message Given:    Date Medicare IM Given:    Medicare IM give by:    Date Additional Medicare IM Given:    Additional Medicare Important Message give by:     If discussed at Long Length of Stay Meetings, dates discussed:    Additional Comments:  [name], RN 03/14/2015, 9:47 AM

## 2015-03-14 NOTE — Progress Notes (Signed)
[facility] [place] [facility]   PATIENT NAME: Maurice Jordan    MR#:  [id number]  DATE OF BIRTH:  Jun 18, 1956  SUBJECTIVE:   Alert, smiling, sitting up in the bed  REVIEW OF SYSTEMS:   Review of Systems  Constitutional: Negative for fever.  Respiratory: Negative for shortness of breath.   Cardiovascular: Negative for chest pain and palpitations.  Gastrointestinal: Negative for nausea, vomiting and abdominal pain.  Genitourinary: Negative for dysuria.    DRUG ALLERGIES:  No Known Allergies  VITALS:  Blood pressure 124/72, pulse 80, temperature 98.1 F (36.7 C), temperature source Oral, resp. rate 19, height 5\' 6"  (1.676 m), weight 74.98 kg (165 lb 4.8 oz), SpO2 97 %.  PHYSICAL EXAMINATION:  GENERAL: 59 y.o.-year-old patient lying in the bed with no acute distress.  EYES: Pupils equal, round, reactive to light and accommodation. No scleral icterus. Extraocular muscles intact.  HEENT: Head atraumatic, normocephalic. Oropharynx and nasopharynx clear. Edentulous, oral mucous membranes pink and moist NECK: Supple, no jugular venous distention. No thyroid enlargement, no tenderness.  LUNGS: Normal breath sounds bilaterally, no wheezing, rales,rhonchi or crepitation. No use of accessory muscles of respiration.  CARDIOVASCULAR: S1, S2 normal. No murmurs, rubs, or gallops.  ABDOMEN: Soft, nontender, slightly distended. Bowel sounds present. No organomegaly or mass. No rebound or guarding EXTREMITIES: No pedal edema, cyanosis, or clubbing.  NEUROLOGIC: Cranial nerves II through XII are intact. Muscle strength 5/5 in all extremities. Sensation intact. Gait not checked. PSYCHIATRIC: The patient is alert and oriented x3, calm SKIN: No obvious rash, lesion, or ulcer.    LABORATORY PANEL:   CBC  Recent Labs Lab 03/12/15 0418  WBC 5.2  HGB 10.9*  HCT 30.9*  PLT 194    ------------------------------------------------------------------------------------------------------------------  Chemistries   Recent Labs Lab 03/11/15 1642  03/13/15 0402 03/14/15 0523  NA 113*  < > 125* 127*  K 3.3*  < > 4.2 4.3  CL 77*  < > 93* 92*  CO2 23  < > 27 26  GLUCOSE 176*  < > 146* 147*  BUN 41*  < > 31* 31*  CREATININE 2.33*  < > 1.74* 1.88*  CALCIUM 8.9  < > 9.0 10.1  MG  --   --  1.4*  --   AST 63*  --   --   --   ALT 42  --   --   --   ALKPHOS 51  --   --   --   BILITOT 0.4  --   --   --   < > = values in this interval not displayed. ------------------------------------------------------------------------------------------------------------------  Cardiac Enzymes  Recent Labs Lab 03/11/15 1642  TROPONINI <0.03   ------------------------------------------------------------------------------------------------------------------  RADIOLOGY:  No results found.  EKG:   Orders placed or performed during the hospital encounter of 03/11/15  . EKG 12-Lead  . EKG 12-Lead    ASSESSMENT AND PLAN:   Principal Problem:   Altered mental status Active Problems:   Hyponatremia   Chronic systolic congestive heart failure   Polysubstance abuse   Alcohol abuse   Hypertension   Diabetes mellitus   Rhabdomyolysis   Acute renal failure superimposed on stage 3 chronic kidney disease   Problem #1 altered mental status: improving - Multifactorial due to polysubstance abuse, alcohol intoxication and hyponatremia. Could possibly have seized with low NA and be post ictal - CT of the head shows mild cerebral atrophy no acute abnormalities.  - Think he is back to his  baseline today  Problem #2 acute on chronic hyponatremia: due to beer potomania 3 x 40oz daily, CHF EF 35%, chlorthalidone and SSRI - Improving slowly with fluid restriction, have restarted normal saline and will give Lasix again today - Appreciate nephrology consultation - Would like to see  serum sodium at least 130 before discharge, currently 127 - Discussed fluid restriction with the patient again today  Problem #3 chronic systolic congestive heart failure: Ejection fraction 35%.  - No acute exacerbation at this time.  - Continue Coreg and lisinopril - lasix today again - CHF clinic referral  Problem #4 hypertension: fair control - dc chlorthalidone - continue coreg, restart lisinopril  Problem #5 alcohol and polysubstance abuse:  - discussed with patient and his sister - Discontinue CIWA monitoring, he has not needed any Ativan - Case management for assistance with PCP and support for substance abuse  #6 rhabdomyolysis: due to AMS syncope fall - CK level 1433 on admission--493, improving  #7 acute renal failure on chronic kidney disease stage III:  - baseline creatinine is about 1.5 - Cr on admission 2.33>> 1.93--1.74--1.8 today  #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 Management plans discussed with the patient, family and they are in agreement.  CODE STATUS: full  TOTAL TIME TAKING CARE OF THIS PATIENT: 35 minutes.   POSSIBLE D/C IN 1 DAYS, DEPENDING ON CLINICAL CONDITION.   Elby Showers M.D on 03/14/2015 at 1:10 PM  Between 7am to 6pm - Pager - [phone]  After 6pm go to [email or web address] - password [email or web address] [facility]  [place] [facility]  Office  [phone]  CC: Primary care physician; No primary care provider on file.

## 2015-03-14 NOTE — Progress Notes (Signed)
Subjective:   Mental status much improved, pt is alert and communicating normally Ate breakfast 100% No nausea or vomiting Ser Cr slightly worse as compared to yesterday  Urine output 2550 cc   Objective:  Vital signs in last 24 hours:  Temp:  [97.7 F (36.5 C)-98.7 F (37.1 C)] 98.1 F (36.7 C) (05/24 1115) Pulse Rate:  [72-80] 80 (05/24 1115) Resp:  [18-19] 19 (05/24 1115) BP: (95-158)/(67-88) 124/72 mmHg (05/24 1115) SpO2:  [97 %-99 %] 97 % (05/24 0746) Weight:  [74.98 kg (165 lb 4.8 oz)] 74.98 kg (165 lb 4.8 oz) (05/24 0101)  Weight change: -0.885 kg (-1 lb 15.2 oz) Filed Weights   03/13/15 0500 03/13/15 0626 03/14/15 0101  Weight: 75.864 kg (167 lb 4 oz) 75.932 kg (167 lb 6.4 oz) 74.98 kg (165 lb 4.8 oz)    Intake/Output: I/O last 3 completed shifts: In: 5997.8 [I.V.:5997.8] Out: 3600 [Urine:3600]   Intake/Output this shift:  Total I/O In: -  Out: 525 [Urine:525]  Physical Exam: General: NAD  Head: Moist oral mucosal membranes  Eyes: Anicteric,   Neck: Supple, trachea midline  Lungs:  Clear to auscultation  Heart: Regular rate and rhythm  Abdomen:  Soft, nontender,   Extremities: no peripheral edema.  Neurologic: Nonfocal, moving all four extremities, alert and oriented          Basic Metabolic Panel:  Recent Labs Lab 03/11/15 1928 03/12/15 0418 03/12/15 1701 03/13/15 0402 03/14/15 0523  NA 115* 118* 120* 125* 127*  K 3.4* 3.1* 3.4* 4.2 4.3  CL 78* 83* 84* 93* 92*  CO2 26 26 28 27 26   GLUCOSE 167* 132* 202* 146* 147*  BUN 41* 36* 32* 31* 31*  CREATININE 2.20* 1.93* 1.85* 1.74* 1.88*  CALCIUM 9.0 8.6* 8.8* 9.0 10.1  MG  --   --   --  1.4*  --     Liver Function Tests:  Recent Labs Lab 03/11/15 1642  AST 63*  ALT 42  ALKPHOS 51  BILITOT 0.4  PROT 7.2  ALBUMIN 4.1   No results for input(s): LIPASE, AMYLASE in the last 168 hours. No results for input(s): AMMONIA in the last 168 hours.  CBC:  Recent Labs Lab 03/11/15 1642  03/12/15 0418  WBC 4.1 5.2  NEUTROABS 2.4  --   HGB 11.4* 10.9*  HCT 32.9* 30.9*  MCV 86.1 84.8  PLT 184 194    Cardiac Enzymes:  Recent Labs Lab 03/11/15 1642 03/13/15 0402  CKTOTAL 1433* 493*  CKMB 25.1*  --   TROPONINI <0.03  --     BNP: Invalid input(s): POCBNP  CBG:  Recent Labs Lab 03/13/15 1141 03/13/15 1620 03/13/15 2128 03/14/15 0739 03/14/15 1126  GLUCAP 143* 153* 172* 138* 208*    Microbiology: Results for orders placed or performed in visit on 04/11/14  Wound culture     Status: None   Collection Time: 04/14/14  2:08 PM  Result Value Ref Range Status   Micro Text Report   Final       SOURCE: LEFT 3RD & 4TH TOE    ORGANISM 1                MODERATE GROWTH METHICILLIN RESISTANT STAPH.AUREUS   ORGANISM 2                LIGHT GROWTH ENTEROCOCCUS FAECALIS   ORGANISM 3                MODERATE GROWTH KLEBSIELLA SPECIES  COMMENT                   -   COMMENT                   WITH NORMAL SKIN FLORA   COMMENT                   NO ANAEROBES ISOLATED IN 4 DAYS   GRAM STAIN                MODERATE WHITE BLOOD CELLS   GRAM STAIN                MANY GRAM POSITIVE COCCI IN PAIRS IN CLUSTERS   GRAM STAIN                FEW GRAM POSITIVE ROD RARE GRAM NEGATIVE ROD   ANTIBIOTIC                    ORG#1    ORG#2    ORG#3    ORG#4     CIPROFLOXACIN                 S                 S        S         CLINDAMYCIN                   S                                    ERYTHROMYCIN                  R                                    GENTAMICIN                    S                 S        S         LEVOFLOXACIN                  S                 S        S          LINEZOLID                     S        S                           OXACILLIN                     R                                    TIGECYCLINE                   S  VANCOMYCIN                    S                                    CEFOXITIN SCREEN               POSITIVE                             INDUCIBLE CLINDAMYCIN RESISTANNEGATIVE                             TRIMETHOPRIM/SULFAMETHOXAZOLE S                 S        S         AMPICILLIN                             S        R        R         CEFAZOLIN                                       S        S         CEFOXITIN                                       S        S         CEFTAZIDIME                                     S        S         CEFTRIAXONE                                     S        S         IMIPENEM                                        S        S           Wound culture     Status: None   Collection Time: 04/15/14 12:19 PM  Result Value Ref Range Status   Micro Text Report   Final       SOURCE: LEFT THIRD TOE    ORGANISM 1                HEAVY GROWTH METHICILLIN RESISTANT STAPH.AUREUS   ORGANISM 2                LIGHT GROWTH ENTEROBACTER CLOACAE SSP CLOACAE   ORGANISM 3  MODERATE GROWTH ENTEROCOCCUS FAECALIS   ORGANISM 4                HEAVY GROWTH FINEGOLDIA MAGNA   COMMENT                   -   COMMENT                   WITH NORMAL SKIN FLORA   GRAM STAIN                MODERATE WHITE BLOOD CELLS   GRAM STAIN                FEW GRAM POSITIVE COCCI IN PAIRS IN CLUSTERS   GRAM STAIN                MODERATE GRAM NEGATIVE ROD   ANTIBIOTIC                    ORG#1    ORG#2    ORG#3    ORG#4     CIPROFLOXACIN                 S        S                           CLINDAMYCIN                   S                                    ERYTHROMYCIN                  R                                    GENTAMICIN                    S        S                           LEVOFLOXACIN                  S        S                           LINEZOLID                      S                 S                  OXACILLIN                     R                                    TIGECYCLINE                   S  VANCOMYCIN                    S                                     CEFOXITIN SCREEN              POSITIVE                             INDUCIBLE CLINDAMYCIN RESISTANNEGATIVE                             TRIMETHOPRIM/SULFAMETHOXAZOLE S        S                           CEFAZOLIN                              R                           CEFOXITIN                              R                           CEFTAZIDIME                            S                           CEFTRIAXONE                            S                           IMIPENEM                               S                           AMPICILLIN                                      S                  BETA-LACTAMASE                                           NEGATIVE      Coagulation Studies: No results for input(s): LABPROT, INR in the last 72 hours.  Urinalysis:  Recent Labs  03/11/15 1642  COLORURINE STRAW*  LABSPEC 1.006  PHURINE 6.0  GLUCOSEU 150*  HGBUR 2+*  BILIRUBINUR NEGATIVE  KETONESUR NEGATIVE  PROTEINUR 30*  NITRITE NEGATIVE  LEUKOCYTESUR NEGATIVE      Imaging: No results found.   Medications:   . sodium chloride 75 mL/hr at 03/14/15 0809   . carvedilol  6.25 mg Oral BID WC  . folic acid  1 mg Oral Daily  . heparin  5,000 Units Subcutaneous 3 times per day  . insulin aspart  0-5 Units Subcutaneous QHS  . insulin aspart  0-9 Units Subcutaneous TID WC  . insulin glargine  10 Units Subcutaneous QHS  . lisinopril  20 mg Oral Daily  . multivitamin with minerals  1 tablet Oral Daily  . polyethylene glycol  17 g Oral Daily  . thiamine  100 mg Oral Daily   Or  . thiamine  100 mg Intravenous Daily   acetaminophen **OR** acetaminophen, ondansetron **OR** ondansetron (ZOFRAN) IV  Assessment/ Plan:  Maurice Jordan is a 59 y.o. black  male with congestive heart failure, alcohol abuse, hyeprtension, diabetes mellitus type II, history of osteomyelitis MRSA, hyperlipidemia, depression, peripheral neuropathy who presents with acute renal  failure on chronic kidney disease and severe hyponatremia.   1. Hyponatremia: severe. History suggestive of Beer Potomania. Improving with IV fluids. Although patient was also on chlorthalidone which may be playing a role. Discontinued now Continue to monitor serum sodium level to prevent overcorrection.  Supportive care.  Follow up outpatient  2. Acute renal failure on chronic kidney disease stage III: seems to be secondary to prerenal azotemia. Creatinine improving. Nonoliguric urine output. Rhabdomyolysis also present.  CKD is secondary to alcohol abuse, diabetes and hypertension.    LOS: 3 Norah Fick 5/24/201611:55 AM

## 2015-03-14 NOTE — Progress Notes (Signed)
Patient has been alert & oriented.VSS this shift.  Worked well with PT today. Patient no longer on CIWA. NSR on tele.

## 2015-03-14 NOTE — Clinical Social Work Note (Signed)
Clinical Social Work Assessment  Patient Details  Name: Maurice Jordan MRN: [id number] Date of Birth: 01-11-1956  Date of referral:  03/14/15               Reason for consult:  Financial Concerns, Substance Use/ETOH Abuse                Permission sought to share information with:  Case Manager Permission granted to share information::  Yes, Verbal Permission Granted  Name::      Physicist, medical::   RN Case Manager  Relationship::     Contact Information:     Housing/Transportation Living arrangements for the past 2 months:  Single Family Home Source of Information:  Patient Patient Interpreter Needed:  None Criminal Activity/Legal Involvement Pertinent to Current Situation/Hospitalization:  No - Comment as needed Significant Relationships:  Significant Other Lives with:  Self Do you feel safe going back to the place where you live?  Yes Need for family participation in patient care:  No (Coment)  Care giving concerns: Patient lives alone in [place].    Facilities manager / plan: Holiday representative (CSW) met with patient to complete assessment. CSW introduced self and explained role of CSW department. Patient reported that he lives with his girlfriend in [place]. Per patient his girlfriend is in the county jail for 30 days. Patient reported that he used cocaine a week ago when he was with his step daughter. Patient reported that once his girlfriend is out of jail "things will go back to normal." Patient reported that he was "done using drugs." Patient reported that he has not used any drugs in years before last week. Patient denied alcohol use. Patient reported that he needed some help paying his rent. CSW provided patient will a packet of [place] resources including [place]. CSW provided patient emotional support and encouragement. Please reconsult if future social work needs arise. CSW signing off.  Blima Rich, LCSWA [phone]      Employment status:  Retired Forensic scientist:  [place] PT Recommendations:  Home with [place] / Referral to community resources:  Outpatient Substance Abuse Treatment Options  Patient/Family's Response to care: Patient reported that he wants to go home.   Patient/Family's Understanding of and Emotional Response to Diagnosis, Current Treatment, and Prognosis: Patient was pleasant and thanked CSW for visit and providing resources.   Emotional Assessment Appearance:  Appears stated age Attitude/Demeanor/Rapport:    Affect (typically observed):  Accepting, Calm Orientation:  Oriented to Self, Oriented to Place, Oriented to  Time Alcohol / Substance use:  Illicit Drugs Psych involvement (Current and /or in the community):  No (Comment)  Discharge Needs  Concerns to be addressed:  Discharge Planning Concerns Readmission within the last 30 days:    Current discharge risk:  Lack of support system, Substance Abuse Barriers to Discharge:  Continued Medical Work up   Elwyn Reach 03/14/2015, 4:39 PM

## 2015-03-15 LAB — BASIC METABOLIC PANEL
Anion gap: 7 (ref 5–15)
BUN: 40 mg/dL — AB (ref 6–20)
CO2: 28 mmol/L (ref 22–32)
Calcium: 9.5 mg/dL (ref 8.9–10.3)
Chloride: 94 mmol/L — ABNORMAL LOW (ref 101–111)
Creatinine, Ser: 1.94 mg/dL — ABNORMAL HIGH (ref 0.61–1.24)
GFR calc Af Amer: 42 mL/min — ABNORMAL LOW (ref >60)
GFR calc non Af Amer: 36 mL/min — ABNORMAL LOW (ref >60)
Glucose, Bld: 158 mg/dL — ABNORMAL HIGH (ref 65–99)
Potassium: 4.2 mmol/L (ref 3.5–5.1)
Sodium: 129 mmol/L — ABNORMAL LOW (ref 135–145)

## 2015-03-15 LAB — GLUCOSE, CAPILLARY
GLUCOSE-CAPILLARY: 157 mg/dL — AB (ref 65–99)
GLUCOSE-CAPILLARY: 199 mg/dL — AB (ref 65–99)
Glucose-Capillary: 151 mg/dL — ABNORMAL HIGH (ref 65–99)

## 2015-03-15 MED ORDER — LISINOPRIL 20 MG PO TABS
20.0000 mg | ORAL_TABLET | Freq: Every day | ORAL | Status: DC
Start: 2015-03-15 — End: 2017-05-20

## 2015-03-15 MED ORDER — INSULIN GLARGINE 100 UNIT/ML ~~LOC~~ SOLN: 10.0000 [IU] | Freq: Every day | SUBCUTANEOUS | Status: DC

## 2015-03-15 NOTE — Progress Notes (Signed)
Pt has been A&O this shift. VSS, no complaints of pain. Will continue to monitor

## 2015-03-15 NOTE — Discharge Instructions (Signed)
°  DIET:  Cardiac diet, less than 2 g of sodium, less then 1200 mL of fluid  DISCHARGE CONDITION:  Fair  ACTIVITY:  Activity as tolerated  OXYGEN:  Home Oxygen: No.   Oxygen Delivery: room air  DISCHARGE LOCATION:  home with home health nursing and social work  If you experience worsening of your admission symptoms, develop shortness of breath, life threatening emergency, suicidal or homicidal thoughts you must seek medical attention immediately by calling 911 or calling your MD immediately  if symptoms less severe.  You Must read complete instructions/literature along with all the possible adverse reactions/side effects for all the Medicines you take and that have been prescribed to you. Take any new Medicines after you have completely understood and accpet all the possible adverse reactions/side effects.   Please note  You were cared for by a hospitalist during your hospital stay. If you have any questions about your discharge medications or the care you received while you were in the hospital after you are discharged, you can call the unit and asked to speak with the hospitalist on call if the hospitalist that took care of you is not available. Once you are discharged, your primary care physician will handle any further medical issues. Please note that NO REFILLS for any discharge medications will be authorized once you are discharged, as it is imperative that you return to your primary care physician (or establish a relationship with a primary care physician if you do not have one) for your aftercare needs so that they can reassess your need for medications and monitor your lab values.

## 2015-03-15 NOTE — Progress Notes (Signed)
Pt alert and oriented. Pt has not needed any coverage for CIWA. IV infusing without difficulty. Using urinal to void. Remaining on fluid restriction. Able to sleep in between care.  Sheron Nightingale RN

## 2015-03-15 NOTE — Clinical Documentation Improvement (Signed)
Per MD Progress Notes: "altered mental status : improving -Multifactorial due to polysubstance abuse, alcohol intoxication and hyponatremia. Could possibly have seized with low NA and be post ictal"   Is it possible to further specify this patient's altered mental status, for example:  Encepahlopathy Encephalopathy and altered mental status Altered mental status only Unable to suspect or determine Other, please specify in your notes.   Thank You,  Alesia Richards, RN CDS [facility] Health Information Management [email or web address] [phone]

## 2015-03-15 NOTE — Progress Notes (Signed)
Subjective:   UOP 2225 cc Na improved to 129 Cr 1.94 (2.00) Alert. Able to com,unicate  Objective:  Vital signs in last 24 hours:  Temp:  [97.9 F (36.6 C)-98.8 F (37.1 C)] 98.8 F (37.1 C) (05/25 0732) Pulse Rate:  [74-80] 77 (05/25 0732) Resp:  [18-19] 18 (05/25 0732) BP: (97-130)/(65-87) 120/70 mmHg (05/25 0928) SpO2:  [98 %-100 %] 100 % (05/25 0732) Weight:  [75.751 kg (167 lb)] 75.751 kg (167 lb) (05/25 0500)  Weight change: 0.771 kg (1 lb 11.2 oz) Filed Weights   03/13/15 0626 03/14/15 0101 03/15/15 0500  Weight: 75.932 kg (167 lb 6.4 oz) 74.98 kg (165 lb 4.8 oz) 75.751 kg (167 lb)    Intake/Output: I/O last 3 completed shifts: In: 72.5 [I.V.:72.5] Out: 2925 [Urine:2925]   Intake/Output this shift:  Total I/O In: -  Out: 400 [Urine:400]  Physical Exam: General: NAD  Head: Moist oral mucosal membranes  Eyes: Anicteric,   Neck: Supple, trachea midline  Lungs:  Clear to auscultation  Heart: Regular rate and rhythm  Abdomen:  Soft, nontender,   Extremities: no peripheral edema.  Neurologic: Nonfocal, moving all four extremities, alert and oriented          Basic Metabolic Panel:  Recent Labs Lab 03/12/15 1701 03/13/15 0402 03/14/15 0523 03/14/15 1412 03/15/15 0445  NA 120* 125* 127* 127* 129*  K 3.4* 4.2 4.3 4.2 4.2  CL 84* 93* 92* 89* 94*  CO2 28 27 26 29 28   GLUCOSE 202* 146* 147* 208* 158*  BUN 32* 31* 31* 32* 40*  CREATININE 1.85* 1.74* 1.88* 2.00* 1.94*  CALCIUM 8.8* 9.0 10.1 10.0 9.5  MG  --  1.4*  --   --   --     Liver Function Tests:  Recent Labs Lab 03/11/15 1642  AST 63*  ALT 42  ALKPHOS 51  BILITOT 0.4  PROT 7.2  ALBUMIN 4.1   No results for input(s): LIPASE, AMYLASE in the last 168 hours. No results for input(s): AMMONIA in the last 168 hours.  CBC:  Recent Labs Lab 03/11/15 1642 03/12/15 0418  WBC 4.1 5.2  NEUTROABS 2.4  --   HGB 11.4* 10.9*  HCT 32.9* 30.9*  MCV 86.1 84.8  PLT 184 194    Cardiac  Enzymes:  Recent Labs Lab 03/11/15 1642 03/13/15 0402  CKTOTAL 1433* 493*  CKMB 25.1*  --   TROPONINI <0.03  --     BNP: Invalid input(s): POCBNP  CBG:  Recent Labs Lab 03/14/15 0739 03/14/15 1126 03/14/15 1625 03/14/15 2135 03/15/15 0734  GLUCAP 138* 208* 169* 191* 157*    Microbiology: Results for orders placed or performed in visit on 04/11/14  Wound culture     Status: None   Collection Time: 04/14/14  2:08 PM  Result Value Ref Range Status   Micro Text Report   Final       SOURCE: LEFT 3RD & 4TH TOE    ORGANISM 1                MODERATE GROWTH METHICILLIN RESISTANT STAPH.AUREUS   ORGANISM 2                LIGHT GROWTH ENTEROCOCCUS FAECALIS   ORGANISM 3                MODERATE GROWTH KLEBSIELLA SPECIES   COMMENT                   -   COMMENT  WITH NORMAL SKIN FLORA   COMMENT                   NO ANAEROBES ISOLATED IN 4 DAYS   GRAM STAIN                MODERATE WHITE BLOOD CELLS   GRAM STAIN                MANY GRAM POSITIVE COCCI IN PAIRS IN CLUSTERS   GRAM STAIN                FEW GRAM POSITIVE ROD RARE GRAM NEGATIVE ROD   ANTIBIOTIC                    ORG#1    ORG#2    ORG#3    ORG#4     CIPROFLOXACIN                 S                 S        S         CLINDAMYCIN                   S                                    ERYTHROMYCIN                  R                                    GENTAMICIN                    S                 S        S         LEVOFLOXACIN                  S                 S        S          LINEZOLID                     S        S                           OXACILLIN                     R                                    TIGECYCLINE                   S                                    VANCOMYCIN  S                                    CEFOXITIN SCREEN              POSITIVE                             INDUCIBLE CLINDAMYCIN RESISTANNEGATIVE                              TRIMETHOPRIM/SULFAMETHOXAZOLE S                 S        S         AMPICILLIN                             S        R        R         CEFAZOLIN                                       S        S         CEFOXITIN                                       S        S         CEFTAZIDIME                                     S        S         CEFTRIAXONE                                     S        S         IMIPENEM                                        S        S           Wound culture     Status: None   Collection Time: 04/15/14 12:19 PM  Result Value Ref Range Status   Micro Text Report   Final       SOURCE: LEFT THIRD TOE    ORGANISM 1                HEAVY GROWTH METHICILLIN RESISTANT STAPH.AUREUS   ORGANISM 2                LIGHT GROWTH ENTEROBACTER CLOACAE SSP CLOACAE   ORGANISM 3                MODERATE GROWTH ENTEROCOCCUS FAECALIS   ORGANISM 4  HEAVY GROWTH FINEGOLDIA MAGNA   COMMENT                   -   COMMENT                   WITH NORMAL SKIN FLORA   GRAM STAIN                MODERATE WHITE BLOOD CELLS   GRAM STAIN                FEW GRAM POSITIVE COCCI IN PAIRS IN CLUSTERS   GRAM STAIN                MODERATE GRAM NEGATIVE ROD   ANTIBIOTIC                    ORG#1    ORG#2    ORG#3    ORG#4     CIPROFLOXACIN                 S        S                           CLINDAMYCIN                   S                                    ERYTHROMYCIN                  R                                    GENTAMICIN                    S        S                           LEVOFLOXACIN                  S        S                           LINEZOLID                      S                 S                  OXACILLIN                     R                                    TIGECYCLINE                   S  VANCOMYCIN                    S                                    CEFOXITIN SCREEN              POSITIVE                              INDUCIBLE CLINDAMYCIN RESISTANNEGATIVE                             TRIMETHOPRIM/SULFAMETHOXAZOLE S        S                           CEFAZOLIN                              R                           CEFOXITIN                              R                           CEFTAZIDIME                            S                           CEFTRIAXONE                            S                           IMIPENEM                               S                           AMPICILLIN                                      S                  BETA-LACTAMASE                                           NEGATIVE      Coagulation Studies: No results for input(s): LABPROT, INR in the last 72 hours.  Urinalysis: No results for input(s): COLORURINE, LABSPEC, PHURINE, GLUCOSEU, HGBUR, BILIRUBINUR, KETONESUR, PROTEINUR, UROBILINOGEN, NITRITE, LEUKOCYTESUR in the last 72 hours.  Invalid input(s): APPERANCEUR    Imaging: No results  found.   Medications:   . sodium chloride 75 mL/hr at 03/15/15 0912   . carvedilol  6.25 mg Oral BID WC  . folic acid  1 mg Oral Daily  . heparin  5,000 Units Subcutaneous 3 times per day  . insulin aspart  0-5 Units Subcutaneous QHS  . insulin aspart  0-9 Units Subcutaneous TID WC  . insulin glargine  10 Units Subcutaneous QHS  . lisinopril  20 mg Oral Daily  . multivitamin with minerals  1 tablet Oral Daily  . polyethylene glycol  17 g Oral Daily  . thiamine  100 mg Oral Daily   Or  . thiamine  100 mg Intravenous Daily   acetaminophen **OR** acetaminophen, ondansetron **OR** ondansetron (ZOFRAN) IV  Assessment/ Plan:  [name] is a 59 y.o. black  male with congestive heart failure, alcohol abuse, hyeprtension, diabetes mellitus type II, history of osteomyelitis MRSA, hyperlipidemia, depression, peripheral neuropathy who presents with acute renal failure on chronic kidney disease and severe hyponatremia.   1. Hyponatremia: severe.  Alcohol related vs  thiazide diuretic Improved with iv NS and normal diet  2. Acute renal failure on chronic kidney disease stage III: seems to be secondary to prerenal azotemia. Creatinine improving. Nonoliguric urine output. Rhabdomyolysis also present.  CKD is secondary to alcohol abuse, diabetes and hypertension.  Follow up as outpatient   LOS: 4 Maurice Jordan 5/25/201610:24 AM

## 2015-03-15 NOTE — Care Management Note (Signed)
Case Management Note  Patient Details  Name: Maurice Jordan MRN: [id number] Date of Birth: 1956-10-15  Subjective/Objective:   Discharging today with resumption of care with [facility] for nursing and SW.                  Action/Plan:   Expected Discharge Date:   03/15/2015               Expected Discharge Plan:  Home w Home Health Services  In-House Referral:     Discharge planning Services  CM Consult  Post Acute Care Choice:    Choice offered to:  Patient  DME Arranged:    DME Agency:     HH Arranged:  RN HH Agency:  [facility]  Status of Service:  Completed, signed off  Medicare Important Message Given:    Date Medicare IM Given:    Medicare IM give by:    Date Additional Medicare IM Given:    Additional Medicare Important Message give by:     If discussed at Long Length of Stay Meetings, dates discussed:    Additional Comments:  [name], RN 03/15/2015, 10:28 AM

## 2015-03-18 NOTE — Discharge Summary (Signed)
[facility] - [place] at [facility]  DISCHARGE SUMMARY   PATIENT NAME: Wlliam Jordan    MR#:  [id number]  DATE OF BIRTH:  [birth year]  DATE OF ADMISSION:  03/11/2015 ADMITTING PHYSICIAN: Gale Journey, MD  DATE OF DISCHARGE: 03/15/2015  5:14 PM  PRIMARY CARE PHYSICIAN: 59 primary care provider on file.    ADMISSION DIAGNOSIS:  Alcohol abuse [F10.10] Hyponatremia [E87.1] Cocaine abuse [F14.10] Non-traumatic rhabdomyolysis [M62.82] Acute renal failure, unspecified acute renal failure type [N17.9]  DISCHARGE DIAGNOSIS:  Principal Problem:   Altered mental status Active Problems:   Hyponatremia   Chronic systolic congestive heart failure   Polysubstance abuse   Alcohol abuse   Hypertension   Diabetes mellitus   Rhabdomyolysis   Acute renal failure superimposed on stage 3 chronic kidney disease   SECONDARY DIAGNOSIS:   Past Medical History  Diagnosis Date  . Diabetes   . Congestive heart failure   . Hypertension   . Neuropathy   . Hyperlipidemia   . Depression   . Osteomyelitis of toe     3rd toe  . Hx MRSA infection 04/15/2014  . Alcohol abuse     HOSPITAL COURSE:    Problem #1 metabolic encephalopathy: Multifactorial due to polysubstance abuse, alcohol intoxication and hyponatremia. Could possibly have seized with low NA and become post ictal. Was fairly lethargic for the first few days of his admission. CT of the head shows mild cerebral atrophy no acute abnormalities. With improvement of electrolytes he is much more energetic alert and coherent. He has been speaking to his sister on the phone who feels that he is back to his baseline.  Problem #2 acute on chronic hyponatremia: Due to beer potomania 3 x 40oz daily, CHF EF 35%, chlorthalidone and SSRI. Improved slowly with fluid restriction, normal saline and Lasix. Appreciate nephrology consultation. Discussed this issue with the patient every day during the admission. He understands  that he needs to restrict fluids, maintain compliance with medications and avoid alcohol. Will need close follow-up with a primary care physician, A list has been provided. Currently seeing a [facility] physician.  Problem #3 chronic systolic congestive heart failure: Ejection fraction 35%. No acute exacerbation at this time. Continue Coreg and lisinopril, Lasix. Referred to CHF clinic. Discussed fluid restriction, low sodium diet.  Problem #4 hypertension: fair control.  Chlorthalidone discontinued due to hyponatremia. Continue Coreg lisinopril and Lasix  Problem #5 alcohol and polysubstance abuse: Discussed daily with the patient and with his sister. He was on see while monitoring protocol throughout the hospitalization and did not need any Ativan for withdrawal. Was seen by social work for polysubstance abuse and support. The patient reports motivation to stop drinking and using illicit substances. Unfortunately his sister reports that he has tried on many occasions and has not succeeded in the past at quitting. She is not very optimistic.  #6 rhabdomyolysis: due to AMS syncope fall. Improved during hospitalization with fluids.  #7 acute renal failure on chronic kidney disease stage III: Baseline creatinine is about 1.5. Cr on admission 2.33>> 1.93--1.74--1.8. Improved with hydration.  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 Continue Lantus 10 units daily and sliding scale insulin   DISCHARGE CONDITIONS:   Fair  CONSULTS OBTAINED:     DRUG ALLERGIES:  No Known Allergies  DISCHARGE MEDICATIONS:   Discharge Medication List as of 03/15/2015  1:58 PM    START taking these medications   Details  insulin glargine (  LANTUS) 100 UNIT/ML injection Inject 0.1 mLs (10 Units total) into the skin at bedtime., Starting 03/15/2015, Until Discontinued, Print      CONTINUE these medications which have CHANGED   Details  lisinopril (PRINIVIL,ZESTRIL) 20  MG tablet Take 1 tablet (20 mg total) by mouth daily., Starting 03/15/2015, Until Discontinued, Print      CONTINUE these medications which have NOT CHANGED   Details  aspirin EC 81 MG tablet Take 81 mg by mouth daily., Until Discontinued, Historical Med    atorvastatin (LIPITOR) 40 MG tablet Take 80 mg by mouth every evening., Until Discontinued, Historical Med    carvedilol (COREG) 6.25 MG tablet Take 6.25 mg by mouth 2 (two) times daily., Until Discontinued, Historical Med    furosemide (LASIX) 20 MG tablet Take 20 mg by mouth 2 (two) times daily., Until Discontinued, Historical Med    gabapentin (NEURONTIN) 300 MG capsule Take 900 mg by mouth at bedtime., Until Discontinued, Historical Med    potassium chloride (K-DUR) 10 MEQ tablet Take 10 mEq by mouth at bedtime., Until Discontinued, Historical Med    tamsulosin (FLOMAX) 0.4 MG CAPS capsule Take 0.4 mg by mouth daily., Until Discontinued, Historical Med      STOP taking these medications     amLODipine (NORVASC) 10 MG tablet      chlorthalidone (HYGROTON) 25 MG tablet      citalopram (CELEXA) 20 MG tablet          DISCHARGE INSTRUCTIONS:    See AVS  If you experience worsening of your admission symptoms, develop shortness of breath, life threatening emergency, suicidal or homicidal thoughts you must seek medical attention immediately by calling 911 or calling your MD immediately  if symptoms less severe.  You Must read complete instructions/literature along with all the possible adverse reactions/side effects for all the Medicines you take and that have been prescribed to you. Take any new Medicines after you have completely understood and accept all the possible adverse reactions/side effects.   Please note  You were cared for by a hospitalist during your hospital stay. If you have any questions about your discharge medications or the care you received while you were in the hospital after you are discharged, you can  call the unit and asked to speak with the hospitalist on call if the hospitalist that took care of you is not available. Once you are discharged, your primary care physician will handle any further medical issues. Please note that NO REFILLS for any discharge medications will be authorized once you are discharged, as it is imperative that you return to your primary care physician (or establish a relationship with a primary care physician if you do not have one) for your aftercare needs so that they can reassess your need for medications and monitor your lab values.    Today   CHIEF COMPLAINT:   Chief Complaint  Patient presents with  . Altered Mental Status    HISTORY OF PRESENT ILLNESS:  Maurice Jordan is a 59 y.o. male with a known history of alcohol abuse, chronic systolic heart failure with ejection fraction of 35%, chronic hyponatremia, hypertension, diabetes mellitus who presents via EMS today after being found on the floor of his home by his friends. The patient is unable to provide any history he does not know how or why he is in the emergency department. Orting to the EMS report he was drinking alcohol with friends and felt that his blood sugar was dropping, he went into  the house and then was found unconscious on the floor. Of note he had become incontinent of bowel. On presentation to the emergency department he continues to be confused, his serum sodium is significantly lower than prior measurements at 113. UDS positive for cocaine serum ethanol level is positive.   VITAL SIGNS:  Blood pressure 151/98, pulse 81, temperature 98 F (36.7 C), temperature source Oral, resp. rate 18, height 5\' 6"  (1.676 m), weight 75.751 kg (167 lb), SpO2 100 %.  I/O:  No intake or output data in the 24 hours ending 03/18/15 1526  PHYSICAL EXAMINATION:  GENERAL: 59 y.o.-year-old patient lying in the bed with no acute distress.  EYES: Pupils equal, round, reactive to light and accommodation. No  scleral icterus. Extraocular muscles intact.  HEENT: Head atraumatic, normocephalic. Oropharynx and nasopharynx clear. Edentulous, oral mucous membranes pink and moist NECK: Supple, no jugular venous distention. No thyroid enlargement, no tenderness.  LUNGS: Normal breath sounds bilaterally, no wheezing, rales,rhonchi or crepitation. No use of accessory muscles of respiration.  CARDIOVASCULAR: S1, S2 normal. No murmurs, rubs, or gallops.  ABDOMEN: Soft, nontender, slightly distended. Bowel sounds present. No organomegaly or mass. No rebound or guarding EXTREMITIES: No pedal edema, cyanosis, or clubbing.  NEUROLOGIC: Cranial nerves II through XII are intact. Muscle strength 5/5 in all extremities. Sensation intact. Gait not checked. PSYCHIATRIC: The patient is alert and oriented x3, calm SKIN: No obvious rash, lesion, or ulcer.   DATA REVIEW:   CBC  Recent Labs Lab 03/12/15 0418  WBC 5.2  HGB 10.9*  HCT 30.9*  PLT 194    Chemistries   Recent Labs Lab 03/11/15 1642  03/13/15 0402  03/15/15 0445  NA 113*  < > 125*  < > 129*  K 3.3*  < > 4.2  < > 4.2  CL 77*  < > 93*  < > 94*  CO2 23  < > 27  < > 28  GLUCOSE 176*  < > 146*  < > 158*  BUN 41*  < > 31*  < > 40*  CREATININE 2.33*  < > 1.74*  < > 1.94*  CALCIUM 8.9  < > 9.0  < > 9.5  MG  --   --  1.4*  --   --   AST 63*  --   --   --   --   ALT 42  --   --   --   --   ALKPHOS 51  --   --   --   --   BILITOT 0.4  --   --   --   --   < > = values in this interval not displayed.  Cardiac Enzymes  Recent Labs Lab 03/11/15 1642  TROPONINI <0.03    Microbiology Results  Results for orders placed or performed in visit on 04/11/14  Wound culture     Status: None   Collection Time: 04/14/14  2:08 PM  Result Value Ref Range Status   Micro Text Report   Final       SOURCE: LEFT 3RD & 4TH TOE    ORGANISM 1                MODERATE GROWTH METHICILLIN RESISTANT STAPH.AUREUS   ORGANISM 2                LIGHT GROWTH  ENTEROCOCCUS FAECALIS   ORGANISM 3  MODERATE GROWTH KLEBSIELLA SPECIES   COMMENT                   -   COMMENT                   WITH NORMAL SKIN FLORA   COMMENT                   NO ANAEROBES ISOLATED IN 4 DAYS   GRAM STAIN                MODERATE WHITE BLOOD CELLS   GRAM STAIN                MANY GRAM POSITIVE COCCI IN PAIRS IN CLUSTERS   GRAM STAIN                FEW GRAM POSITIVE ROD RARE GRAM NEGATIVE ROD   ANTIBIOTIC                    ORG#1    ORG#2    ORG#3    ORG#4     CIPROFLOXACIN                 S                 S        S         CLINDAMYCIN                   S                                    ERYTHROMYCIN                  R                                    GENTAMICIN                    S                 S        S         LEVOFLOXACIN                  S                 S        S          LINEZOLID                     S        S                           OXACILLIN                     R                                    TIGECYCLINE                   S  VANCOMYCIN                    S                                    CEFOXITIN SCREEN              POSITIVE                             INDUCIBLE CLINDAMYCIN RESISTANNEGATIVE                             TRIMETHOPRIM/SULFAMETHOXAZOLE S                 S        S         AMPICILLIN                             S        R        R         CEFAZOLIN                                       S        S         CEFOXITIN                                       S        S         CEFTAZIDIME                                     S        S         CEFTRIAXONE                                     S        S         IMIPENEM                                        S        S           Wound culture     Status: None   Collection Time: 04/15/14 12:19 PM  Result Value Ref Range Status   Micro Text Report   Final       SOURCE: LEFT THIRD TOE    ORGANISM 1                HEAVY GROWTH  METHICILLIN RESISTANT STAPH.AUREUS   ORGANISM 2                LIGHT GROWTH ENTEROBACTER CLOACAE SSP CLOACAE   ORGANISM 3  MODERATE GROWTH ENTEROCOCCUS FAECALIS   ORGANISM 4                HEAVY GROWTH FINEGOLDIA MAGNA   COMMENT                   -   COMMENT                   WITH NORMAL SKIN FLORA   GRAM STAIN                MODERATE WHITE BLOOD CELLS   GRAM STAIN                FEW GRAM POSITIVE COCCI IN PAIRS IN CLUSTERS   GRAM STAIN                MODERATE GRAM NEGATIVE ROD   ANTIBIOTIC                    ORG#1    ORG#2    ORG#3    ORG#4     CIPROFLOXACIN                 S        S                           CLINDAMYCIN                   S                                    ERYTHROMYCIN                  R                                    GENTAMICIN                    S        S                           LEVOFLOXACIN                  S        S                           LINEZOLID                      S                 S                  OXACILLIN                     R                                    TIGECYCLINE                   S  VANCOMYCIN                    S                                    CEFOXITIN SCREEN              POSITIVE                             INDUCIBLE CLINDAMYCIN RESISTANNEGATIVE                             TRIMETHOPRIM/SULFAMETHOXAZOLE S        S                           CEFAZOLIN                              R                           CEFOXITIN                              R                           CEFTAZIDIME                            S                           CEFTRIAXONE                            S                           IMIPENEM                               S                           AMPICILLIN                                      S                  BETA-LACTAMASE                                           NEGATIVE      RADIOLOGY:  No results found.  EKG:   Orders placed or performed  during the hospital encounter of 03/11/15  . EKG 12-Lead  . EKG 12-Lead  . EKG      Management plans discussed with the patient, family and they are  in agreement.  CODE STATUS: Full  TOTAL TIME TAKING CARE OF THIS PATIENT: 40 minutes.    Elby Showers M.D on 03/18/2015 at 3:26 PM  Between 7am to 6pm - Pager - [phone]  After 6pm go to [email or web address] - password [email or web address] [facility]  [place]  [facility]  Office  [phone]  CC: Primary care physician; No primary care provider on file.

## 2015-03-30 ENCOUNTER — Ambulatory Visit: Payer: Medicaid Other | Admitting: Family

## 2015-04-20 ENCOUNTER — Ambulatory Visit: Payer: Medicaid Other | Admitting: Family

## 2015-06-01 ENCOUNTER — Telehealth: Payer: Self-pay | Admitting: Family

## 2015-06-01 ENCOUNTER — Ambulatory Visit: Payer: Medicaid Other | Admitting: Family

## 2015-06-01 NOTE — Telephone Encounter (Signed)
Patient did not show for his initial visit at the Heart Failure Clinic on 06/01/15. Will attempt to reschedule.

## 2015-06-09 ENCOUNTER — Ambulatory Visit: Payer: Medicaid Other | Admitting: Family

## 2015-06-09 ENCOUNTER — Telehealth: Payer: Self-pay | Admitting: Family

## 2015-06-09 NOTE — Telephone Encounter (Signed)
Patient no showed for his initial appointment on 06/09/15 at the outpatient [facility] Failure Clinic. This was his 2nd appointment that he did not show up for. Will attempt to reschedule.

## 2016-05-08 ENCOUNTER — Encounter: Payer: Self-pay | Admitting: *Deleted

## 2016-05-08 ENCOUNTER — Emergency Department
Admission: EM | Admit: 2016-05-08 | Discharge: 2016-05-08 | Disposition: A | Discharge status: Home / Self Care | Payer: Medicaid Other | Attending: Student | Admitting: Student

## 2016-05-08 DIAGNOSIS — I110 Hypertensive heart disease with heart failure: Secondary | ICD-10-CM | POA: Diagnosis not present

## 2016-05-08 DIAGNOSIS — E785 Hyperlipidemia, unspecified: Secondary | ICD-10-CM | POA: Insufficient documentation

## 2016-05-08 DIAGNOSIS — E1165 Type 2 diabetes mellitus with hyperglycemia: Secondary | ICD-10-CM | POA: Insufficient documentation

## 2016-05-08 DIAGNOSIS — Z79899 Other long term (current) drug therapy: Secondary | ICD-10-CM | POA: Insufficient documentation

## 2016-05-08 DIAGNOSIS — Z794 Long term (current) use of insulin: Secondary | ICD-10-CM | POA: Diagnosis not present

## 2016-05-08 DIAGNOSIS — Z7982 Long term (current) use of aspirin: Secondary | ICD-10-CM | POA: Diagnosis not present

## 2016-05-08 DIAGNOSIS — F329 Major depressive disorder, single episode, unspecified: Secondary | ICD-10-CM | POA: Diagnosis not present

## 2016-05-08 DIAGNOSIS — I509 Heart failure, unspecified: Secondary | ICD-10-CM | POA: Insufficient documentation

## 2016-05-08 LAB — GLUCOSE, CAPILLARY
GLUCOSE-CAPILLARY: 254 mg/dL — AB (ref 65–99)
Glucose-Capillary: 226 mg/dL — ABNORMAL HIGH (ref 65–99)

## 2016-05-08 LAB — CBC
HCT: 35.4 % — ABNORMAL LOW (ref 40.0–52.0)
Hemoglobin: 12.2 g/dL — ABNORMAL LOW (ref 13.0–18.0)
MCH: 31.4 pg (ref 26.0–34.0)
MCHC: 34.5 g/dL (ref 32.0–36.0)
MCV: 90.9 fL (ref 80.0–100.0)
Platelets: 198 10*3/uL (ref 150–440)
RBC: 3.9 MIL/uL — ABNORMAL LOW (ref 4.40–5.90)
RDW: 12.9 % (ref 11.5–14.5)
WBC: 3.9 10*3/uL (ref 3.8–10.6)

## 2016-05-08 LAB — BASIC METABOLIC PANEL
Anion gap: 8 (ref 5–15)
BUN: 33 mg/dL — AB (ref 6–20)
CALCIUM: 9.2 mg/dL (ref 8.9–10.3)
CO2: 27 mmol/L (ref 22–32)
CREATININE: 2.44 mg/dL — AB (ref 0.61–1.24)
Chloride: 100 mmol/L — ABNORMAL LOW (ref 101–111)
GFR calc Af Amer: 31 mL/min — ABNORMAL LOW (ref >60)
GFR, EST NON AFRICAN AMERICAN: 27 mL/min — AB (ref >60)
GLUCOSE: 307 mg/dL — AB (ref 65–99)
Potassium: 3.8 mmol/L (ref 3.5–5.1)
Sodium: 135 mmol/L (ref 135–145)

## 2016-05-08 MED ORDER — SODIUM CHLORIDE 0.9 % IV BOLUS (SEPSIS)
1000.0000 mL | Freq: Once | INTRAVENOUS | Status: AC
  Administered 2016-05-08: 1000 mL via INTRAVENOUS

## 2016-05-08 NOTE — ED Notes (Signed)
Pt c/o high blood gluocse since this am. Pt states at highest is was up to 600 and pt took 20U of insulin and got down to 300s. Pt denies any symptoms. Pt A&O some drowsiness

## 2016-05-08 NOTE — Discharge Instructions (Signed)
We were able to get your blood sugar down tonight. Continue to monitor and treat blood sugars with insulin as directed. Avoid high sugar and carbohydrate foods. Follow-up with Dr. Clent Jordan as directed. Return to the ED as needed  Blood Glucose Monitoring, Adult Monitoring your blood glucose (also know as blood sugar) helps you to manage your diabetes. It also helps you and your health care provider monitor your diabetes and determine how well your treatment plan is working. WHY SHOULD YOU MONITOR YOUR BLOOD GLUCOSE?  It can help you understand how food, exercise, and medicine affect your blood glucose.  It allows you to know what your blood glucose is at any given moment. You can quickly tell if you are having low blood glucose (hypoglycemia) or high blood glucose (hyperglycemia).  It can help you and your health care provider know how to adjust your medicines.  It can help you understand how to manage an illness or adjust medicine for exercise. WHEN SHOULD YOU TEST? Your health care provider will help you decide how often you should check your blood glucose. This may depend on the type of diabetes you have, your diabetes control, or the types of medicines you are taking. Be sure to write down all of your blood glucose readings so that this information can be reviewed with your health care provider. See below for examples of testing times that your health care provider may suggest. Type 1 Diabetes  Test at least 2 times per day if your diabetes is well controlled, if you are using an insulin pump, or if you perform multiple daily injections.  If your diabetes is not well controlled or if you are sick, you may need to test more often.  It is a good idea to also test:  Before every insulin injection.  Before and after exercise.  Between meals and 2 hours after a meal.  Occasionally between 2:00 a.m. and 3:00 a.m. Type 2 Diabetes  If you are taking insulin, test at least 2 times per day.  However, it is best to test before every insulin injection.  If you take medicines by mouth (orally), test 2 times a day.  If you are on a controlled diet, test once a day.  If your diabetes is not well controlled or if you are sick, you may need to monitor more often. HOW TO MONITOR YOUR BLOOD GLUCOSE Supplies Needed  Blood glucose meter.  Test strips for your meter. Each meter has its own strips. You must use the strips that go with your own meter.  A pricking needle (lancet).  A device that holds the lancet (lancing device).  A journal or log book to write down your results. Procedure  Wash your hands with soap and water. Alcohol is not preferred.  Prick the side of your finger (not the tip) with the lancet.  Gently milk the finger until a small drop of blood appears.  Follow the instructions that come with your meter for inserting the test strip, applying blood to the strip, and using your blood glucose meter. Other Areas to Get Blood for Testing Some meters allow you to use other areas of your body (other than your finger) to test your blood. These areas are called alternative sites. The most common alternative sites are:  The forearm.  The thigh.  The back area of the lower leg.  The palm of the hand. The blood flow in these areas is slower. Therefore, the blood glucose values you get may  be delayed, and the numbers are different from what you would get from your fingers. Do not use alternative sites if you think you are having hypoglycemia. Your reading will not be accurate. Always use a finger if you are having hypoglycemia. Also, if you cannot feel your lows (hypoglycemia unawareness), always use your fingers for your blood glucose checks. ADDITIONAL TIPS FOR GLUCOSE MONITORING  Do not reuse lancets.  Always carry your supplies with you.  All blood glucose meters have a 24-hour "hotline" number to call if you have questions or need help.  Adjust (calibrate)  your blood glucose meter with a control solution after finishing a few boxes of strips. BLOOD GLUCOSE RECORD KEEPING It is a good idea to keep a daily record or log of your blood glucose readings. Most glucose meters, if not all, keep your glucose records stored in the meter. Some meters come with the ability to download your records to your home computer. Keeping a record of your blood glucose readings is especially helpful if you are wanting to look for patterns. Make notes to go along with the blood glucose readings because you might forget what happened at that exact time. Keeping good records helps you and your health care provider to work together to achieve good diabetes management.    This information is not intended to replace advice given to you by your health care provider. Make sure you discuss any questions you have with your health care provider.   Document Released: 10/10/2003 Document Revised: 10/28/2014 Document Reviewed: 03/01/2013 Elsevier Interactive Patient Education 2016 [employer].   How to Avoid Diabetes Problems You can do a lot to prevent or slow down diabetes problems. Following your diabetes plan and taking care of yourself can reduce your risk of serious or life-threatening complications. Below, you will find certain things you can do to prevent diabetes problems. MANAGE YOUR DIABETES Follow your health care provider's, nurse educator's, and dietitian's instructions for managing your diabetes. They will teach you the basics of diabetes care. They can help answer questions you may have. Learn about diabetes and make healthy choices regarding eating and physical activity. Monitor your blood glucose level regularly. Your health care provider will help you decide how often to check your blood glucose level depending on your treatment goals and how well you are meeting them.  DO NOT USE NICOTINE Nicotine and diabetes are a dangerous combination. Nicotine raises your risk for  diabetes problems. If you quit using nicotine, you will lower your risk for heart attack, stroke, nerve disease, and kidney disease. Your cholesterol and your blood pressure levels may improve. Your blood circulation will also improve. Do not use any tobacco products, including cigarettes, chewing tobacco, or electronic cigarettes. If you need help quitting, ask your health care provider. KEEP YOUR BLOOD PRESSURE UNDER CONTROL Your health care provider will determine your individualized target blood pressure based on your age, your medicines, how long you have had diabetes, and any other medical conditions you have. Blood pressure consists of two numbers. Generally, the goal is to keep your top number (systolic pressure) at or below 130, and your bottom number (diastolic pressure) at or below 80. Your health care provider may recommend a lower target blood pressure reading, if appropriate. Meal planning, medicines, and exercise can help you reach your target blood pressure. Make sure your health care provider checks your blood pressure at every visit. KEEP YOUR CHOLESTEROL UNDER CONTROL Normal cholesterol levels will help prevent heart disease and stroke.  These are the biggest health problems for people with diabetes. Keeping cholesterol levels under control can also help with blood flow. Have your cholesterol level checked at least once a year. Your health care provider may prescribe a medicine known as a statin. Statins lower your cholesterol. If you are not taking a statin, ask your health care provider if you should be. Meal planning, exercise, and medicines can help you reach your cholesterol targets.  SCHEDULE AND KEEP YOUR ANNUAL PHYSICAL EXAMS AND EYE EXAMS Your health care provider will tell you how often he or she wants to see you depending on your plan of treatment. It is important that you keep these appointments so that possible problems can be identified early and complications can be avoided or  treated.  Every visit with your health care provider should include your weight, blood pressure, and an evaluation of your blood glucose control.  Your hemoglobin A1c should be checked:  At least twice a year if you are at your goal.  Every 3 months if there are changes in treatment.  If you are not meeting your goals.  Your blood lipids should be checked yearly. You should also be checked yearly to see if you have protein in your urine (microalbumin).  Schedule a dilated eye exam within 5 years of your diagnosis if you have type 1 diabetes, and then yearly. Schedule a dilated eye exam at diagnosis if you have type 2 diabetes, and then yearly. All exams thereafter can be extended to every 2 to 3 years if one or more exams have been normal. KEEP YOUR VACCINES CURRENT It is recommended that you receive a flu (influenza) vaccine every year. It is also recommended that you receive a pneumonia (pneumococcal) vaccine. If you are 19 years of age or older and have never received a pneumonia vaccine, this vaccine may be given as a series of two separate shots. Ask your health care provider which additional vaccines may be recommended. TAKE CARE OF YOUR FEET  Diabetes may cause you to have a poor blood supply (circulation) to your legs and feet. Because of this, the skin may be thinner, break easier, and heal more slowly. You also may have nerve damage in your legs and feet, causing decreased feeling. You may not notice minor injuries to your feet that could lead to serious problems or infections. Taking care of your feet is very important. Visual foot exams are performed at every routine medical visit. The exams check for cuts, injuries, or other problems with the feet. A comprehensive foot exam should be done yearly. This includes visual inspection as well as assessing foot pulses and testing for loss of sensation. You should also do the following:  Inspect your feet daily for cuts, calluses, blisters,  ingrown toenails, and signs of infection, such as redness, swelling, or pus.  Wash and dry your feet thoroughly, especially between the toes.  Avoid soaking your feet regularly in hot water baths.  Moisturize dry skin with lotion, avoiding areas between your toes.  Cut toenails straight across and file the edges.  Avoid shoes that do not fit well or have areas that irritate your skin.  Avoid going barefooted or wearing only socks. Your feet need protection. TAKE CARE OF YOUR TEETH People with poorly controlled diabetes are more likely to have gum (periodontal) disease. These infections make diabetes harder to control. Periodontal diseases, if left untreated, can lead to tooth loss. Brush your teeth twice a day, floss, and see your  dentist for checkups and cleaning every 6 months, or 2 times a year. ASK YOUR HEALTH CARE PROVIDER ABOUT TAKING ASPIRIN Taking aspirin daily is recommended to help prevent cardiovascular disease in people with and without diabetes. Ask your health care provider if this would benefit you and what dose he or she would recommend. DRINK RESPONSIBLY Moderate amounts of alcohol (less than 1 drink per day for adult women and less than 2 drinks per day for adult men) have a minimal effect on blood glucose if ingested with food. It is important to eat food with alcohol to avoid hypoglycemia. People should avoid alcohol if they have a history of alcohol abuse or dependence, if they are pregnant, and if they have liver disease, pancreatitis, advanced neuropathy, or severe hypertriglyceridemia. LESSEN STRESS Living with diabetes can be stressful. When you are under stress, your blood glucose may be affected in two ways:  Stress hormones may cause your blood glucose to rise.  You may be distracted from taking good care of yourself. It is a good idea to be aware of your stress level and make changes that are necessary to help you better manage challenging situations. Support  groups, planned relaxation, a hobby you enjoy, meditation, healthy relationships, and exercise all work to lower your stress level. If your efforts do not seem to be helping, get help from your health care provider or a trained mental health professional.   This information is not intended to replace advice given to you by your health care provider. Make sure you discuss any questions you have with your health care provider.   Document Released: 06/25/2011 Document Revised: 10/28/2014 Document Reviewed: 12/01/2013 Elsevier Interactive Patient Education [employer]

## 2016-05-08 NOTE — ED Notes (Signed)
Pt here via [facility] with hyperglycemia; pt reports CBG was over 600 this morning, he took 20u of insulin and EMS reports a CBG of 327.

## 2016-05-08 NOTE — ED Provider Notes (Signed)
[facility] Emergency Department Provider Note ____________________________________________  Time seen: 1825  I have reviewed the triage vital signs and the nursing notes.  HISTORY  Chief Complaint  Hyperglycemia  HPI Maurice Jordan is a 60 y.o. male presents to the ED via EMS forEvaluation of hyperglycemia. She reports that he did not dose his usual 15 units of insulin last night. When he awoke this morning he noted a blood sugar of 600 after he had eaten pancakes for breakfast. His roommate subsequently called EMS after the patient returned from a walk. She describes the patient as being incoherent in his speech. EMS reported a CBG of 327.This was after the patient took 20 units of insulin prior to their arrival. Denies any recent injury, illness, fever, chills, or sweats.  Past Medical History  Diagnosis Date  . Diabetes (HCC)   . Congestive heart failure (HCC)   . Hypertension   . Neuropathy (HCC)   . Hyperlipidemia   . Depression   . Osteomyelitis of toe (HCC)     3rd toe  . Hx MRSA infection 04/15/2014  . Alcohol abuse     Patient Active Problem List   Diagnosis Date Noted  . Hyponatremia 03/11/2015  . Chronic systolic congestive heart failure (HCC) 03/11/2015  . Polysubstance abuse 03/11/2015  . Alcohol abuse 03/11/2015  . Hypertension 03/11/2015  . Diabetes mellitus (HCC) 03/11/2015  . Altered mental status 03/11/2015  . Rhabdomyolysis 03/11/2015  . Acute renal failure superimposed on stage 3 chronic kidney disease (HCC) 03/11/2015    History reviewed. No pertinent past surgical history.  Current Outpatient Rx  Name  Route  Sig  Dispense  Refill  . aspirin EC 81 MG tablet   Oral   Take 81 mg by mouth daily.         Marland Kitchen atorvastatin (LIPITOR) 40 MG tablet   Oral   Take 80 mg by mouth every evening.         . carvedilol (COREG) 6.25 MG tablet   Oral   Take 6.25 mg by mouth 2 (two) times daily.         . furosemide (LASIX) 20  MG tablet   Oral   Take 20 mg by mouth 2 (two) times daily.         Marland Kitchen gabapentin (NEURONTIN) 300 MG capsule   Oral   Take 900 mg by mouth at bedtime.         . insulin glargine (LANTUS) 100 UNIT/ML injection   Subcutaneous   Inject 0.1 mLs (10 Units total) into the skin at bedtime.   10 mL   11   . lisinopril (PRINIVIL,ZESTRIL) 20 MG tablet   Oral   Take 1 tablet (20 mg total) by mouth daily.   30 tablet   6   . potassium chloride (K-DUR) 10 MEQ tablet   Oral   Take 10 mEq by mouth at bedtime.         . tamsulosin (FLOMAX) 0.4 MG CAPS capsule   Oral   Take 0.4 mg by mouth daily.          Allergies Review of patient's allergies indicates no known allergies.  History reviewed. No pertinent family history.  Social History Social History  Substance Use Topics  . Smoking status: Never Smoker   . Smokeless tobacco: None  . Alcohol Use: Yes     Comment: 2-3 40's a day   Review of Systems  Constitutional: Negative for fever. Eyes: Negative for  visual changes. Cardiovascular: Negative for chest pain. Respiratory: Negative for shortness of breath. Gastrointestinal: Negative for abdominal pain, vomiting and diarrhea. Genitourinary: Negative for dysuria Neurological: Negative for headaches, focal weakness or numbness. ____________________________________________  PHYSICAL EXAM:  VITAL SIGNS: ED Triage Vitals  Enc Vitals Group     BP 05/08/16 1656 95/65 mmHg     Pulse Rate 05/08/16 1656 75     Resp 05/08/16 1656 18     Temp 05/08/16 1656 98.2 F (36.8 C)     Temp Source 05/08/16 1656 Oral     SpO2 05/08/16 1656 100 %     Weight 05/08/16 1656 152 lb (68.947 kg)     Height 05/08/16 1656 5\' 6"  (1.676 m)     Head Cir --      Peak Flow --      Pain Score 05/08/16 1809 0     Pain Loc --      Pain Edu? --      Excl. in GC? --    Constitutional: Alert and oriented. Well appearing and in no distress. Head: Normocephalic and atraumatic.      Eyes:  Conjunctivae are normal. PERRL. Normal extraocular movements      Ears: Canals clear. TMs intact bilaterally.   Nose: No congestion/rhinorrhea.   Mouth/Throat: Mucous membranes are moist.   Neck: Supple. No thyromegaly. Hematological/Lymphatic/Immunological: No cervical lymphadenopathy. Cardiovascular: Normal rate, regular rhythm.  Respiratory: Normal respiratory effort. No wheezes/rales/rhonchi. Gastrointestinal: Soft and nontender. No distention. Musculoskeletal: Nontender with normal range of motion in all extremities.  Neurologic:  Normal gait without ataxia. Normal speech and language. No gross focal neurologic deficits are appreciated. Skin:  Skin is warm, dry and intact. No rash noted. Psychiatric: Mood and affect are normal. Patient exhibits appropriate insight and judgment. ____________________________________________    LABS (pertinent positives/negatives) Labs Reviewed  BASIC METABOLIC PANEL - Abnormal; Notable for the following:    Chloride 100 (*)    Glucose, Bld 307 (*)    BUN 33 (*)    Creatinine, Ser 2.44 (*)    GFR calc non Af Amer 27 (*)    GFR calc Af Amer 31 (*)    All other components within normal limits  CBC - Abnormal; Notable for the following:    RBC 3.90 (*)    Hemoglobin 12.2 (*)    HCT 35.4 (*)    All other components within normal limits  GLUCOSE, CAPILLARY - Abnormal; Notable for the following:    Glucose-Capillary 254 (*)    All other components within normal limits  GLUCOSE, CAPILLARY - Abnormal; Notable for the following:    Glucose-Capillary 226 (*)    All other components within normal limits  URINALYSIS COMPLETEWITH MICROSCOPIC ([facility] ONLY)  CBG MONITORING, ED  ____________________________________________  PROCEDURES  NS 1000 ml IV ____________________________________________  INITIAL IMPRESSION / ASSESSMENT AND PLAN / ED COURSE  Sent with hyperglycemia secondary to type 2 diabetes with poor insulin compliance. She is  discharged with reassuring labs and reduction in blood glucose. He is encouraged to follow-up with his primary care provider and to continue to dose insulin as prescribed. He to the ED as needed for worsening symptoms. ____________________________________________  FINAL CLINICAL IMPRESSION(S) / ED DIAGNOSES  Final diagnoses:  Type 2 diabetes mellitus with hyperglycemia, unspecified long term insulin use status (HCC)     Lissa Hoard, PA-C 05/08/16 4327   Gayla Doss, MD 05/16/16 [phone]

## 2016-05-08 NOTE — ED Notes (Signed)
States he did not take his night time insulin dose last night, denies any pain at present

## 2017-05-18 ENCOUNTER — Observation Stay
Admission: EM | Admit: 2017-05-18 | Discharge: 2017-05-20 | Disposition: A | Discharge status: Home / Self Care | Payer: Medicaid Other | Attending: Internal Medicine | Admitting: Internal Medicine

## 2017-05-18 ENCOUNTER — Encounter: Payer: Self-pay | Admitting: Emergency Medicine

## 2017-05-18 ENCOUNTER — Emergency Department: Payer: Medicaid Other

## 2017-05-18 DIAGNOSIS — E785 Hyperlipidemia, unspecified: Secondary | ICD-10-CM | POA: Diagnosis not present

## 2017-05-18 DIAGNOSIS — M6282 Rhabdomyolysis: Secondary | ICD-10-CM | POA: Insufficient documentation

## 2017-05-18 DIAGNOSIS — I130 Hypertensive heart and chronic kidney disease with heart failure and stage 1 through stage 4 chronic kidney disease, or unspecified chronic kidney disease: Secondary | ICD-10-CM | POA: Diagnosis not present

## 2017-05-18 DIAGNOSIS — F1410 Cocaine abuse, uncomplicated: Secondary | ICD-10-CM | POA: Insufficient documentation

## 2017-05-18 DIAGNOSIS — R748 Abnormal levels of other serum enzymes: Secondary | ICD-10-CM | POA: Diagnosis present

## 2017-05-18 DIAGNOSIS — Z7982 Long term (current) use of aspirin: Secondary | ICD-10-CM | POA: Diagnosis not present

## 2017-05-18 DIAGNOSIS — I428 Other cardiomyopathies: Secondary | ICD-10-CM | POA: Diagnosis not present

## 2017-05-18 DIAGNOSIS — R7989 Other specified abnormal findings of blood chemistry: Secondary | ICD-10-CM

## 2017-05-18 DIAGNOSIS — E871 Hypo-osmolality and hyponatremia: Secondary | ICD-10-CM | POA: Diagnosis not present

## 2017-05-18 DIAGNOSIS — R55 Syncope and collapse: Secondary | ICD-10-CM | POA: Diagnosis present

## 2017-05-18 DIAGNOSIS — N179 Acute kidney failure, unspecified: Secondary | ICD-10-CM | POA: Insufficient documentation

## 2017-05-18 DIAGNOSIS — E1122 Type 2 diabetes mellitus with diabetic chronic kidney disease: Secondary | ICD-10-CM | POA: Diagnosis not present

## 2017-05-18 DIAGNOSIS — Z89429 Acquired absence of other toe(s), unspecified side: Secondary | ICD-10-CM | POA: Insufficient documentation

## 2017-05-18 DIAGNOSIS — I110 Hypertensive heart disease with heart failure: Secondary | ICD-10-CM | POA: Insufficient documentation

## 2017-05-18 DIAGNOSIS — F329 Major depressive disorder, single episode, unspecified: Secondary | ICD-10-CM | POA: Diagnosis not present

## 2017-05-18 DIAGNOSIS — R32 Unspecified urinary incontinence: Secondary | ICD-10-CM | POA: Diagnosis not present

## 2017-05-18 DIAGNOSIS — I509 Heart failure, unspecified: Secondary | ICD-10-CM | POA: Insufficient documentation

## 2017-05-18 DIAGNOSIS — I5022 Chronic systolic (congestive) heart failure: Secondary | ICD-10-CM | POA: Diagnosis not present

## 2017-05-18 DIAGNOSIS — Z794 Long term (current) use of insulin: Secondary | ICD-10-CM | POA: Diagnosis not present

## 2017-05-18 DIAGNOSIS — R4182 Altered mental status, unspecified: Secondary | ICD-10-CM | POA: Insufficient documentation

## 2017-05-18 DIAGNOSIS — R569 Unspecified convulsions: Principal | ICD-10-CM | POA: Insufficient documentation

## 2017-05-18 DIAGNOSIS — N184 Chronic kidney disease, stage 4 (severe): Secondary | ICD-10-CM | POA: Diagnosis not present

## 2017-05-18 DIAGNOSIS — F10129 Alcohol abuse with intoxication, unspecified: Secondary | ICD-10-CM | POA: Diagnosis not present

## 2017-05-18 DIAGNOSIS — E1140 Type 2 diabetes mellitus with diabetic neuropathy, unspecified: Secondary | ICD-10-CM | POA: Diagnosis not present

## 2017-05-18 DIAGNOSIS — N189 Chronic kidney disease, unspecified: Secondary | ICD-10-CM

## 2017-05-18 DIAGNOSIS — Z87891 Personal history of nicotine dependence: Secondary | ICD-10-CM | POA: Diagnosis not present

## 2017-05-18 DIAGNOSIS — I340 Nonrheumatic mitral (valve) insufficiency: Secondary | ICD-10-CM | POA: Insufficient documentation

## 2017-05-18 DIAGNOSIS — Z8249 Family history of ischemic heart disease and other diseases of the circulatory system: Secondary | ICD-10-CM | POA: Insufficient documentation

## 2017-05-18 DIAGNOSIS — R778 Other specified abnormalities of plasma proteins: Secondary | ICD-10-CM

## 2017-05-18 DIAGNOSIS — Z79899 Other long term (current) drug therapy: Secondary | ICD-10-CM | POA: Insufficient documentation

## 2017-05-18 DIAGNOSIS — F1010 Alcohol abuse, uncomplicated: Secondary | ICD-10-CM

## 2017-05-18 DIAGNOSIS — I6523 Occlusion and stenosis of bilateral carotid arteries: Secondary | ICD-10-CM | POA: Diagnosis not present

## 2017-05-18 LAB — TROPONIN I
Troponin I: 0.09 ng/mL (ref <0.03)
Troponin I: 0.21 ng/mL (ref <0.03)

## 2017-05-18 LAB — URINALYSIS, COMPLETE (UACMP) WITH MICROSCOPIC
Bilirubin Urine: NEGATIVE
KETONES UR: NEGATIVE mg/dL
Leukocytes, UA: NEGATIVE
NITRITE: NEGATIVE
Protein, ur: 30 mg/dL — AB
SQUAMOUS EPITHELIAL / LPF: NONE SEEN
Specific Gravity, Urine: 1.008 (ref 1.005–1.030)
pH: 5 (ref 5.0–8.0)

## 2017-05-18 LAB — CBC WITH DIFFERENTIAL/PLATELET
BASOS ABS: 0 10*3/uL (ref 0–0.1)
BASOS PCT: 0 %
EOS ABS: 0 10*3/uL (ref 0–0.7)
Eosinophils Relative: 1 %
HEMATOCRIT: 36 % — AB (ref 40.0–52.0)
HEMOGLOBIN: 12.1 g/dL — AB (ref 13.0–18.0)
Lymphocytes Relative: 27 %
Lymphs Abs: 1 10*3/uL (ref 1.0–3.6)
MCH: 30.6 pg (ref 26.0–34.0)
MCHC: 33.5 g/dL (ref 32.0–36.0)
MCV: 91.4 fL (ref 80.0–100.0)
Monocytes Absolute: 0.3 10*3/uL (ref 0.2–1.0)
Monocytes Relative: 8 %
NEUTROS ABS: 2.3 10*3/uL (ref 1.4–6.5)
NEUTROS PCT: 64 %
Platelets: 153 10*3/uL (ref 150–440)
RBC: 3.94 MIL/uL — AB (ref 4.40–5.90)
RDW: 13 % (ref 11.5–14.5)
WBC: 3.6 10*3/uL — AB (ref 3.8–10.6)

## 2017-05-18 LAB — URINE DRUG SCREEN, QUALITATIVE (ARMC ONLY)
Amphetamines, Ur Screen: NOT DETECTED
BARBITURATES, UR SCREEN: NOT DETECTED
Benzodiazepine, Ur Scrn: NOT DETECTED
Cannabinoid 50 Ng, Ur ~~LOC~~: NOT DETECTED
Cocaine Metabolite,Ur ~~LOC~~: POSITIVE — AB
MDMA (Ecstasy)Ur Screen: NOT DETECTED
METHADONE SCREEN, URINE: NOT DETECTED
OPIATE, UR SCREEN: NOT DETECTED
PHENCYCLIDINE (PCP) UR S: NOT DETECTED
Tricyclic, Ur Screen: NOT DETECTED

## 2017-05-18 LAB — COMPREHENSIVE METABOLIC PANEL
ALBUMIN: 3.9 g/dL (ref 3.5–5.0)
ALK PHOS: 73 U/L (ref 38–126)
ALT: 75 U/L — AB (ref 17–63)
AST: 122 U/L — AB (ref 15–41)
Anion gap: 12 (ref 5–15)
BILIRUBIN TOTAL: 0.7 mg/dL (ref 0.3–1.2)
BUN: 27 mg/dL — AB (ref 6–20)
CALCIUM: 9 mg/dL (ref 8.9–10.3)
CO2: 18 mmol/L — AB (ref 22–32)
Chloride: 102 mmol/L (ref 101–111)
Creatinine, Ser: 2.21 mg/dL — ABNORMAL HIGH (ref 0.61–1.24)
GFR calc Af Amer: 35 mL/min — ABNORMAL LOW (ref >60)
GFR calc non Af Amer: 30 mL/min — ABNORMAL LOW (ref >60)
GLUCOSE: 253 mg/dL — AB (ref 65–99)
Potassium: 3.9 mmol/L (ref 3.5–5.1)
SODIUM: 132 mmol/L — AB (ref 135–145)
TOTAL PROTEIN: 7.2 g/dL (ref 6.5–8.1)

## 2017-05-18 LAB — ETHANOL: ALCOHOL ETHYL (B): 60 mg/dL — AB (ref <5)

## 2017-05-18 LAB — GLUCOSE, CAPILLARY: GLUCOSE-CAPILLARY: 180 mg/dL — AB (ref 65–99)

## 2017-05-18 LAB — MAGNESIUM: Magnesium: 2 mg/dL (ref 1.7–2.4)

## 2017-05-18 LAB — CK: CK TOTAL: 492 U/L — AB (ref 49–397)

## 2017-05-18 LAB — MRSA PCR SCREENING: MRSA BY PCR: NEGATIVE

## 2017-05-18 MED ORDER — ACETAMINOPHEN 325 MG PO TABS: 650.0000 mg | ORAL_TABLET | Freq: Four times a day (QID) | ORAL | Status: DC | PRN

## 2017-05-18 MED ORDER — ASPIRIN EC 81 MG PO TBEC
81.0000 mg | DELAYED_RELEASE_TABLET | Freq: Every day | ORAL | Status: DC
  Administered 2017-05-18 – 2017-05-20 (×3): 81 mg via ORAL
  Filled 2017-05-18 (×3): qty 1

## 2017-05-18 MED ORDER — LISINOPRIL 10 MG PO TABS
10.0000 mg | ORAL_TABLET | Freq: Every day | ORAL | Status: DC
  Administered 2017-05-18 – 2017-05-20 (×3): 10 mg via ORAL
  Filled 2017-05-18 (×3): qty 1

## 2017-05-18 MED ORDER — INSULIN ASPART 100 UNIT/ML ~~LOC~~ SOLN
0.0000 [IU] | Freq: Three times a day (TID) | SUBCUTANEOUS | Status: DC
  Administered 2017-05-19 – 2017-05-20 (×2): 1 [IU] via SUBCUTANEOUS
  Filled 2017-05-18 (×2): qty 1

## 2017-05-18 MED ORDER — FUROSEMIDE 20 MG PO TABS
20.0000 mg | ORAL_TABLET | Freq: Two times a day (BID) | ORAL | Status: DC
  Administered 2017-05-19 – 2017-05-20 (×3): 20 mg via ORAL
  Filled 2017-05-18 (×3): qty 1

## 2017-05-18 MED ORDER — ALBUTEROL SULFATE (2.5 MG/3ML) 0.083% IN NEBU: 2.5000 mg | INHALATION_SOLUTION | RESPIRATORY_TRACT | Status: DC | PRN

## 2017-05-18 MED ORDER — ONDANSETRON HCL 4 MG/2ML IJ SOLN: 4.0000 mg | Freq: Four times a day (QID) | INTRAMUSCULAR | Status: DC | PRN

## 2017-05-18 MED ORDER — VITAMIN B-1 100 MG PO TABS
100.0000 mg | ORAL_TABLET | Freq: Every day | ORAL | Status: DC
  Administered 2017-05-18 – 2017-05-20 (×3): 100 mg via ORAL
  Filled 2017-05-18 (×3): qty 1

## 2017-05-18 MED ORDER — CITALOPRAM HYDROBROMIDE 20 MG PO TABS
20.0000 mg | ORAL_TABLET | Freq: Every day | ORAL | Status: DC
  Administered 2017-05-18 – 2017-05-20 (×3): 20 mg via ORAL
  Filled 2017-05-18 (×3): qty 1

## 2017-05-18 MED ORDER — POTASSIUM CHLORIDE ER 10 MEQ PO TBCR
10.0000 meq | EXTENDED_RELEASE_TABLET | Freq: Every day | ORAL | Status: DC
  Administered 2017-05-18 – 2017-05-19 (×2): 10 meq via ORAL
  Filled 2017-05-18 (×4): qty 1

## 2017-05-18 MED ORDER — CARVEDILOL 6.25 MG PO TABS
6.2500 mg | ORAL_TABLET | Freq: Two times a day (BID) | ORAL | Status: DC
  Administered 2017-05-18 – 2017-05-20 (×4): 6.25 mg via ORAL
  Filled 2017-05-18 (×5): qty 1

## 2017-05-18 MED ORDER — FOLIC ACID 1 MG PO TABS
1.0000 mg | ORAL_TABLET | Freq: Every day | ORAL | Status: DC
  Administered 2017-05-18 – 2017-05-20 (×3): 1 mg via ORAL
  Filled 2017-05-18 (×3): qty 1

## 2017-05-18 MED ORDER — ATORVASTATIN CALCIUM 20 MG PO TABS
80.0000 mg | ORAL_TABLET | Freq: Every evening | ORAL | Status: DC
  Administered 2017-05-18 – 2017-05-19 (×2): 80 mg via ORAL
  Filled 2017-05-18 (×2): qty 4

## 2017-05-18 MED ORDER — HEPARIN SODIUM (PORCINE) 5000 UNIT/ML IJ SOLN
5000.0000 [IU] | Freq: Three times a day (TID) | INTRAMUSCULAR | Status: DC
  Administered 2017-05-18 – 2017-05-20 (×5): 5000 [IU] via SUBCUTANEOUS
  Filled 2017-05-18 (×5): qty 1

## 2017-05-18 MED ORDER — SENNOSIDES-DOCUSATE SODIUM 8.6-50 MG PO TABS: 1.0000 | ORAL_TABLET | Freq: Every evening | ORAL | Status: DC | PRN

## 2017-05-18 MED ORDER — LORAZEPAM 2 MG/ML IJ SOLN: 1.0000 mg | Freq: Four times a day (QID) | INTRAMUSCULAR | Status: DC | PRN

## 2017-05-18 MED ORDER — GABAPENTIN 300 MG PO CAPS
900.0000 mg | ORAL_CAPSULE | Freq: Every day | ORAL | Status: DC
  Administered 2017-05-18 – 2017-05-19 (×2): 900 mg via ORAL
  Filled 2017-05-18 (×2): qty 3

## 2017-05-18 MED ORDER — LORAZEPAM 1 MG PO TABS: 1.0000 mg | ORAL_TABLET | Freq: Four times a day (QID) | ORAL | Status: DC | PRN

## 2017-05-18 MED ORDER — INSULIN ASPART 100 UNIT/ML ~~LOC~~ SOLN: 0.0000 [IU] | Freq: Every day | SUBCUTANEOUS | Status: DC

## 2017-05-18 MED ORDER — THIAMINE HCL 100 MG/ML IJ SOLN: 100.0000 mg | Freq: Every day | INTRAMUSCULAR | Status: DC

## 2017-05-18 MED ORDER — SODIUM CHLORIDE 0.9 % IV SOLN
INTRAVENOUS | Status: DC
  Administered 2017-05-18 – 2017-05-19 (×3): via INTRAVENOUS

## 2017-05-18 MED ORDER — INSULIN GLARGINE 100 UNIT/ML ~~LOC~~ SOLN
27.0000 [IU] | Freq: Every day | SUBCUTANEOUS | Status: DC
  Administered 2017-05-18 – 2017-05-19 (×2): 27 [IU] via SUBCUTANEOUS
  Filled 2017-05-18 (×3): qty 0.27

## 2017-05-18 MED ORDER — HYDROCODONE-ACETAMINOPHEN 5-325 MG PO TABS: 1.0000 | ORAL_TABLET | ORAL | Status: DC | PRN

## 2017-05-18 MED ORDER — ONDANSETRON HCL 4 MG PO TABS: 4.0000 mg | ORAL_TABLET | Freq: Four times a day (QID) | ORAL | Status: DC | PRN

## 2017-05-18 MED ORDER — ACETAMINOPHEN 650 MG RE SUPP: 650.0000 mg | Freq: Four times a day (QID) | RECTAL | Status: DC | PRN

## 2017-05-18 MED ORDER — TAMSULOSIN HCL 0.4 MG PO CAPS
0.4000 mg | ORAL_CAPSULE | Freq: Every day | ORAL | Status: DC
  Administered 2017-05-18 – 2017-05-20 (×3): 0.4 mg via ORAL
  Filled 2017-05-18 (×3): qty 1

## 2017-05-18 MED ORDER — ADULT MULTIVITAMIN W/MINERALS CH
1.0000 | ORAL_TABLET | Freq: Every day | ORAL | Status: DC
  Administered 2017-05-18 – 2017-05-20 (×3): 1 via ORAL
  Filled 2017-05-18 (×3): qty 1

## 2017-05-18 MED ORDER — SODIUM CHLORIDE 0.9 % IV SOLN
Freq: Once | INTRAVENOUS | Status: AC
  Administered 2017-05-18: 17:00:00 via INTRAVENOUS
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
## 2017-05-19 ENCOUNTER — Observation Stay: Payer: Medicaid Other

## 2017-05-19 ENCOUNTER — Observation Stay
Admit: 2017-05-19 | Discharge: 2017-05-19 | Disposition: A | Discharge status: Home / Self Care | Payer: Medicaid Other | Attending: Cardiovascular Disease | Admitting: Cardiovascular Disease

## 2017-05-19 ENCOUNTER — Observation Stay: Admit: 2017-05-19 | Payer: Medicaid Other

## 2017-05-19 DIAGNOSIS — R55 Syncope and collapse: Secondary | ICD-10-CM

## 2017-05-19 LAB — GLUCOSE, CAPILLARY
GLUCOSE-CAPILLARY: 72 mg/dL (ref 65–99)
GLUCOSE-CAPILLARY: 108 mg/dL — AB (ref 65–99)
Glucose-Capillary: 150 mg/dL — ABNORMAL HIGH (ref 65–99)
Glucose-Capillary: 174 mg/dL — ABNORMAL HIGH (ref 65–99)

## 2017-05-19 LAB — TROPONIN I: TROPONIN I: 0.38 ng/mL — AB (ref <0.03)

## 2017-05-19 MED ORDER — INSULIN GLARGINE 100 UNIT/ML ~~LOC~~ SOLN: 27.0000 [IU] | Freq: Every day | SUBCUTANEOUS | 0 refills | Status: DC
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
## 2017-05-19 NOTE — Progress Notes (Signed)
Dr. Anne Hahn returned page no new orders at this time.

## 2017-05-19 NOTE — Care Management (Signed)
Patient with known etoh abuse and positive for cocaine placed in observation after syncope episode in which there was a loss of consciousness.Marland Kitchen He is independent in all his adls  EEG and Echo pending.   Patient does not feel he has a problem with substance abuse and declines he needs any resources.  he does have a PCP- Dr Gershon Crane at the [facility]

## 2017-05-19 NOTE — Progress Notes (Signed)
*  PRELIMINARY RESULTS* Echocardiogram 2D Echocardiogram has been performed.  Cristela Blue 05/19/2017, 1:08 PM

## 2017-05-19 NOTE — Progress Notes (Signed)
Inpatient Diabetes Program Recommendations  AACE/ADA: New Consensus Statement on Inpatient Glycemic Control (2015)  Target Ranges:  Prepandial:   less than 140 mg/dL      Peak postprandial:   less than 180 mg/dL (1-2 hours)      Critically ill patients:  140 - 180 mg/dL   Lab Results  Component Value Date   GLUCAP 72 05/19/2017   HGBA1C 7.9 (H) 03/11/2015    Review of Glycemic ControlResults for [name], [name] (MRN [id number]) as of 05/19/2017 12:20  Ref. Range 05/18/2017 21:18 05/19/2017 07:37  Glucose-Capillary Latest Ref Range: 65 - 99 mg/dL 409 (H) 72   Diabetes history: Type 2 diabetes Outpatient Diabetes medications: Lantus 27 units q HS Current orders for Inpatient glycemic control:  Lantus 27 units daily, Novolog sensitive tid with meals and HS  Inpatient Diabetes Program Recommendations:   Please consider decreasing Lantus to 20 units daily.    Thanks, Beryl Meager, RN, BC-ADM Inpatient Diabetes Coordinator Pager [phone] (8a-5p)

## 2017-05-19 NOTE — Progress Notes (Signed)
Pt. Arrived via stretcher, staff transferred to bed. Pt. Alert and oriented x 4. Pt. On room air. Home meds sent to pharmacy. Pt. Given a bath related to being incontinent of stool. Pt. Pleasant and cooperative. Skin assessment completed with Crystal, RN. Abrasion to left side of bridge of nose and right shin. 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 Multiple toes to bilateral feet do not have toenails.  Tele box verified with CCMD with Marchelle Folks, CNA. General room orientation given. Instruction on how to use ascom and call bell system given. Fall risk assessment sheet signed, pt. A high fall risk. Yellow precaution band placed on right wrist. Pt. Had no c/o or signs of pain, SOB or acute distress. Will continue to monitor pt.

## 2017-05-19 NOTE — Consult Note (Signed)
Maurice Jordan is a 61 y.o. male  [id number]  Primary Cardiologist: Adrian Blackwater Reason for Consultation: Syncope  HPI: This is a 61 year old African-American male with history of alcohol abuse congestive heart failure hypertension hyperlipidemia presented to the hospital after passing out. Apparently CPR was attempted thinking the patient had cardiac arrest and was aborted after vitals were stable. Patient at this time denies any chest pain shortness of breath and is quite alert and oriented.   Review of Systems: No orthopnea PND or leg swelling   Past Medical History:  Diagnosis Date  . Alcohol abuse   . Congestive heart failure (HCC)   . Depression   . Diabetes (HCC)   . Hx MRSA infection 04/15/2014  . Hyperlipidemia   . Hypertension   . Neuropathy   . Osteomyelitis of toe (HCC)    3rd toe    Medications Prior to Admission  Medication Sig Dispense Refill  . aspirin EC 81 MG tablet Take 81 mg by mouth daily.    Marland Kitchen atorvastatin (LIPITOR) 80 MG tablet Take 80 mg by mouth every evening.    . carvedilol (COREG) 6.25 MG tablet Take 6.25 mg by mouth 2 (two) times daily.    . citalopram (CELEXA) 20 MG tablet Take 20 mg by mouth daily.    . furosemide (LASIX) 20 MG tablet Take 20 mg by mouth 2 (two) times daily.    Marland Kitchen gabapentin (NEURONTIN) 300 MG capsule Take 900 mg by mouth at bedtime.    . insulin glargine (LANTUS) 100 UNIT/ML injection Inject 0.1 mLs (10 Units total) into the skin at bedtime. (Patient taking differently: Inject 27 Units into the skin at bedtime. ) 10 mL 11  . lisinopril (PRINIVIL,ZESTRIL) 20 MG tablet Take 1 tablet (20 mg total) by mouth daily. (Patient taking differently: Take 10 mg by mouth daily. ) 30 tablet 6  . potassium chloride (K-DUR) 10 MEQ tablet Take 10 mEq by mouth at bedtime.    . tamsulosin (FLOMAX) 0.4 MG CAPS capsule Take 0.4 mg by mouth daily.       Marland Kitchen aspirin EC  81 mg Oral Daily  . atorvastatin  80 mg Oral QPM  . carvedilol  6.25 mg Oral  BID  . citalopram  20 mg Oral Daily  . folic acid  1 mg Oral Daily  . furosemide  20 mg Oral BID  . gabapentin  900 mg Oral QHS  . heparin  5,000 Units Subcutaneous Q8H  . insulin aspart  0-5 Units Subcutaneous QHS  . insulin aspart  0-9 Units Subcutaneous TID WC  . insulin glargine  27 Units Subcutaneous QHS  . lisinopril  10 mg Oral Daily  . multivitamin with minerals  1 tablet Oral Daily  . potassium chloride  10 mEq Oral QHS  . tamsulosin  0.4 mg Oral Daily  . thiamine  100 mg Oral Daily   Or  . thiamine  100 mg Intravenous Daily    Infusions: . sodium chloride 75 mL/hr at 05/18/17 2105    No Known Allergies  Social History   Social History  . Marital status: Single    Spouse name: N/A  . Number of children: N/A  . Years of education: N/A   Occupational History  . Not on file.   Social History Main Topics  . Smoking status: Never Smoker  . Smokeless tobacco: Former Neurosurgeon  . Alcohol use Yes     Comment: 2-3 40's a day  . Drug use: No  .  Sexual activity: Not on file   Other Topics Concern  . Not on file   Social History Narrative  . No narrative on file    History reviewed. No pertinent family history.  PHYSICAL EXAM: Vitals:   05/18/17 2038 05/19/17 0319  BP: (!) 153/93 121/77  Pulse: 89 80  Resp: 18 18  Temp: 98.7 F (37.1 C) 98.6 F (37 C)     Intake/Output Summary (Last 24 hours) at 05/19/17 0832 Last data filed at 05/19/17 0654  Gross per 24 hour  Intake           696.25 ml  Output             2100 ml  Net         -1403.75 ml    General:  Well appearing. No respiratory difficulty HEENT: normal Neck: supple. no JVD. Carotids 2+ bilat; no bruits. No lymphadenopathy or thryomegaly appreciated. Cor: PMI nondisplaced. Regular rate & rhythm. No rubs, gallops or murmurs. Lungs: clear Abdomen: soft, nontender, nondistended. No hepatosplenomegaly. No bruits or masses. Good bowel sounds. Extremities: no cyanosis, clubbing, rash, edema Neuro:  alert & oriented x 3, cranial nerves grossly intact. moves all 4 extremities w/o difficulty. Affect pleasant.  BJY:NWGNF rhythm with LVH and poor R-wave progression and inferolateral ST depression suggestive of ischemia.  Results for orders placed or performed during the hospital encounter of 05/18/17 (from the past 24 hour(s))  CBC with Differential     Status: Abnormal   Collection Time: 05/18/17  4:31 PM  Result Value Ref Range   WBC 3.6 (L) 3.8 - 10.6 K/uL   RBC 3.94 (L) 4.40 - 5.90 MIL/uL   Hemoglobin 12.1 (L) 13.0 - 18.0 g/dL   HCT 62.1 (L) 30.8 - 65.7 %   MCV 91.4 80.0 - 100.0 fL   MCH 30.6 26.0 - 34.0 pg   MCHC 33.5 32.0 - 36.0 g/dL   RDW 84.6 96.2 - 95.2 %   Platelets 153 150 - 440 K/uL   Neutrophils Relative % 64 %   Neutro Abs 2.3 1.4 - 6.5 K/uL   Lymphocytes Relative 27 %   Lymphs Abs 1.0 1.0 - 3.6 K/uL   Monocytes Relative 8 %   Monocytes Absolute 0.3 0.2 - 1.0 K/uL   Eosinophils Relative 1 %   Eosinophils Absolute 0.0 0 - 0.7 K/uL   Basophils Relative 0 %   Basophils Absolute 0.0 0 - 0.1 K/uL  Comprehensive metabolic panel     Status: Abnormal   Collection Time: 05/18/17  4:31 PM  Result Value Ref Range   Sodium 132 (L) 135 - 145 mmol/L   Potassium 3.9 3.5 - 5.1 mmol/L   Chloride 102 101 - 111 mmol/L   CO2 18 (L) 22 - 32 mmol/L   Glucose, Bld 253 (H) 65 - 99 mg/dL   BUN 27 (H) 6 - 20 mg/dL   Creatinine, Ser 8.41 (H) 0.61 - 1.24 mg/dL   Calcium 9.0 8.9 - 32.4 mg/dL   Total Protein 7.2 6.5 - 8.1 g/dL   Albumin 3.9 3.5 - 5.0 g/dL   AST 401 (H) 15 - 41 U/L   ALT 75 (H) 17 - 63 U/L   Alkaline Phosphatase 73 38 - 126 U/L   Total Bilirubin 0.7 0.3 - 1.2 mg/dL   GFR calc non Af Amer 30 (L) >60 mL/min   GFR calc Af Amer 35 (L) >60 mL/min   Anion gap 12 5 - 15  Troponin I  Status: Abnormal   Collection Time: 05/18/17  4:31 PM  Result Value Ref Range   Troponin I 0.09 (HH) <0.03 ng/mL  Ethanol     Status: Abnormal   Collection Time: 05/18/17  4:31 PM  Result  Value Ref Range   Alcohol, Ethyl (B) 60 (H) <5 mg/dL  CK     Status: Abnormal   Collection Time: 05/18/17  4:31 PM  Result Value Ref Range   Total CK 492 (H) 49 - 397 U/L  Urinalysis, Complete w Microscopic     Status: Abnormal   Collection Time: 05/18/17  6:00 PM  Result Value Ref Range   Color, Urine STRAW (A) YELLOW   APPearance CLEAR (A) CLEAR   Specific Gravity, Urine 1.008 1.005 - 1.030   pH 5.0 5.0 - 8.0   Glucose, UA >=500 (A) NEGATIVE mg/dL   Hgb urine dipstick MODERATE (A) NEGATIVE   Bilirubin Urine NEGATIVE NEGATIVE   Ketones, ur NEGATIVE NEGATIVE mg/dL   Protein, ur 30 (A) NEGATIVE mg/dL   Nitrite NEGATIVE NEGATIVE   Leukocytes, UA NEGATIVE NEGATIVE   RBC / HPF 0-5 0 - 5 RBC/hpf   WBC, UA 0-5 0 - 5 WBC/hpf   Bacteria, UA RARE (A) NONE SEEN   Squamous Epithelial / LPF NONE SEEN NONE SEEN   Mucous PRESENT    Granular Casts, UA PRESENT   Urine Drug Screen, Qualitative ([facility] only)     Status: Abnormal   Collection Time: 05/18/17  6:00 PM  Result Value Ref Range   Tricyclic, Ur Screen NONE DETECTED NONE DETECTED   Amphetamines, Ur Screen NONE DETECTED NONE DETECTED   MDMA (Ecstasy)Ur Screen NONE DETECTED NONE DETECTED   Cocaine Metabolite,Ur [place] POSITIVE (A) NONE DETECTED   Opiate, Ur Screen NONE DETECTED NONE DETECTED   Phencyclidine (PCP) Ur S NONE DETECTED NONE DETECTED   Cannabinoid 50 Ng, Ur [place] NONE DETECTED NONE DETECTED   Barbiturates, Ur Screen NONE DETECTED NONE DETECTED   Benzodiazepine, Ur Scrn NONE DETECTED NONE DETECTED   Methadone Scn, Ur NONE DETECTED NONE DETECTED  Magnesium     Status: None   Collection Time: 05/18/17  8:33 PM  Result Value Ref Range   Magnesium 2.0 1.7 - 2.4 mg/dL  MRSA PCR Screening     Status: None   Collection Time: 05/18/17  9:11 PM  Result Value Ref Range   MRSA by PCR NEGATIVE NEGATIVE  Glucose, capillary     Status: Abnormal   Collection Time: 05/18/17  9:18 PM  Result Value Ref Range   Glucose-Capillary 180 (H) 65 - 99  mg/dL   Comment 1 Notify RN    Comment 2 Document in Chart   Troponin I     Status: Abnormal   Collection Time: 05/18/17 10:12 PM  Result Value Ref Range   Troponin I 0.21 (HH) <0.03 ng/mL  Troponin I     Status: Abnormal   Collection Time: 05/19/17  4:33 AM  Result Value Ref Range   Troponin I 0.38 (HH) <0.03 ng/mL  Glucose, capillary     Status: None   Collection Time: 05/19/17  7:37 AM  Result Value Ref Range   Glucose-Capillary 72 65 - 99 mg/dL   Dg Chest 2 View  Result Date: 05/18/2017 CLINICAL DATA:  Seizure. EXAM: CHEST  2 VIEW COMPARISON:  Radiographs of February 13, 2015. FINDINGS: Stable cardiomediastinal silhouette. Right lung is clear. Minimal left basilar subsegmental atelectasis is noted. The visualized skeletal structures are unremarkable. No pneumothorax or pleural  effusion is noted. IMPRESSION: Minimal left basilar subsegmental atelectasis. Electronically Signed   By: Lupita Raider, M.D.   On: 05/18/2017 17:14   Ct Head Wo Contrast  Result Date: 05/18/2017 CLINICAL DATA:  Seizure. EXAM: CT HEAD WITHOUT CONTRAST TECHNIQUE: Contiguous axial images were obtained from the base of the skull through the vertex without intravenous contrast. COMPARISON:  CT scan of Mar 11, 2015. FINDINGS: Brain: Mild diffuse cortical atrophy is noted. No mass effect or midline shift is noted. Ventricular size is within normal limits. There is no evidence of mass lesion, hemorrhage or acute infarction. Vascular: No hyperdense vessel or unexpected calcification. Skull: Normal. Negative for fracture or focal lesion. Sinuses/Orbits: No acute finding. Other: None. IMPRESSION: Mild diffuse cortical atrophy. No acute intracranial abnormality seen. Electronically Signed   By: Lupita Raider, M.D.   On: 05/18/2017 17:08     ASSESSMENT AND PLAN: Status post syncope with mildly elevated troponin and renal insufficiency. Patient has history of alcohol abuse and syncope may be related to that versus arrhythmia.  Will get echocardiogram to evaluate ejection fraction and wall motion. Patient does have elevated troponin which is trending up to 0.36 but may be due to demand ischemia versus renal insufficiency and CPR. Patient would be high risk for cardiac catheterization due to creatinine being over 2. Will get echocardiogram make further recommendation.  Ashlye Oviedo A

## 2017-05-19 NOTE — Progress Notes (Signed)
Dr. Sheryle Hail notified of increased troponin 0.38, no new orders
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
## 2017-05-19 NOTE — Progress Notes (Signed)
Prime paged about increased troponin 0.21, awaiting call back.

## 2017-05-20 LAB — GLUCOSE, CAPILLARY
GLUCOSE-CAPILLARY: 86 mg/dL (ref 65–99)
Glucose-Capillary: 141 mg/dL — ABNORMAL HIGH (ref 65–99)

## 2017-05-20 LAB — BASIC METABOLIC PANEL
ANION GAP: 4 — AB (ref 5–15)
BUN: 26 mg/dL — ABNORMAL HIGH (ref 6–20)
CHLORIDE: 107 mmol/L (ref 101–111)
CO2: 28 mmol/L (ref 22–32)
Calcium: 8.7 mg/dL — ABNORMAL LOW (ref 8.9–10.3)
Creatinine, Ser: 1.9 mg/dL — ABNORMAL HIGH (ref 0.61–1.24)
GFR calc non Af Amer: 36 mL/min — ABNORMAL LOW (ref >60)
GFR, EST AFRICAN AMERICAN: 42 mL/min — AB (ref >60)
Glucose, Bld: 166 mg/dL — ABNORMAL HIGH (ref 65–99)
Potassium: 4.2 mmol/L (ref 3.5–5.1)
Sodium: 139 mmol/L (ref 135–145)

## 2017-05-20 LAB — ECHOCARDIOGRAM COMPLETE
Height: 66 in
Weight: 2646.4 oz

## 2017-05-20 LAB — HIV ANTIBODY (ROUTINE TESTING W REFLEX): HIV Screen 4th Generation wRfx: NONREACTIVE

## 2017-05-20 LAB — TROPONIN I: Troponin I: 0.26 ng/mL (ref <0.03)

## 2017-05-20 MED ORDER — TAMSULOSIN HCL 0.4 MG PO CAPS: 0.4000 mg | ORAL_CAPSULE | Freq: Every day | ORAL | 2 refills | Status: AC

## 2017-05-20 MED ORDER — ATORVASTATIN CALCIUM 80 MG PO TABS: 80.0000 mg | ORAL_TABLET | Freq: Every evening | ORAL | 2 refills | Status: AC

## 2017-05-20 MED ORDER — ASPIRIN EC 81 MG PO TBEC: 81.0000 mg | DELAYED_RELEASE_TABLET | Freq: Every day | ORAL | 2 refills | Status: DC

## 2017-05-20 MED ORDER — FUROSEMIDE 20 MG PO TABS: 20.0000 mg | ORAL_TABLET | Freq: Two times a day (BID) | ORAL | 2 refills | Status: DC

## 2017-05-20 MED ORDER — SACUBITRIL-VALSARTAN 24-26 MG PO TABS: 1.0000 | ORAL_TABLET | Freq: Two times a day (BID) | ORAL | 2 refills | Status: DC

## 2017-05-20 MED ORDER — SACUBITRIL-VALSARTAN 24-26 MG PO TABS: 1.0000 | ORAL_TABLET | Freq: Two times a day (BID) | ORAL | Status: DC

## 2017-05-20 MED ORDER — CARVEDILOL 6.25 MG PO TABS: 6.2500 mg | ORAL_TABLET | Freq: Two times a day (BID) | ORAL | 2 refills | Status: DC

## 2017-05-20 MED ORDER — CITALOPRAM HYDROBROMIDE 20 MG PO TABS: 20.0000 mg | ORAL_TABLET | Freq: Every day | ORAL | 2 refills | Status: AC

## 2017-05-20 MED ORDER — INSULIN GLARGINE 100 UNIT/ML ~~LOC~~ SOLN: 27.0000 [IU] | Freq: Every day | SUBCUTANEOUS | 0 refills | Status: DC

## 2017-05-20 NOTE — Care Management (Signed)
Discussed during progression of need to allow patient to ambulate in the halls prior to discharge.  Patient had no difficulty ambulating independently.  He is to discharge home on [place].  Confirmed patient had active medicaid and drug coverage.  Initiated PA through [employer] and this should be completed within 24 hours or less- PA [id number]. Reference to call [id number]. Patient usually uses [facility] but does have this drug in stock.  Patient says he will use [facility] in [place] and informed this drug/dosage is in stock.  Informed patient and provided him with 30 day trial coupon.  Patient requests cane because "I can't take my walker everywhere." Again discussed the hazards of alcohol and cocaine on his heart.  Part assures CM he does not have a problem with substance abuse and declines a second time to receive written resources.  Agreeable with discharge and declines any needs other than a cane "if you can spare one."  Provided
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
## 2017-05-20 NOTE — Progress Notes (Signed)
IV and tele removed. Discharge instructions given to patient along with hard copy prescription. No distress at this time. Patient verbalized understanding. Sister will be transporting patient home.

## 2017-05-20 NOTE — Progress Notes (Signed)
SUBJECTIVE: Patient is feeling much better denies any chest pain or shortness of breath   Vitals:   05/19/17 1931 05/19/17 1932 05/20/17 0519 05/20/17 0758  BP: (!) 152/93 (!) 141/84 138/83 134/83  Pulse: 78 79 72 74  Resp: 16  16 16   Temp: 98.7 F (37.1 C)  98.5 F (36.9 C) 97.8 F (36.6 C)  TempSrc: Oral  Oral Oral  SpO2: 99%  97% 98%  Weight:      Height:        Intake/Output Summary (Last 24 hours) at 05/20/17 0834 Last data filed at 05/20/17 6948  Gross per 24 hour  Intake                0 ml  Output             2050 ml  Net            -2050 ml    LABS: Basic Metabolic Panel:  Recent Labs  54/62/70 1631 05/18/17 2033 05/20/17 0415  NA 132*  --  139  K 3.9  --  4.2  CL 102  --  107  CO2 18*  --  28  GLUCOSE 253*  --  166*  BUN 27*  --  26*  CREATININE 2.21*  --  1.90*  CALCIUM 9.0  --  8.7*  MG  --  2.0  --    Liver Function Tests:  Recent Labs  05/18/17 1631  AST 122*  ALT 75*  ALKPHOS 73  BILITOT 0.7  PROT 7.2  ALBUMIN 3.9   No results for input(s): LIPASE, AMYLASE in the last 72 hours. CBC:  Recent Labs  05/18/17 1631  WBC 3.6*  NEUTROABS 2.3  HGB 12.1*  HCT 36.0*  MCV 91.4  PLT 153   Cardiac Enzymes:  Recent Labs  05/18/17 1631 05/18/17 2212 05/19/17 0433 05/20/17 0415  CKTOTAL 492*  --   --   --   TROPONINI 0.09* 0.21* 0.38* 0.26*   BNP: Invalid input(s): POCBNP D-Dimer: No results for input(s): DDIMER in the last 72 hours. Hemoglobin A1C: No results for input(s): HGBA1C in the last 72 hours. Fasting Lipid Panel: No results for input(s): CHOL, HDL, LDLCALC, TRIG, CHOLHDL, LDLDIRECT in the last 72 hours. Thyroid Function Tests: No results for input(s): TSH, T4TOTAL, T3FREE, THYROIDAB in the last 72 hours.  Invalid input(s): FREET3 Anemia Panel: No results for input(s): VITAMINB12, FOLATE, FERRITIN, TIBC, IRON, RETICCTPCT in the last 72 hours.   PHYSICAL EXAM General: Well developed, well nourished, in no acute  distress HEENT:  Normocephalic and atramatic Neck:  No JVD.  Lungs: Clear bilaterally to auscultation and percussion. Heart: HRRR . Normal S1 and S2 without gallops or murmurs.  Abdomen: Bowel sounds are positive, abdomen soft and non-tender  Msk:  Back normal, normal gait. Normal strength and tone for age. Extremities: No clubbing, cyanosis or edema.   Neuro: Alert and oriented X 3. Psych:  Good affect, responds appropriately  TELEMETRY:Sinus rhythm  ASSESSMENT AND PLAN: Status post syncope with mildly elevated troponin due to demand ischemia with severe left ventricle systolic dysfunction with left ventricular ejection fraction 25-30%. Advise lifevest as well as changing lisinopril to entresto. Also will discontinue KCl. Patient can be discharged after lifevest with follow-up in the office this Thursday at 11 AM.  Active Problems:   Syncope    Deny Chevez A, MD, [facility] 05/20/2017 8:34 AM

## 2017-05-20 NOTE — Discharge Instructions (Signed)
Heart Failure Clinic appointment on May 29 2017 at 9:00am with Clarisa Kindred, FNP. Please call [phone] to reschedule.

## 2017-05-22 NOTE — Care Management (Signed)
Received PA from [facility] for Entresto through 05/21/2018

## 2017-05-29 ENCOUNTER — Ambulatory Visit: Payer: Medicaid Other | Attending: Family | Admitting: Family

## 2017-05-29 ENCOUNTER — Encounter: Payer: Self-pay | Admitting: Family

## 2017-05-29 VITALS — BP 91/58 | HR 79 | Resp 20 | Ht 67.0 in | Wt 171.4 lb

## 2017-05-29 DIAGNOSIS — Z7982 Long term (current) use of aspirin: Secondary | ICD-10-CM | POA: Diagnosis not present

## 2017-05-29 DIAGNOSIS — I5022 Chronic systolic (congestive) heart failure: Secondary | ICD-10-CM | POA: Insufficient documentation

## 2017-05-29 DIAGNOSIS — M542 Cervicalgia: Secondary | ICD-10-CM | POA: Insufficient documentation

## 2017-05-29 DIAGNOSIS — E1169 Type 2 diabetes mellitus with other specified complication: Secondary | ICD-10-CM | POA: Insufficient documentation

## 2017-05-29 DIAGNOSIS — F1010 Alcohol abuse, uncomplicated: Secondary | ICD-10-CM

## 2017-05-29 DIAGNOSIS — I110 Hypertensive heart disease with heart failure: Secondary | ICD-10-CM | POA: Diagnosis present

## 2017-05-29 DIAGNOSIS — I959 Hypotension, unspecified: Secondary | ICD-10-CM | POA: Diagnosis not present

## 2017-05-29 DIAGNOSIS — Z794 Long term (current) use of insulin: Secondary | ICD-10-CM | POA: Diagnosis not present

## 2017-05-29 DIAGNOSIS — Z79899 Other long term (current) drug therapy: Secondary | ICD-10-CM | POA: Diagnosis not present

## 2017-05-29 DIAGNOSIS — N183 Chronic kidney disease, stage 3 (moderate): Secondary | ICD-10-CM

## 2017-05-29 DIAGNOSIS — E785 Hyperlipidemia, unspecified: Secondary | ICD-10-CM | POA: Insufficient documentation

## 2017-05-29 DIAGNOSIS — I952 Hypotension due to drugs: Secondary | ICD-10-CM

## 2017-05-29 DIAGNOSIS — E1022 Type 1 diabetes mellitus with diabetic chronic kidney disease: Secondary | ICD-10-CM

## 2017-05-29 NOTE — Progress Notes (Signed)
Patient ID: Maurice Jordan, male    DOB: [birth year], 61 y.o.   MRN: [id number]  HPI  Maurice Jordan is a 61 y/o male with a history of depression, DM, hyperlipidemia, HTN, osteomyelitis, current alcohol use and chronic heart failure.   Echo report from 05/19/17 reviewed and shows an EF of 30% along with mild Maurice and normal pulmonary artery pressures.  Admitted 05/18/17 due to syncope thought to be due to alcohol intoxication. MRI/EEG done and were negative. Cardiology and neurology consults were obtained. Carotid dopplers negative. Elevated troponin thought to be due to demand ischemia. Discharged home after 2 days.  He presents today for his initial visit with a chief complaint of mild neck pain. Says that it's been present for the last few days and he's unsure of why it's hurting. Denies any associated symptoms such as headache, blurry vision or numbness/tingling down his arm. No injury that he's aware of.   Past Medical History:  Diagnosis Date  . Alcohol abuse   . Congestive heart failure (HCC)   . Depression   . Diabetes (HCC)   . Hx MRSA infection 04/15/2014  . Hyperlipidemia   . Hypertension   . Neuropathy   . Osteomyelitis of toe (HCC)    3rd toe   Past Surgical History:  Procedure Laterality Date  . AMPUTATION OF REPLICATED TOES     No family history on file. Social History  Substance Use Topics  . Smoking status: Never Smoker  . Smokeless tobacco: Former Neurosurgeon  . Alcohol use Yes     Comment: 2-3 40's a day   No Known Allergies Prior to Admission medications   Medication Sig Start Date End Date Taking? Authorizing Provider  aspirin EC 81 MG tablet Take 1 tablet (81 mg total) by mouth daily. 05/20/17  Yes Enid Baas, MD  atorvastatin (LIPITOR) 80 MG tablet Take 1 tablet (80 mg total) by mouth every evening. 05/20/17  Yes Enid Baas, MD  carvedilol (COREG) 6.25 MG tablet Take 1 tablet (6.25 mg total) by mouth 2 (two) times daily. 05/20/17  Yes Enid Baas, MD  citalopram (CELEXA) 20 MG tablet Take 1 tablet (20 mg total) by mouth daily. 05/20/17  Yes Enid Baas, MD  furosemide (LASIX) 20 MG tablet Take 1 tablet (20 mg total) by mouth 2 (two) times daily. 05/20/17  Yes Enid Baas, MD  gabapentin (NEURONTIN) 300 MG capsule Take 900 mg by mouth at bedtime.   Yes [provider]  insulin glargine (LANTUS) 100 UNIT/ML injection Inject 0.27 mLs (27 Units total) into the skin at bedtime. 05/20/17  Yes Enid Baas, MD  sacubitril-valsartan (ENTRESTO) 24-26 MG Take 1 tablet by mouth 2 (two) times daily. 05/21/17  Yes Enid Baas, MD  tamsulosin (FLOMAX) 0.4 MG CAPS capsule Take 1 capsule (0.4 mg total) by mouth daily. 05/20/17  Yes Enid Baas, MD    Review of Systems  Constitutional: Negative for appetite change and fatigue.  HENT: Negative for congestion, rhinorrhea and sore throat.   Eyes: Negative.   Respiratory: Negative for cough, chest tightness and shortness of breath.   Cardiovascular: Negative for chest pain, palpitations and leg swelling.  Gastrointestinal: Negative for abdominal distention and abdominal pain.  Endocrine: Negative.   Genitourinary: Negative.   Musculoskeletal: Positive for neck pain. Negative for back pain.  Skin: Negative.   Allergic/Immunologic: Negative.   Neurological: Negative for dizziness and light-headedness.  Hematological: Negative for adenopathy. Does not bruise/bleed easily.  Psychiatric/Behavioral: Negative for dysphoric mood and sleep  disturbance (sleeping on 2 pillows). The patient is not nervous/anxious.    Vitals:   05/29/17 0941  BP: (!) 91/58  Pulse: 79  Resp: 20  SpO2: 98%  Weight: 171 lb 6 oz (77.7 kg)  Height: 5\' 7"  (1.702 m)   Wt Readings from Last 3 Encounters:  05/29/17 171 lb 6 oz (77.7 kg)  05/18/17 165 lb 6.4 oz (75 kg)  05/08/16 152 lb (68.9 kg)   Lab Results  Component Value Date   CREATININE 1.90 (H) 05/20/2017   CREATININE 2.21  (H) 05/18/2017   CREATININE 2.44 (H) 05/08/2016    Physical Exam  Constitutional: He is oriented to person, place, and time. He appears well-developed and well-nourished.  HENT:  Head: Normocephalic and atraumatic.  Neck: Normal range of motion. Neck supple. No JVD present.  Cardiovascular: Normal rate and regular rhythm.   Pulmonary/Chest: Effort normal and breath sounds normal.  Abdominal: Soft. He exhibits no distension. There is no tenderness.  Musculoskeletal: He exhibits no edema.       Cervical back: He exhibits tenderness.  Neurological: He is alert and oriented to person, place, and time.  Skin: Skin is warm and dry.  Psychiatric: He has a normal mood and affect. His behavior is normal. Thought content normal.  Nursing note and vitals reviewed.  Assessment & Plan:  1: Chronic heart failure with reduced ejection fraction-  - NYHA class I - euvolemic today - already weighing daily and he was instructed to call for an overnight weight gain of >2 pounds or a weekly weight gain of >5 pounds - does use "some" salt on foods. Hasn't been reading food labels. Extensively discussed how to read food labels so that he can closely follow a 2000mg  sodium diet; written dietary information was given to him about this - he was eating a bag of nacho flavored bugles while in the office so reviewed the food label of that item - sees PCP/cardiology at [facility]) and says that he has to make an appointment - remains active and walks daily; walked to a store yesterday and says that it was probably 1/2 mile one way; encouraged him to continue his activity with allowing for frequent rest periods as needed - ReDS vest reading not obtained as patient is asymptomatic - Pharm D went in and reviewed medications with the patient  2: Hypotension- - BP low but patient without dizziness - will decrease his furosemide to 20mg  daily - advised him that should he develop weight gain per above parameters,  edema or shortness of breath that he could take an additional 20mg  tablet - consider titrating up entresto if able  3: Diabetes- - glucose at home was 68 - continues on lantus  4: Alcohol use- - patient says that he's now only drinking a 16 ounce beer daily and no other alcohol; says that he was drinking close to 40 ounces daily - congratulated on decreasing amount and encouraged him to work on complete cessation  Patient did not bring his medications nor a list. Each medication was verbally reviewed with the patient and he was encouraged to bring the bottles to every visit to confirm accuracy of list.  Return in 1 month or sooner for any questions/problems before then.

## 2017-05-29 NOTE — Patient Instructions (Addendum)
Continue weighing daily and call for an overnight weight gain of > 2 pounds or a weekly weight gain of >5 pounds.  Decrease furosemide (lasix) to once daily. Take a second dose if you develop some swelling, shortness of breath or weight gain per above.    Low-Sodium Eating Plan Sodium, which is an element that makes up salt, helps you maintain a healthy balance of fluids in your body. Too much sodium can increase your blood pressure and cause fluid and waste to be held in your body. Your health care provider or dietitian may recommend following this plan if you have high blood pressure (hypertension), kidney disease, liver disease, or heart failure. Eating less sodium can help lower your blood pressure, reduce swelling, and protect your heart, liver, and kidneys. What are tips for following this plan? General guidelines  Most people on this plan should limit their sodium intake to 1,500-2,000 mg (milligrams) of sodium each day. Reading food labels  The Nutrition Facts label lists the amount of sodium in one serving of the food. If you eat more than one serving, you must multiply the listed amount of sodium by the number of servings.  Choose foods with less than 140 mg of sodium per serving.  Avoid foods with 300 mg of sodium or more per serving. Shopping  Look for lower-sodium products, often labeled as "low-sodium" or "no salt added."  Always check the sodium content even if foods are labeled as "unsalted" or "no salt added".  Buy fresh foods. ? Avoid canned foods and premade or frozen meals. ? Avoid canned, cured, or processed meats  Buy breads that have less than 80 mg of sodium per slice. Cooking  Eat more home-cooked food and less restaurant, buffet, and fast food.  Avoid adding salt when cooking. Use salt-free seasonings or herbs instead of table salt or sea salt. Check with your health care provider or pharmacist before using salt substitutes.  Cook with plant-based oils,  such as canola, sunflower, or olive oil. Meal planning  When eating at a restaurant, ask that your food be prepared with less salt or no salt, if possible.  Avoid foods that contain MSG (monosodium glutamate). MSG is sometimes added to [place] food, bouillon, and some canned foods. What foods are recommended? The items listed may not be a complete list. Talk with your dietitian about what dietary choices are best for you. Grains Low-sodium cereals, including oats, puffed wheat and rice, and shredded wheat. Low-sodium crackers. Unsalted rice. Unsalted pasta. Low-sodium bread. Whole-grain breads and whole-grain pasta. Vegetables Fresh or frozen vegetables. "No salt added" canned vegetables. "No salt added" tomato sauce and paste. Low-sodium or reduced-sodium tomato and vegetable juice. Fruits Fresh, frozen, or canned fruit. Fruit juice. Meats and other protein foods Fresh or frozen (no salt added) meat, poultry, seafood, and fish. Low-sodium canned tuna and salmon. Unsalted nuts. Dried peas, beans, and lentils without added salt. Unsalted canned beans. Eggs. Unsalted nut butters. Dairy Milk. Soy milk. Cheese that is naturally low in sodium, such as ricotta cheese, fresh mozzarella, or Swiss cheese Low-sodium or reduced-sodium cheese. Cream cheese. Yogurt. Fats and oils Unsalted butter. Unsalted margarine with no trans fat. Vegetable oils such as canola or olive oils. Seasonings and other foods Fresh and dried herbs and spices. Salt-free seasonings. Low-sodium mustard and ketchup. Sodium-free salad dressing. Sodium-free light mayonnaise. Fresh or refrigerated horseradish. Lemon juice. Vinegar. Homemade, reduced-sodium, or low-sodium soups. Unsalted popcorn and pretzels. Low-salt or salt-free chips. What foods are not recommended?  The items listed may not be a complete list. Talk with your dietitian about what dietary choices are best for you. Grains Instant hot cereals. Bread stuffing, pancake,  and biscuit mixes. Croutons. Seasoned rice or pasta mixes. Noodle soup cups. Boxed or frozen macaroni and cheese. Regular salted crackers. Self-rising flour. Vegetables Sauerkraut, pickled vegetables, and relishes. Olives. [place] fries. Onion rings. Regular canned vegetables (not low-sodium or reduced-sodium). Regular canned tomato sauce and paste (not low-sodium or reduced-sodium). Regular tomato and vegetable juice (not low-sodium or reduced-sodium). Frozen vegetables in sauces. Meats and other protein foods Meat or fish that is salted, canned, smoked, spiced, or pickled. Bacon, ham, sausage, hotdogs, corned beef, chipped beef, packaged lunch meats, salt pork, jerky, pickled herring, anchovies, regular canned tuna, sardines, salted nuts. Dairy Processed cheese and cheese spreads. Cheese curds. Blue cheese. Feta cheese. String cheese. Regular cottage cheese. Buttermilk. Canned milk. Fats and oils Salted butter. Regular margarine. Ghee. Bacon fat. Seasonings and other foods Onion salt, garlic salt, seasoned salt, table salt, and sea salt. Canned and packaged gravies. Worcestershire sauce. Tartar sauce. Barbecue sauce. Teriyaki sauce. Soy sauce, including reduced-sodium. Steak sauce. Fish sauce. Oyster sauce. Cocktail sauce. Horseradish that you find on the shelf. Regular ketchup and mustard. Meat flavorings and tenderizers. Bouillon cubes. Hot sauce and Tabasco sauce. Premade or packaged marinades. Premade or packaged taco seasonings. Relishes. Regular salad dressings. Salsa. Potato and tortilla chips. Corn chips and puffs. Salted popcorn and pretzels. Canned or dried soups. Pizza. Frozen entrees and pot pies. Summary  Eating less sodium can help lower your blood pressure, reduce swelling, and protect your heart, liver, and kidneys.  Most people on this plan should limit their sodium intake to 1,500-2,000 mg (milligrams) of sodium each day.  Canned, boxed, and frozen foods are high in sodium.  Restaurant foods, fast foods, and pizza are also very high in sodium. You also get sodium by adding salt to food.  Try to cook at home, eat more fresh fruits and vegetables, and eat less fast food, canned, processed, or prepared foods. This information is not intended to replace advice given to you by your health care provider. Make sure you discuss any questions you have with your health care provider. Document Released: 03/29/2002 Document Revised: 09/30/2016 Document Reviewed: 09/30/2016 Elsevier Interactive Patient Education  2017 [employer].

## 2017-05-30 DIAGNOSIS — I959 Hypotension, unspecified: Secondary | ICD-10-CM | POA: Insufficient documentation

## 2017-06-24 ENCOUNTER — Telehealth: Payer: Self-pay | Admitting: Family

## 2017-06-24 ENCOUNTER — Ambulatory Visit: Payer: Medicaid Other | Admitting: Family

## 2017-06-24 NOTE — Telephone Encounter (Signed)
Patient did not show for his [facility] appointment on 06/24/17. Will attempt to reschedule.  

## 2017-07-01 ENCOUNTER — Encounter: Payer: Self-pay | Admitting: Family

## 2017-07-01 ENCOUNTER — Ambulatory Visit: Payer: Medicaid Other | Attending: Family | Admitting: Family

## 2017-07-01 VITALS — BP 112/73 | HR 87 | Resp 18 | Ht 67.0 in | Wt 171.5 lb

## 2017-07-01 DIAGNOSIS — I950 Idiopathic hypotension: Secondary | ICD-10-CM

## 2017-07-01 DIAGNOSIS — I110 Hypertensive heart disease with heart failure: Secondary | ICD-10-CM | POA: Diagnosis not present

## 2017-07-01 DIAGNOSIS — E1022 Type 1 diabetes mellitus with diabetic chronic kidney disease: Secondary | ICD-10-CM

## 2017-07-01 DIAGNOSIS — I5022 Chronic systolic (congestive) heart failure: Secondary | ICD-10-CM

## 2017-07-01 DIAGNOSIS — Z048 Encounter for examination and observation for other specified reasons: Secondary | ICD-10-CM | POA: Insufficient documentation

## 2017-07-01 DIAGNOSIS — E119 Type 2 diabetes mellitus without complications: Secondary | ICD-10-CM | POA: Insufficient documentation

## 2017-07-01 DIAGNOSIS — N183 Chronic kidney disease, stage 3 (moderate): Secondary | ICD-10-CM

## 2017-07-01 NOTE — Patient Instructions (Addendum)
Continue weighing daily and call for an overnight weight gain of > 2 pounds or a weekly weight gain of >5 pounds.  Begin taking carvedilol twice daily.   Take the furosemide (lasix) just if you need it for above weight gain, any swelling in your legs or any shortness of breath.

## 2017-07-01 NOTE — Progress Notes (Addendum)
Agree with pharmacist note below.  Physical exam and ROS done by myself.  Due to low BP, will decrease his furosemide to 20mg  PRN based on weight, edema or shortness of breath. Verbalized understanding of those instructions as well as the importance of taking his carvedilol twice daily.  Did drink 4- 12 ounce cans of beer yesterday as he was celebrating his birthday. Niece bought him a case but he only drank 4. Says that he's not planning on drinking anymore from the case.  Return here in 1 month or sooner for any questions/problems before then.  Clarisa Kindred, FNP HF Clinic at [facility]     Patient ID: Bankston Muzyka, male    DOB: 07-Aug-1956, 61 y.o.   MRN: [id number]  HPI  Mr Karwowski is a 61 y/o male with a history of depression, DM, hyperlipidemia, HTN, osteomyelitis, current alcohol use and chronic heart failure.   Echo report from 05/19/17 reviewed and shows an EF of 30% along with mild MR and normal pulmonary artery pressures.  Admitted 05/18/17 due to syncope thought to be due to alcohol intoxication. MRI/EEG done and were negative. Cardiology and neurology consults were obtained. Carotid dopplers negative. Elevated troponin thought to be due to demand ischemia. Discharged home after 2 days.  He presents today for a follow up visit and states he is doing well. Denies any edema but had an episode of slight chest pain yesterday but thinks due to gas. Denies any symptoms such as headache or dizziness. Patient presents with all medication bottles and a paper with daily weights and daily blood glucose levels recorded.   Past Medical History:  Diagnosis Date  . Alcohol abuse   . Congestive heart failure (HCC)   . Depression   . Diabetes (HCC)   . Hx MRSA infection 04/15/2014  . Hyperlipidemia   . Hypertension   . Neuropathy   . Osteomyelitis of toe (HCC)    3rd toe   Past Surgical History:  Procedure Laterality Date  . AMPUTATION OF REPLICATED TOES     No family history on  file. Social History  Substance Use Topics  . Smoking status: Never Smoker  . Smokeless tobacco: Former Neurosurgeon  . Alcohol use Yes     Comment: 2-3 40's a day   No Known Allergies Prior to Admission medications   Medication Sig Start Date End Date Taking? Authorizing Provider  aspirin EC 81 MG tablet Take 1 tablet (81 mg total) by mouth daily. 05/20/17  Yes Enid Baas, MD  atorvastatin (LIPITOR) 80 MG tablet Take 1 tablet (80 mg total) by mouth every evening. 05/20/17  Yes Enid Baas, MD  carvedilol (COREG) 6.25 MG tablet Take 1 tablet (6.25 mg total) by mouth 2 (two) times daily. 05/20/17  Yes Enid Baas, MD  citalopram (CELEXA) 20 MG tablet Take 1 tablet (20 mg total) by mouth daily. 05/20/17  Yes Enid Baas, MD  furosemide (LASIX) 20 MG tablet Take 1 tablet (20 mg total) by mouth 2 (two) times daily. 05/20/17  Yes Enid Baas, MD  gabapentin (NEURONTIN) 300 MG capsule Take 900 mg by mouth at bedtime.   Yes [provider]  insulin glargine (LANTUS) 100 UNIT/ML injection Inject 0.27 mLs (27 Units total) into the skin at bedtime. 05/20/17  Yes Enid Baas, MD  sacubitril-valsartan (ENTRESTO) 24-26 MG Take 1 tablet by mouth 2 (two) times daily. 05/21/17  Yes Enid Baas, MD  tamsulosin (FLOMAX) 0.4 MG CAPS capsule Take 1 capsule (0.4 mg total) by mouth daily.  05/20/17  Yes Enid Baas, MD   Review of Systems  Constitutional: Negative for appetite change and fatigue.  HENT: Negative for congestion, rhinorrhea and sore throat.   Eyes: Negative.   Respiratory: Negative for chest tightness and shortness of breath.   Cardiovascular: Positive for chest pain ("yesterday but thought it was gas"). Negative for palpitations and leg swelling.  Gastrointestinal: Negative for abdominal distention and abdominal pain.  Endocrine: Negative.   Genitourinary: Negative.   Musculoskeletal: Negative for back pain and neck pain.  Skin: Negative.    Allergic/Immunologic: Negative.   Neurological: Negative for dizziness and light-headedness.  Hematological: Negative for adenopathy. Does not bruise/bleed easily.  Psychiatric/Behavioral: Negative for dysphoric mood and sleep disturbance (sleeping on 2 pillows). The patient is not nervous/anxious.    Vitals:   07/01/17 1131  BP: 112/73  Pulse: 87  Resp: 18  SpO2: 100%  Weight: 171 lb 8 oz (77.8 kg)  Height:  (1.702 m)   Wt Readings from Last 3 Encounters:  07/01/17 171 lb 8 oz (77.8 kg)  05/29/17 171 lb 6 oz (77.7 kg)  05/18/17 165 lb 6.4 oz (75 kg)   Lab Results  Component Value Date   CREATININE 1.90 (H) 05/20/2017   CREATININE 2.21 (H) 05/18/2017   CREATININE 2.44 (H) 05/08/2016    Physical Exam  Constitutional: He is oriented to person, place, and time. He appears well-developed and well-nourished.  HENT:  Head: Normocephalic and atraumatic.  Neck: Normal range of motion. Neck supple. No JVD present.  Cardiovascular: Normal rate and regular rhythm.   Pulmonary/Chest: Effort normal and breath sounds normal.  Abdominal: Soft. He exhibits no distension. There is no tenderness.  Musculoskeletal: He exhibits no edema or tenderness.  Neurological: He is alert and oriented to person, place, and time.  Skin: Skin is warm and dry.  Psychiatric: He has a normal mood and affect. His behavior is normal. Thought content normal.  Nursing note and vitals reviewed.  Assessment & Plan: Patient endorses not taking any of his AM medications before appointment today.   1: Chronic heart failure with reduced ejection fraction-  - NYHA class I - euvolemic today - Exercising 20-30 min, 2-3 days/week  - Weighing daily and instructed to call for an overnight weight gain of >2 pounds or a weekly weight gain of >5 pounds - Reviewed fluid intake; No more than 40-50 ounces/day  - [facility]   2: Hypotension- - BP low but patient denies dizziness - NP  changed furosemide  to PRN based on increase in weight, edema and/or SOB  - Patient was only taking Coreg once a day; Counseled on taking Coreg two times a day, as written on the medication bottle  - consider titrating up entresto if able  3: Diabetes- - Blood glucose 149  - Patient checks BG 4 x day and records on sheet of paper; Per recorded BG, patient had 2-3 lows (<80) within last ~4 weeks; Patient endorses feeling sweaty, dizzy and nauseas when having a low; Reviewed with patient what to do when experiencing a low BG  - continues on Lantus qhs  Patient brought all medication bottles and a sheet with recorded daily weights and BG levels from the last ~4 weeks. Each medication was verbally reviewed with the patient.   Return in 1 month or sooner for any questions/problems before then.   Cleopatra Cedar, PharmD  Pharmacy Resident  07/01/17

## 2017-07-29 ENCOUNTER — Telehealth: Payer: Self-pay | Admitting: Family

## 2017-07-29 ENCOUNTER — Ambulatory Visit: Payer: Medicaid Other | Admitting: Family

## 2017-07-29 NOTE — Telephone Encounter (Signed)
Patient did not show for his [facility] Failure Clinic appointment on 07/29/17. Will attempt to reschedule.   This is the 4th appointment that he has missed.

## 2017-08-23 ENCOUNTER — Inpatient Hospital Stay
Admission: EM | Admit: 2017-08-23 | Discharge: 2017-08-25 | DRG: 071 | Disposition: A | Discharge status: Home Health | Payer: Medicaid Other | Attending: Internal Medicine | Admitting: Internal Medicine

## 2017-08-23 ENCOUNTER — Encounter: Payer: Self-pay | Admitting: Internal Medicine

## 2017-08-23 ENCOUNTER — Emergency Department: Payer: Medicaid Other

## 2017-08-23 DIAGNOSIS — E1122 Type 2 diabetes mellitus with diabetic chronic kidney disease: Secondary | ICD-10-CM | POA: Diagnosis present

## 2017-08-23 DIAGNOSIS — Z794 Long term (current) use of insulin: Secondary | ICD-10-CM

## 2017-08-23 DIAGNOSIS — G9340 Encephalopathy, unspecified: Secondary | ICD-10-CM

## 2017-08-23 DIAGNOSIS — R748 Abnormal levels of other serum enzymes: Secondary | ICD-10-CM

## 2017-08-23 DIAGNOSIS — I130 Hypertensive heart and chronic kidney disease with heart failure and stage 1 through stage 4 chronic kidney disease, or unspecified chronic kidney disease: Secondary | ICD-10-CM | POA: Diagnosis present

## 2017-08-23 DIAGNOSIS — T68XXXA Hypothermia, initial encounter: Secondary | ICD-10-CM

## 2017-08-23 DIAGNOSIS — E11649 Type 2 diabetes mellitus with hypoglycemia without coma: Secondary | ICD-10-CM | POA: Diagnosis present

## 2017-08-23 DIAGNOSIS — Z7982 Long term (current) use of aspirin: Secondary | ICD-10-CM | POA: Diagnosis not present

## 2017-08-23 DIAGNOSIS — I5022 Chronic systolic (congestive) heart failure: Secondary | ICD-10-CM | POA: Diagnosis present

## 2017-08-23 DIAGNOSIS — Z87891 Personal history of nicotine dependence: Secondary | ICD-10-CM

## 2017-08-23 DIAGNOSIS — E785 Hyperlipidemia, unspecified: Secondary | ICD-10-CM | POA: Diagnosis present

## 2017-08-23 DIAGNOSIS — G9341 Metabolic encephalopathy: Principal | ICD-10-CM

## 2017-08-23 DIAGNOSIS — Z23 Encounter for immunization: Secondary | ICD-10-CM

## 2017-08-23 DIAGNOSIS — E872 Acidosis, unspecified: Secondary | ICD-10-CM

## 2017-08-23 DIAGNOSIS — F1010 Alcohol abuse, uncomplicated: Secondary | ICD-10-CM | POA: Diagnosis present

## 2017-08-23 DIAGNOSIS — N183 Chronic kidney disease, stage 3 (moderate): Secondary | ICD-10-CM | POA: Diagnosis present

## 2017-08-23 DIAGNOSIS — R680 Hypothermia, not associated with low environmental temperature: Secondary | ICD-10-CM | POA: Diagnosis present

## 2017-08-23 DIAGNOSIS — R6510 Systemic inflammatory response syndrome (SIRS) of non-infectious origin without acute organ dysfunction: Secondary | ICD-10-CM

## 2017-08-23 DIAGNOSIS — R4182 Altered mental status, unspecified: Secondary | ICD-10-CM | POA: Diagnosis not present

## 2017-08-23 LAB — URINALYSIS, COMPLETE (UACMP) WITH MICROSCOPIC
BACTERIA UA: NONE SEEN
Bilirubin Urine: NEGATIVE
Glucose, UA: 50 mg/dL — AB
Ketones, ur: NEGATIVE mg/dL
Leukocytes, UA: NEGATIVE
Nitrite: NEGATIVE
PROTEIN: 100 mg/dL — AB
SPECIFIC GRAVITY, URINE: 1.009 (ref 1.005–1.030)
SQUAMOUS EPITHELIAL / LPF: NONE SEEN
pH: 5 (ref 5.0–8.0)

## 2017-08-23 LAB — LACTIC ACID, PLASMA
LACTIC ACID, VENOUS: 3 mmol/L — AB (ref 0.5–1.9)
Lactic Acid, Venous: 2.3 mmol/L (ref 0.5–1.9)

## 2017-08-23 LAB — CBC WITH DIFFERENTIAL/PLATELET
BASOS ABS: 0 10*3/uL (ref 0–0.1)
Basophils Relative: 1 %
EOS PCT: 1 %
Eosinophils Absolute: 0 10*3/uL (ref 0–0.7)
HCT: 47.1 % (ref 40.0–52.0)
Hemoglobin: 15.5 g/dL (ref 13.0–18.0)
LYMPHS ABS: 1.3 10*3/uL (ref 1.0–3.6)
Lymphocytes Relative: 44 %
MCH: 30.8 pg (ref 26.0–34.0)
MCHC: 32.9 g/dL (ref 32.0–36.0)
MCV: 93.8 fL (ref 80.0–100.0)
Monocytes Absolute: 0.4 10*3/uL (ref 0.2–1.0)
Monocytes Relative: 12 %
Neutro Abs: 1.3 10*3/uL — ABNORMAL LOW (ref 1.4–6.5)
Neutrophils Relative %: 42 %
Platelets: 195 10*3/uL (ref 150–440)
RBC: 5.02 MIL/uL (ref 4.40–5.90)
RDW: 13.3 % (ref 11.5–14.5)
WBC: 3 10*3/uL — AB (ref 3.8–10.6)

## 2017-08-23 LAB — URINE DRUG SCREEN, QUALITATIVE (ARMC ONLY)
Amphetamines, Ur Screen: NOT DETECTED
Barbiturates, Ur Screen: NOT DETECTED
Benzodiazepine, Ur Scrn: NOT DETECTED
CANNABINOID 50 NG, UR ~~LOC~~: NOT DETECTED
COCAINE METABOLITE, UR ~~LOC~~: POSITIVE — AB
MDMA (ECSTASY) UR SCREEN: NOT DETECTED
Methadone Scn, Ur: NOT DETECTED
Opiate, Ur Screen: NOT DETECTED
PHENCYCLIDINE (PCP) UR S: NOT DETECTED
Tricyclic, Ur Screen: NOT DETECTED

## 2017-08-23 LAB — BASIC METABOLIC PANEL
Anion gap: 14 (ref 5–15)
BUN: 27 mg/dL — AB (ref 6–20)
CO2: 18 mmol/L — ABNORMAL LOW (ref 22–32)
Calcium: 8.9 mg/dL (ref 8.9–10.3)
Chloride: 101 mmol/L (ref 101–111)
Creatinine, Ser: 1.84 mg/dL — ABNORMAL HIGH (ref 0.61–1.24)
GFR calc Af Amer: 43 mL/min — ABNORMAL LOW (ref >60)
GFR, EST NON AFRICAN AMERICAN: 37 mL/min — AB (ref >60)
GLUCOSE: 82 mg/dL (ref 65–99)
POTASSIUM: 5.1 mmol/L (ref 3.5–5.1)
SODIUM: 133 mmol/L — AB (ref 135–145)

## 2017-08-23 LAB — GLUCOSE, CAPILLARY
GLUCOSE-CAPILLARY: 86 mg/dL (ref 65–99)
GLUCOSE-CAPILLARY: 99 mg/dL (ref 65–99)
GLUCOSE-CAPILLARY: 202 mg/dL — AB (ref 65–99)

## 2017-08-23 LAB — INFLUENZA PANEL BY PCR (TYPE A & B)
INFLBPCR: NEGATIVE
Influenza A By PCR: NEGATIVE

## 2017-08-23 LAB — CK: Total CK: 947 U/L — ABNORMAL HIGH (ref 49–397)

## 2017-08-23 LAB — ETHANOL: Alcohol, Ethyl (B): 96 mg/dL — ABNORMAL HIGH (ref <10)

## 2017-08-23 MED ORDER — ASPIRIN EC 81 MG PO TBEC
81.0000 mg | DELAYED_RELEASE_TABLET | Freq: Every day | ORAL | Status: DC
  Administered 2017-08-23 – 2017-08-25 (×3): 81 mg via ORAL
  Filled 2017-08-23 (×3): qty 1

## 2017-08-23 MED ORDER — PANTOPRAZOLE SODIUM 40 MG IV SOLR
40.0000 mg | Freq: Two times a day (BID) | INTRAVENOUS | Status: DC
  Administered 2017-08-23 – 2017-08-24 (×2): 40 mg via INTRAVENOUS
  Filled 2017-08-23 (×3): qty 40

## 2017-08-23 MED ORDER — HEPARIN SODIUM (PORCINE) 5000 UNIT/ML IJ SOLN
5000.0000 [IU] | Freq: Three times a day (TID) | INTRAMUSCULAR | Status: DC
  Administered 2017-08-23 – 2017-08-25 (×6): 5000 [IU] via SUBCUTANEOUS
  Filled 2017-08-23 (×6): qty 1

## 2017-08-23 MED ORDER — ACETAMINOPHEN 325 MG PO TABS
650.0000 mg | ORAL_TABLET | Freq: Four times a day (QID) | ORAL | Status: DC | PRN
  Filled 2017-08-23: qty 2

## 2017-08-23 MED ORDER — SODIUM CHLORIDE 0.9 % IV SOLN
INTRAVENOUS | Status: DC
  Administered 2017-08-23: 17:00:00 via INTRAVENOUS

## 2017-08-23 MED ORDER — CARVEDILOL 3.125 MG PO TABS
6.2500 mg | ORAL_TABLET | Freq: Two times a day (BID) | ORAL | Status: DC
  Administered 2017-08-23 – 2017-08-25 (×4): 6.25 mg via ORAL
  Filled 2017-08-23 (×4): qty 2

## 2017-08-23 MED ORDER — LORAZEPAM 2 MG/ML IJ SOLN: 0.5000 mg | INTRAMUSCULAR | Status: DC | PRN

## 2017-08-23 MED ORDER — VANCOMYCIN HCL IN DEXTROSE 1-5 GM/200ML-% IV SOLN
1000.0000 mg | Freq: Once | INTRAVENOUS | Status: AC
  Administered 2017-08-23: 1000 mg via INTRAVENOUS
  Filled 2017-08-23: qty 200

## 2017-08-23 MED ORDER — DOCUSATE SODIUM 100 MG PO CAPS
100.0000 mg | ORAL_CAPSULE | Freq: Two times a day (BID) | ORAL | Status: DC
  Administered 2017-08-23 – 2017-08-25 (×5): 100 mg via ORAL
  Filled 2017-08-23 (×5): qty 1

## 2017-08-23 MED ORDER — PIPERACILLIN-TAZOBACTAM 3.375 G IVPB 30 MIN
3.3750 g | Freq: Once | INTRAVENOUS | Status: AC
Start: 2017-08-23 — End: 2017-08-23
  Administered 2017-08-23: 3.375 g via INTRAVENOUS
  Filled 2017-08-23: qty 50

## 2017-08-23 MED ORDER — PNEUMOCOCCAL VAC POLYVALENT 25 MCG/0.5ML IJ INJ
0.5000 mL | INJECTION | INTRAMUSCULAR | Status: AC
  Administered 2017-08-24: 0.5 mL via INTRAMUSCULAR
  Filled 2017-08-23: qty 0.5

## 2017-08-23 MED ORDER — GABAPENTIN 300 MG PO CAPS
600.0000 mg | ORAL_CAPSULE | Freq: Every day | ORAL | Status: DC
  Administered 2017-08-23 – 2017-08-24 (×2): 600 mg via ORAL
  Filled 2017-08-23 (×2): qty 2

## 2017-08-23 MED ORDER — TAMSULOSIN HCL 0.4 MG PO CAPS
0.4000 mg | ORAL_CAPSULE | Freq: Every day | ORAL | Status: DC
  Administered 2017-08-23 – 2017-08-25 (×3): 0.4 mg via ORAL
  Filled 2017-08-23 (×3): qty 1

## 2017-08-23 MED ORDER — CITALOPRAM HYDROBROMIDE 20 MG PO TABS
20.0000 mg | ORAL_TABLET | Freq: Every day | ORAL | Status: DC
  Administered 2017-08-23 – 2017-08-25 (×3): 20 mg via ORAL
  Filled 2017-08-23 (×3): qty 1

## 2017-08-23 MED ORDER — INSULIN GLARGINE 100 UNIT/ML ~~LOC~~ SOLN
20.0000 [IU] | Freq: Every day | SUBCUTANEOUS | Status: DC
  Administered 2017-08-23: 20 [IU] via SUBCUTANEOUS
  Filled 2017-08-23 (×2): qty 0.2

## 2017-08-23 MED ORDER — VANCOMYCIN HCL IN DEXTROSE 1-5 GM/200ML-% IV SOLN
1000.0000 mg | INTRAVENOUS | Status: DC
  Administered 2017-08-23: 1000 mg via INTRAVENOUS
  Filled 2017-08-23 (×2): qty 200

## 2017-08-23 MED ORDER — ONDANSETRON HCL 4 MG/2ML IJ SOLN: 4.0000 mg | Freq: Four times a day (QID) | INTRAMUSCULAR | Status: DC | PRN

## 2017-08-23 MED ORDER — PIPERACILLIN-TAZOBACTAM 3.375 G IVPB
3.3750 g | Freq: Three times a day (TID) | INTRAVENOUS | Status: DC
  Administered 2017-08-23 – 2017-08-24 (×2): 3.375 g via INTRAVENOUS
  Filled 2017-08-23 (×2): qty 50

## 2017-08-23 MED ORDER — INSULIN ASPART 100 UNIT/ML ~~LOC~~ SOLN: 0.0000 [IU] | Freq: Three times a day (TID) | SUBCUTANEOUS | Status: DC

## 2017-08-23 MED ORDER — SACUBITRIL-VALSARTAN 24-26 MG PO TABS
1.0000 | ORAL_TABLET | Freq: Two times a day (BID) | ORAL | Status: DC
  Administered 2017-08-23 – 2017-08-25 (×4): 1 via ORAL
  Filled 2017-08-23 (×7): qty 1

## 2017-08-23 MED ORDER — BISACODYL 10 MG RE SUPP: 10.0000 mg | Freq: Every day | RECTAL | Status: DC | PRN

## 2017-08-23 MED ORDER — ONDANSETRON HCL 4 MG PO TABS: 4.0000 mg | ORAL_TABLET | Freq: Four times a day (QID) | ORAL | Status: DC | PRN

## 2017-08-23 MED ORDER — SODIUM CHLORIDE 0.9 % IV BOLUS (SEPSIS)
1000.0000 mL | Freq: Once | INTRAVENOUS | Status: AC
  Administered 2017-08-23: 1000 mL via INTRAVENOUS

## 2017-08-23 MED ORDER — ACETAMINOPHEN 650 MG RE SUPP: 650.0000 mg | Freq: Four times a day (QID) | RECTAL | Status: DC | PRN

## 2017-08-23 MED ORDER — ATORVASTATIN CALCIUM 20 MG PO TABS
80.0000 mg | ORAL_TABLET | Freq: Every evening | ORAL | Status: DC
  Administered 2017-08-23 – 2017-08-24 (×2): 80 mg via ORAL
  Filled 2017-08-23 (×2): qty 4

## 2017-08-23 MED ORDER — HYDRALAZINE HCL 20 MG/ML IJ SOLN: 10.0000 mg | INTRAMUSCULAR | Status: DC | PRN

## 2017-08-23 NOTE — Progress Notes (Signed)
Called and told nurse supervisor we needed her to try to get IV access. Nurse had 2 unsuccessful attempts. She will try to come but busy at this time.

## 2017-08-23 NOTE — ED Triage Notes (Signed)
Pt presents via EMS with altered mental status and hypoglycemia og 40 per EMS. Given D50, Glucagon, and 1mg  Narcan PTA. No improvement in mental status.

## 2017-08-23 NOTE — H&P (Signed)
History and Physical    Maurice ChaJackie Kyer RUE:[id number] DOB: 1956-04-08 DOA: 08/23/2017  Referring physician: Dr. Roxan Hockeyobinson PCP: Theodosia Blender'Connell, Brendon M, MD  Specialists: none  Chief Complaint: unresponsiveness  HPI: Maurice Jordan is a 61 y.o. male has a past medical history significant for ETOH abuse, DM, HTN, and CHF found by neighbors down and unresponsive for an unknown period of time. Was brought to ER by EMS where he was noted to be hypothermic and hypoglycemic. He is very lethargic but will mumble. His lactic acid is elevated but his CXR is clear. He is tachycardic and hypertensive. He is now admitted.  Review of Systems: unable to obtain from patient due to lethargy and non-verbal status  Past Medical History:  Diagnosis Date  . Alcohol abuse   . Congestive heart failure (HCC)   . Depression   . Diabetes (HCC)   . Hx MRSA infection 04/15/2014  . Hyperlipidemia   . Hypertension   . Neuropathy   . Osteomyelitis of toe (HCC)    3rd toe   Past Surgical History:  Procedure Laterality Date  . AMPUTATION OF REPLICATED TOES     Social History:  reports that he has never smoked. He has quit using smokeless tobacco. He reports that he drinks alcohol. He reports that he does not use drugs.  No Known Allergies  History reviewed. No pertinent family history.  Prior to Admission medications   Medication Sig Start Date End Date Taking? Authorizing Provider  aspirin EC 81 MG tablet Take 1 tablet (81 mg total) by mouth daily. 05/20/17   Enid BaasKalisetti, Radhika, MD  atorvastatin (LIPITOR) 80 MG tablet Take 1 tablet (80 mg total) by mouth every evening. 05/20/17   Enid BaasKalisetti, Radhika, MD  carvedilol (COREG) 6.25 MG tablet Take 1 tablet (6.25 mg total) by mouth 2 (two) times daily. 05/20/17   Enid BaasKalisetti, Radhika, MD  citalopram (CELEXA) 20 MG tablet Take 1 tablet (20 mg total) by mouth daily. 05/20/17   Enid BaasKalisetti, Radhika, MD  furosemide (LASIX) 20 MG tablet Take 20 mg by mouth as needed for fluid  or edema.    [provider]  gabapentin (NEURONTIN) 300 MG capsule Take 900 mg by mouth at bedtime.    [provider]  insulin glargine (LANTUS) 100 UNIT/ML injection Inject 0.27 mLs (27 Units total) into the skin at bedtime. 05/20/17   Enid BaasKalisetti, Radhika, MD  sacubitril-valsartan (ENTRESTO) 24-26 MG Take 1 tablet by mouth 2 (two) times daily. 05/21/17   Enid BaasKalisetti, Radhika, MD  tamsulosin (FLOMAX) 0.4 MG CAPS capsule Take 1 capsule (0.4 mg total) by mouth daily. 05/20/17   Enid BaasKalisetti, Radhika, MD   Physical Exam: Vitals:   08/23/17 1127 08/23/17 1230 08/23/17 1258  BP: (!) 169/117 (!) 146/126   Pulse: 80    Resp: 14    Temp: (!) 91.8 F (33.2 C)  97.6 F (36.4 C)  TempSrc: Rectal  Oral  SpO2: 99%       General:  WDWN, [place]/AT, in NAD  Eyes: PERRL, EOMI, no scleral icterus, conjunctiva clear  ENT: moist oropharynx without exudate, TM's benign, dentition fair  Neck: supple, no lymphadenopathy. No bruits or thyromegaly  Cardiovascular: regular rate without MRG; 2+ peripheral pulses, no JVD, no peripheral edema  Respiratory: CTA biL, good air movement without wheezing, rhonchi or crackled. Respiratory effort normal  Abdomen: soft, non tender to palpation, positive bowel sounds, no guarding, no rebound  Skin: no rashes or lesions  Musculoskeletal: normal bulk and tone, no joint swelling  Psychiatric:  somnolent, responds to pain  Neurologic: CN 2-12 grossly intact, Motor strength 5/5 in all 4 groups with symmetric DTR's and non-focal sensory exam  Labs on Admission:  Basic Metabolic Panel:  Recent Labs Lab 08/23/17 1109  NA 133*  K 5.1  CL 101  CO2 18*  GLUCOSE 82  BUN 27*  CREATININE 1.84*  CALCIUM 8.9   Liver Function Tests: No results for input(s): AST, ALT, ALKPHOS, BILITOT, PROT, ALBUMIN in the last 168 hours. No results for input(s): LIPASE, AMYLASE in the last 168 hours. No results for input(s): AMMONIA in the last 168 hours. CBC:  Recent  Labs Lab 08/23/17 1109  WBC 3.0*  NEUTROABS 1.3*  HGB 15.5  HCT 47.1  MCV 93.8  PLT 195   Cardiac Enzymes:  Recent Labs Lab 08/23/17 1109  CKTOTAL 947*    BNP (last 3 results) No results for input(s): BNP in the last 8760 hours.  ProBNP (last 3 results) No results for input(s): PROBNP in the last 8760 hours.  CBG:  Recent Labs Lab 08/23/17 1124  GLUCAP 86    Radiological Exams on Admission: Ct Head Wo Contrast  Result Date: 08/23/2017 CLINICAL DATA:  61 year old male with a history of altered mental status EXAM: CT HEAD WITHOUT CONTRAST TECHNIQUE: Contiguous axial images were obtained from the base of the skull through the vertex without intravenous contrast. COMPARISON:  None. FINDINGS: Brain: No acute intracranial hemorrhage. No midline shift or mass effect. Gray-white differentiation maintained. Unremarkable appearance of the ventricular system. Vascular: Calcifications of anterior circulation Skull: No acute fracture.  No aggressive bone lesion identified. Sinuses/Orbits: Unremarkable appearance of the orbits. Mastoid air cells clear. No middle ear effusion. No significant sinus disease. Other: None IMPRESSION: No CT evidence of acute intracranial abnormality. Electronically Signed   By: Gilmer Mor D.O.   On: 08/23/2017 12:09   Dg Chest Portable 1 View  Result Date: 08/23/2017 CLINICAL DATA:  Found unresponsive. History of drug and alcohol abuse. Hypertension. EXAM: PORTABLE CHEST 1 VIEW COMPARISON:  05/18/2017. FINDINGS: The cardiac silhouette remains borderline enlarged. Clear lungs with normal vascularity. Thoracic spine degenerative changes. IMPRESSION: No acute abnormality. Electronically Signed   By: Beckie Salts M.D.   On: 08/23/2017 11:27    EKG: Independently reviewed.  Assessment/Plan Principal Problem:   SIRS (systemic inflammatory response syndrome) (HCC) Active Problems:   Alcohol abuse   Acute encephalopathy   Hypothermia   Will admit to floor  with warming blanket, IV fluids and empiric IV ABX. Consult Neuro if not improving. CT head OK. UA pending. CIWA protocol. Follow sugars. Consult ST, PT, and CM  Diet: soft Fluids: NS@100  DVT Prophylaxis: SQ Heparin  Code Status: FULL  Family Communication: none  Disposition Plan: home  Time spent: 50 min

## 2017-08-23 NOTE — ED Notes (Signed)
Bare hugger applied

## 2017-08-23 NOTE — ED Notes (Signed)
Admitting Provider at bedside. 

## 2017-08-23 NOTE — Progress Notes (Signed)
  Pharmacy Antibiotic Note  Maurice Jordan is a 61 y.o. male admitted on 08/23/2017 with sepsis.  Pharmacy has been consulted for zosyn and vancomycin dosing.  Plan: Vancomycin 1000 IV every 24 hours.  Goal trough 15-20 mcg/mL. Zosyn 3.375g IV q8h (4 hour infusion). Vancomycin trough 11/6@1930  before 4th dose   Height: 5\' 6"  (167.6 cm) Weight: 179 lb 14.4 oz (81.6 kg) IBW/kg (Calculated) : 63.8  Temp (24hrs), Avg:95.7 F (35.4 C), Min:91.8 F (33.2 C), Max:97.8 F (36.6 C)   Recent Labs Lab 08/23/17 1109 08/23/17 1220 08/23/17 1525  WBC 3.0*  --   --   CREATININE 1.84*  --   --   LATICACIDVEN  --  3.0* 2.3*    Estimated Creatinine Clearance: 41.2 mL/min (A) (by C-G formula based on SCr of 1.84 mg/dL (H)).    No Known Allergies  Antimicrobials this admission: Anti-infectives    Start     Dose/Rate Route Frequency Ordered Stop   08/23/17 2200  piperacillin-tazobactam (ZOSYN) IVPB 3.375 g     3.375 g 12.5 mL/hr over 240 Minutes Intravenous Every 8 hours 08/23/17 1822     08/23/17 2000  vancomycin (VANCOCIN) IVPB 1000 mg/200 mL premix     1,000 mg 200 mL/hr over 60 Minutes Intravenous Every 24 hours 08/23/17 1823     08/23/17 1330  piperacillin-tazobactam (ZOSYN) IVPB 3.375 g     3.375 g 100 mL/hr over 30 Minutes Intravenous  Once 08/23/17 1324 08/23/17 1429   08/23/17 1330  vancomycin (VANCOCIN) IVPB 1000 mg/200 mL premix     1,000 mg 200 mL/hr over 60 Minutes Intravenous  Once 08/23/17 1324 08/23/17 1525     Microbiology results: No results found for this or any previous visit (from the past 240 hour(s)).   Thank you for allowing pharmacy to be a part of this patient's care.  Gerre Pebbles Brandon Scarbrough 08/23/2017 6:24 PM

## 2017-08-23 NOTE — ED Provider Notes (Signed)
[facility] Emergency Department Provider Note    First MD Initiated Contact with Patient 08/23/17 1133     (approximate)  I have reviewed the triage vital signs and the nursing notes.   HISTORY  Chief Complaint Hypoglycemia and Altered Mental Status  Level V Caveat:  Acute encephalopathy - presumed metabolic  HPI Maurice Jordan is a 61 y.o. male arrives by EMS with acute encephalopathy reported found down at home.  Very limited history obtained from patient as he is drowsy and acutely encephalopathic but is currently protecting his airway.  According to EMS the patient was down at home without heat and was found to be hypothermic and hypoglycemic to the low 40s.  He was given glucagon D50.  They noted his pupils were pinpoint so they gave him Narcan without any improvement.  Patient does have a long history of substance abuse, congestive heart failure.  Past Medical History:  Diagnosis Date  . Alcohol abuse   . Congestive heart failure (HCC)   . Depression   . Diabetes (HCC)   . Hx MRSA infection 04/15/2014  . Hyperlipidemia   . Hypertension   . Neuropathy   . Osteomyelitis of toe (HCC)    3rd toe   No family history on file. Past Surgical History:  Procedure Laterality Date  . AMPUTATION OF REPLICATED TOES     Patient Active Problem List   Diagnosis Date Noted  . Hypotension 05/30/2017  . Syncope 05/18/2017  . Hyponatremia 03/11/2015  . Chronic systolic congestive heart failure (HCC) 03/11/2015  . Polysubstance abuse (HCC) 03/11/2015  . Alcohol abuse 03/11/2015  . Hypertension 03/11/2015  . Diabetes mellitus (HCC) 03/11/2015  . Altered mental status 03/11/2015  . Rhabdomyolysis 03/11/2015  . Acute renal failure superimposed on stage 3 chronic kidney disease (HCC) 03/11/2015      Prior to Admission medications   Medication Sig Start Date End Date Taking? Authorizing Provider  aspirin EC 81 MG tablet Take 1 tablet (81 mg total) by  mouth daily. 05/20/17   Enid BaasKalisetti, Radhika, MD  atorvastatin (LIPITOR) 80 MG tablet Take 1 tablet (80 mg total) by mouth every evening. 05/20/17   Enid BaasKalisetti, Radhika, MD  carvedilol (COREG) 6.25 MG tablet Take 1 tablet (6.25 mg total) by mouth 2 (two) times daily. 05/20/17   Enid BaasKalisetti, Radhika, MD  citalopram (CELEXA) 20 MG tablet Take 1 tablet (20 mg total) by mouth daily. 05/20/17   Enid BaasKalisetti, Radhika, MD  furosemide (LASIX) 20 MG tablet Take 20 mg by mouth as needed for fluid or edema.    [provider]  gabapentin (NEURONTIN) 300 MG capsule Take 900 mg by mouth at bedtime.    [provider]  insulin glargine (LANTUS) 100 UNIT/ML injection Inject 0.27 mLs (27 Units total) into the skin at bedtime. 05/20/17   Enid BaasKalisetti, Radhika, MD  sacubitril-valsartan (ENTRESTO) 24-26 MG Take 1 tablet by mouth 2 (two) times daily. 05/21/17   Enid BaasKalisetti, Radhika, MD  tamsulosin (FLOMAX) 0.4 MG CAPS capsule Take 1 capsule (0.4 mg total) by mouth daily. 05/20/17   Enid BaasKalisetti, Radhika, MD    Allergies Patient has no known allergies.    Social History Social History  Substance Use Topics  . Smoking status: Never Smoker  . Smokeless tobacco: Former NeurosurgeonUser  . Alcohol use Yes     Comment: 2-3 40's a day    Review of Systems Unable to assess 2/2 acute encephalopathy ____________________________________________   PHYSICAL EXAM:  VITAL SIGNS: Vitals:   08/23/17 1127  BP: (!) 169/117  Pulse: 80  Resp: 14  SpO2: 99%    Constitutional: Acutely encephalopathic, protecting his airway but critically ill-appearing. Eyes: Conjunctivae are normal.  Pupils 69mm and reactive Head: Atraumatic. Nose: No congestion/rhinnorhea. Mouth/Throat: Mucous membranes are moist.   Neck: No stridor. Painless ROM.  Cardiovascular: Normal rate, regular rhythm. Grossly normal heart sounds.  Cold and mottled appearing Respiratory: Normal respiratory effort.  No retractions. Lungs CTAB. Gastrointestinal: Soft and  nontender. No distention. No abdominal bruits. No CVA tenderness. Musculoskeletal: No lower extremity tenderness nor edema.  No joint effusions. Neurologic: unable to assess 2/2 acute encephalopathy Skin:  Skin is warm, dry and intact. No rash noted. Psychiatric: unable to assess ____________________________________________   LABS (all labs ordered are listed, but only abnormal results are displayed)  Results for orders placed or performed during the hospital encounter of 08/23/17 (from the past 24 hour(s))  CBC with Differential/Platelet     Status: Abnormal   Collection Time: 08/23/17 11:09 AM  Result Value Ref Range   WBC 3.0 (L) 3.8 - 10.6 K/uL   RBC 5.02 4.40 - 5.90 MIL/uL   Hemoglobin 15.5 13.0 - 18.0 g/dL   HCT 50.9 32.6 - 71.2 %   MCV 93.8 80.0 - 100.0 fL   MCH 30.8 26.0 - 34.0 pg   MCHC 32.9 32.0 - 36.0 g/dL   RDW 45.8 09.9 - 83.3 %   Platelets 195 150 - 440 K/uL   Neutrophils Relative % 42 %   Neutro Abs 1.3 (L) 1.4 - 6.5 K/uL   Lymphocytes Relative 44 %   Lymphs Abs 1.3 1.0 - 3.6 K/uL   Monocytes Relative 12 %   Monocytes Absolute 0.4 0.2 - 1.0 K/uL   Eosinophils Relative 1 %   Eosinophils Absolute 0.0 0 - 0.7 K/uL   Basophils Relative 1 %   Basophils Absolute 0.0 0 - 0.1 K/uL  Basic metabolic panel     Status: Abnormal   Collection Time: 08/23/17 11:09 AM  Result Value Ref Range   Sodium 133 (L) 135 - 145 mmol/L   Potassium 5.1 3.5 - 5.1 mmol/L   Chloride 101 101 - 111 mmol/L   CO2 18 (L) 22 - 32 mmol/L   Glucose, Bld 82 65 - 99 mg/dL   BUN 27 (H) 6 - 20 mg/dL   Creatinine, Ser 8.25 (H) 0.61 - 1.24 mg/dL   Calcium 8.9 8.9 - 05.3 mg/dL   GFR calc non Af Amer 37 (L) >60 mL/min   GFR calc Af Amer 43 (L) >60 mL/min   Anion gap 14 5 - 15  Ethanol     Status: Abnormal   Collection Time: 08/23/17 11:09 AM  Result Value Ref Range   Alcohol, Ethyl (B) 96 (H) <10 mg/dL  Glucose, capillary     Status: None   Collection Time: 08/23/17 11:24 AM  Result Value Ref  Range   Glucose-Capillary 86 65 - 99 mg/dL   ____________________________________________  EKG My review and personal interpretation at Time: 11:13   Indication: ams  Rate: 80  Rhythm: sinus Axis: normal Other: normal intervals, no stemi, non specific st changes, ____________________________________________  RADIOLOGY  I personally reviewed all radiographic images ordered to evaluate for the above acute complaints and reviewed radiology reports and findings.  These findings were personally discussed with the patient.  Please see medical record for radiology report.  ____________________________________________   PROCEDURES  Procedure(s) performed:  Procedures    Critical Care performed: yes CRITICAL CARE Performed  by: Willy Eddy   Total critical care time: 45 minutes  Critical care time was exclusive of separately billable procedures and treating other patients.  Critical care was necessary to treat or prevent imminent or life-threatening deterioration.  Critical care was time spent personally by me on the following activities: development of treatment plan with patient and/or surrogate as well as nursing, discussions with consultants, evaluation of patient's response to treatment, examination of patient, obtaining history from patient or surrogate, ordering and performing treatments and interventions, ordering and review of laboratory studies, ordering and review of radiographic studies, pulse oximetry and re-evaluation of patient's condition.  ____________________________________________   INITIAL IMPRESSION / ASSESSMENT AND PLAN / ED COURSE  Pertinent labs & imaging results that were available during my care of the patient were reviewed by me and considered in my medical decision making (see chart for details).  DDX: Dehydration, sepsis, pna, uti, hypoglycemia, cva, drug effect, withdrawal, encephalitis   Maurice Jordan is a 61 y.o. who presents to the ED  with altered mental status.  Hx of etoh abuse, sepsis.  limited Hx 2/2 acute encephalopathy.  Currently protecting airway. No reported falls or injuries but was found down on ground.  Profoundly hypothermiPossible causes include metabolic, infectious, neurologic or toxicologic causes. Also AMS may be dehydration, malnutrition, chronic decompensation related.  Will place patient on telemetry, continuous pulse ox, obtain laboratory and diagnostic radiographic evaluation due to concern for AMS.  Will continue to monitor     Clinical Course as of Aug 23 1401  Sat Aug 23, 2017  1335 CT head without AICA.  CXR reassuring.  Patient with slight improvement in encephalopathy after rewarming IV fluids.  There is evidence of etoh on baord, acute metabolic acidosis with lactic acidosis.  Elevated CK. Still no source of infection.  May simply have been viral minimal with substance abuse as well as being in a cold environment yesterday however will start with broad-spectrum antibiotics.  Do not feel this is clinically consistent with encephalitis or meningitis.  His abdominal exam is soft and benign.  He denies any discomfort at this time.  Spoke with Dr. Judithann Sheen of hospitalist group who agrees to admit patient for further evaluation and management.  [PR]    Clinical Course User Index [PR] Willy Eddy, MD     ____________________________________________   FINAL CLINICAL IMPRESSION(S) / ED DIAGNOSES  Final diagnoses:  Acute metabolic encephalopathy  Hypothermia, initial encounter  Lactic acidosis  Elevated CK  Alcohol abuse      NEW MEDICATIONS STARTED DURING THIS VISIT:  New Prescriptions   No medications on file     Note:  This document was prepared using Dragon voice recognition software and may include unintentional dictation errors.    Willy Eddy, MD 08/23/17 [phone]

## 2017-08-24 ENCOUNTER — Other Ambulatory Visit: Payer: Self-pay

## 2017-08-24 LAB — COMPREHENSIVE METABOLIC PANEL
ALBUMIN: 3.1 g/dL — AB (ref 3.5–5.0)
ALK PHOS: 54 U/L (ref 38–126)
ALT: 28 U/L (ref 17–63)
AST: 28 U/L (ref 15–41)
Anion gap: 5 (ref 5–15)
BILIRUBIN TOTAL: 1.2 mg/dL (ref 0.3–1.2)
BUN: 34 mg/dL — ABNORMAL HIGH (ref 6–20)
CALCIUM: 8.3 mg/dL — AB (ref 8.9–10.3)
CO2: 26 mmol/L (ref 22–32)
CREATININE: 2.32 mg/dL — AB (ref 0.61–1.24)
Chloride: 103 mmol/L (ref 101–111)
GFR calc Af Amer: 33 mL/min — ABNORMAL LOW (ref >60)
GFR calc non Af Amer: 28 mL/min — ABNORMAL LOW (ref >60)
GLUCOSE: 79 mg/dL (ref 65–99)
Potassium: 4.2 mmol/L (ref 3.5–5.1)
SODIUM: 134 mmol/L — AB (ref 135–145)
TOTAL PROTEIN: 6 g/dL — AB (ref 6.5–8.1)

## 2017-08-24 LAB — GLUCOSE, CAPILLARY
GLUCOSE-CAPILLARY: 63 mg/dL — AB (ref 65–99)
GLUCOSE-CAPILLARY: 67 mg/dL (ref 65–99)
GLUCOSE-CAPILLARY: 167 mg/dL — AB (ref 65–99)
GLUCOSE-CAPILLARY: 184 mg/dL — AB (ref 65–99)
Glucose-Capillary: 140 mg/dL — ABNORMAL HIGH (ref 65–99)

## 2017-08-24 LAB — CBC
HEMATOCRIT: 35.4 % — AB (ref 40.0–52.0)
HEMOGLOBIN: 12 g/dL — AB (ref 13.0–18.0)
MCH: 31.3 pg (ref 26.0–34.0)
MCHC: 33.9 g/dL (ref 32.0–36.0)
MCV: 92.3 fL (ref 80.0–100.0)
Platelets: 175 10*3/uL (ref 150–440)
RBC: 3.83 MIL/uL — AB (ref 4.40–5.90)
RDW: 13.6 % (ref 11.5–14.5)
WBC: 3.9 10*3/uL (ref 3.8–10.6)

## 2017-08-24 MED ORDER — LORAZEPAM 2 MG/ML IJ SOLN: 1.0000 mg | Freq: Four times a day (QID) | INTRAMUSCULAR | Status: DC | PRN

## 2017-08-24 MED ORDER — FOLIC ACID 1 MG PO TABS
1.0000 mg | ORAL_TABLET | Freq: Every day | ORAL | Status: DC
  Administered 2017-08-24 – 2017-08-25 (×2): 1 mg via ORAL
  Filled 2017-08-24 (×2): qty 1

## 2017-08-24 MED ORDER — INSULIN GLARGINE 100 UNIT/ML ~~LOC~~ SOLN
10.0000 [IU] | Freq: Every day | SUBCUTANEOUS | Status: DC
  Administered 2017-08-24: 10 [IU] via SUBCUTANEOUS
  Filled 2017-08-24 (×2): qty 0.1

## 2017-08-24 MED ORDER — ADULT MULTIVITAMIN W/MINERALS CH
1.0000 | ORAL_TABLET | Freq: Every day | ORAL | Status: DC
  Administered 2017-08-24 – 2017-08-25 (×2): 1 via ORAL
  Filled 2017-08-24 (×2): qty 1

## 2017-08-24 MED ORDER — PANTOPRAZOLE SODIUM 40 MG PO TBEC: 40.0000 mg | DELAYED_RELEASE_TABLET | Freq: Two times a day (BID) | ORAL | Status: DC

## 2017-08-24 MED ORDER — LORAZEPAM 1 MG PO TABS: 1.0000 mg | ORAL_TABLET | Freq: Four times a day (QID) | ORAL | Status: DC | PRN

## 2017-08-24 MED ORDER — THIAMINE HCL 100 MG/ML IJ SOLN: 100.0000 mg | Freq: Every day | INTRAMUSCULAR | Status: DC

## 2017-08-24 MED ORDER — INSULIN ASPART 100 UNIT/ML ~~LOC~~ SOLN
0.0000 [IU] | Freq: Three times a day (TID) | SUBCUTANEOUS | Status: DC
  Administered 2017-08-24: 1 [IU] via SUBCUTANEOUS
  Administered 2017-08-24 – 2017-08-25 (×2): 2 [IU] via SUBCUTANEOUS
  Filled 2017-08-24 (×3): qty 1

## 2017-08-24 MED ORDER — VITAMIN B-1 100 MG PO TABS
100.0000 mg | ORAL_TABLET | Freq: Every day | ORAL | Status: DC
  Administered 2017-08-24 – 2017-08-25 (×2): 100 mg via ORAL
  Filled 2017-08-24 (×2): qty 1

## 2017-08-24 NOTE — Progress Notes (Signed)
[employer] - [place] [facility]                                                                                                                                                                                  Patient Demographics   Zalyn Sannicolas, is a 61 y.o. male, DOB - [birth year], RAQ:[id number]  Admit date - 08/23/2017   Admitting Physician Marguarite Arbour, MD  Outpatient Primary MD for the patient is Theodosia Blender, MD   LOS - 1  Subjective: Pt now awake, states he drank lot night before, currently patient awake He states that he has been having some shortness of breath but other than that denies any symptoms   Review of Systems:   CONSTITUTIONAL: No documented fever. No fatigue, weakness. No weight gain, no weight loss.  EYES: No blurry or double vision.  ENT: No tinnitus. No postnasal drip. No redness of the oropharynx.  RESPIRATORY: No cough, no wheeze, no hemoptysis.  Chronic dyspnea.  CARDIOVASCULAR: No chest pain. No orthopnea. No palpitations. No syncope.  GASTROINTESTINAL: No nausea, no vomiting or diarrhea. No abdominal pain. No melena or hematochezia.  GENITOURINARY: No dysuria or hematuria.  ENDOCRINE: No polyuria or nocturia. No heat or cold intolerance.  HEMATOLOGY: No anemia. No bruising. No bleeding.  INTEGUMENTARY: No rashes. No lesions.  MUSCULOSKELETAL: No arthritis. No swelling. No gout.  NEUROLOGIC: No numbness, tingling, or ataxia. No seizure-type activity.  PSYCHIATRIC: No anxiety. No insomnia. No ADD.    Vitals:   Vitals:   08/24/17 0500 08/24/17 0537 08/24/17 0719 08/24/17 0722  BP:  95/65 101/66 101/66  Pulse:  79 83 83  Resp:  18 16 16   Temp:  99.2 F (37.3 C) 98.8 F (37.1 C) 98.8 F (37.1 C)  TempSrc:  Oral Oral Oral  SpO2:  98% 97% 97%  Weight: 180 lb 3.2 oz (81.7 kg) 180 lb 11.2 oz (82 kg)    Height:        Wt Readings from Last 3 Encounters:  08/24/17 180 lb 11.2 oz (82 kg)  07/01/17 171 lb 8 oz (77.8  kg)  05/29/17 171 lb 6 oz (77.7 kg)     Intake/Output Summary (Last 24 hours) at 08/24/2017 1321 Last data filed at 08/24/2017 0802 Gross per 24 hour  Intake 606.33 ml  Output 0 ml  Net 606.33 ml    Physical Exam:   GENERAL: Pleasant-appearing in no apparent distress.  HEAD, EYES, EARS, NOSE AND THROAT: Atraumatic, normocephalic. Extraocular muscles are intact. Pupils equal and reactive to light. Sclerae anicteric. No conjunctival injection. No oro-pharyngeal erythema.  NECK: Supple. There is no jugular venous distention. No bruits, no lymphadenopathy,  no thyromegaly.  HEART: Regular rate and rhythm,. No murmurs, no rubs, no clicks.  LUNGS: Clear to auscultation bilaterally. No rales or rhonchi. No wheezes.  ABDOMEN: Soft, flat, nontender, nondistended. Has good bowel sounds. No hepatosplenomegaly appreciated.  EXTREMITIES: No evidence of any cyanosis, clubbing, or peripheral edema.  +2 pedal and radial pulses bilaterally.  NEUROLOGIC: The patient is alert, awake, and oriented x3 with no focal motor or sensory deficits appreciated bilaterally.  SKIN: Moist and warm with no rashes appreciated.  Psych: Not anxious, depressed LN: No inguinal LN enlargement    Antibiotics   Anti-infectives (From admission, onward)   Start     Dose/Rate Route Frequency Ordered Stop   08/23/17 2200  piperacillin-tazobactam (ZOSYN) IVPB 3.375 g  Status:  Discontinued     3.375 g 12.5 mL/hr over 240 Minutes Intravenous Every 8 hours 08/23/17 1822 08/24/17 1107   08/23/17 2000  vancomycin (VANCOCIN) IVPB 1000 mg/200 mL premix  Status:  Discontinued     1,000 mg 200 mL/hr over 60 Minutes Intravenous Every 24 hours 08/23/17 1823 08/24/17 1107   08/23/17 1330  piperacillin-tazobactam (ZOSYN) IVPB 3.375 g     3.375 g 100 mL/hr over 30 Minutes Intravenous  Once 08/23/17 1324 08/23/17 1429   08/23/17 1330  vancomycin (VANCOCIN) IVPB 1000 mg/200 mL premix     1,000 mg 200 mL/hr over 60 Minutes Intravenous   Once 08/23/17 1324 08/23/17 1525      Medications   Scheduled Meds: . aspirin EC  81 mg Oral Daily  . atorvastatin  80 mg Oral QPM  . carvedilol  6.25 mg Oral BID  . citalopram  20 mg Oral Daily  . docusate sodium  100 mg Oral BID  . gabapentin  600 mg Oral QHS  . heparin  5,000 Units Subcutaneous Q8H  . insulin aspart  0-9 Units Subcutaneous TID WC  . insulin glargine  20 Units Subcutaneous QHS  . sacubitril-valsartan  1 tablet Oral BID  . tamsulosin  0.4 mg Oral Daily   Continuous Infusions: PRN Meds:.acetaminophen **OR** acetaminophen, bisacodyl, hydrALAZINE, LORazepam, ondansetron **OR** ondansetron (ZOFRAN) IV   Data Review:   Micro Results Recent Results (from the past 240 hour(s))  Blood culture (routine x 2)     Status: None (Preliminary result)   Collection Time: 08/23/17 12:30 PM  Result Value Ref Range Status   Specimen Description BLOOD Blood Culture adequate volume  Final   Special Requests   Final    BOTTLES DRAWN AEROBIC AND ANAEROBIC LEFT ANTECUBITAL   Culture NO GROWTH < 24 HOURS  Final   Report Status PENDING  Incomplete  Blood culture (routine x 2)     Status: None (Preliminary result)   Collection Time: 08/23/17 12:43 PM  Result Value Ref Range Status   Specimen Description BLOOD BLOOD RIGHT HAND  Final   Special Requests   Final    BOTTLES DRAWN AEROBIC AND ANAEROBIC Blood Culture adequate volume   Culture NO GROWTH < 24 HOURS  Final   Report Status PENDING  Incomplete    Radiology Reports Ct Head Wo Contrast  Result Date: 08/23/2017 CLINICAL DATA:  61 year old male with a history of altered mental status EXAM: CT HEAD WITHOUT CONTRAST TECHNIQUE: Contiguous axial images were obtained from the base of the skull through the vertex without intravenous contrast. COMPARISON:  None. FINDINGS: Brain: No acute intracranial hemorrhage. No midline shift or mass effect. Gray-white differentiation maintained. Unremarkable appearance of the ventricular system.  Vascular: Calcifications of anterior circulation  Skull: No acute fracture.  No aggressive bone lesion identified. Sinuses/Orbits: Unremarkable appearance of the orbits. Mastoid air cells clear. No middle ear effusion. No significant sinus disease. Other: None IMPRESSION: No CT evidence of acute intracranial abnormality. Electronically Signed   By: Gilmer MorJaime  Wagner D.O.   On: 08/23/2017 12:09   Dg Chest Portable 1 View  Result Date: 08/23/2017 CLINICAL DATA:  Found unresponsive. History of drug and alcohol abuse. Hypertension. EXAM: PORTABLE CHEST 1 VIEW COMPARISON:  05/18/2017. FINDINGS: The cardiac silhouette remains borderline enlarged. Clear lungs with normal vascularity. Thoracic spine degenerative changes. IMPRESSION: No acute abnormality. Electronically Signed   By: Beckie SaltsSteven  Reid M.D.   On: 08/23/2017 11:27     CBC Recent Labs  Lab 08/23/17 1109 08/24/17 0435  WBC 3.0* 3.9  HGB 15.5 12.0*  HCT 47.1 35.4*  PLT 195 175  MCV 93.8 92.3  MCH 30.8 31.3  MCHC 32.9 33.9  RDW 13.3 13.6  LYMPHSABS 1.3  --   MONOABS 0.4  --   EOSABS 0.0  --   BASOSABS 0.0  --     Chemistries  Recent Labs  Lab 08/23/17 1109 08/24/17 0435  NA 133* 134*  K 5.1 4.2  CL 101 103  CO2 18* 26  GLUCOSE 82 79  BUN 27* 34*  CREATININE 1.84* 2.32*  CALCIUM 8.9 8.3*  AST  --  28  ALT  --  28  ALKPHOS  --  54  BILITOT  --  1.2   ------------------------------------------------------------------------------------------------------------------ estimated creatinine clearance is 32.8 mL/min (A) (by C-G formula based on SCr of 2.32 mg/dL (H)). ------------------------------------------------------------------------------------------------------------------ No results for input(s): HGBA1C in the last 72 hours. ------------------------------------------------------------------------------------------------------------------ No results for input(s): CHOL, HDL, LDLCALC, TRIG, CHOLHDL, LDLDIRECT in the last 72  hours. ------------------------------------------------------------------------------------------------------------------ No results for input(s): TSH, T4TOTAL, T3FREE, THYROIDAB in the last 72 hours.  Invalid input(s): FREET3 ------------------------------------------------------------------------------------------------------------------ No results for input(s): VITAMINB12, FOLATE, FERRITIN, TIBC, IRON, RETICCTPCT in the last 72 hours.  Coagulation profile No results for input(s): INR, PROTIME in the last 168 hours.  No results for input(s): DDIMER in the last 72 hours.  Cardiac Enzymes No results for input(s): CKMB, TROPONINI, MYOGLOBIN in the last 168 hours.  Invalid input(s): CK ------------------------------------------------------------------------------------------------------------------ Invalid input(s): POCBNP    Assessment & Plan   Patient is a 61 year old found unresponsive at his home admitted with possible SIRS  1.  SIRS ruled out no evidence of infection noted chest x-ray is negative urinalysis is negative patient currently afebrile,  His presentation could be related to alcohol intoxicatio and being found unresponsive I will discontinue all antibiotics Discontinue telemetry Discontinue IV fluids Hypothermia resolved  2.  Alcohol abuse Start pt on CIWA protocol  3. DM2 continue sliding scale insulin , due to hypoglycemia earlier this morning I will decrease his Lantus dose   4.  Chronic kidney disease stage III renal function seems to be close to baseline  5.  Chronic systolic CHF currently compensated continue his home regimen  6.  Generalized weakness PT eval pending      Code Status Orders  (From admission, onward)        Start     Ordered   08/23/17 1456  Full code  Continuous     08/23/17 1455    Code Status History    Date Active Date Inactive Code Status Order ID Comments User Context   05/18/2017 20:32 05/20/2017 17:06 Full Code  [id number]  Shaune Pollackhen, Qing, MD Inpatient   03/11/2015 21:34 03/15/2015 20:15  Full Code [id number]  Gale Journey, MD ED           Consults none  DVT Prophylaxis  Lovenox  Lab Results  Component Value Date   PLT 175 08/24/2017     Time Spent in minutes   35 minutes  Greater than 50% of time spent in care coordination and counseling patient regarding the condition and plan of care.   Auburn Bilberry M.D on 08/24/2017 at 1:21 PM  Between 7am to 6pm - Pager - [phone]  After 6pm go to [email or web address] - password [email or web address] [facility]  [facility]   Office  [phone]

## 2017-08-24 NOTE — Progress Notes (Signed)
PHARMACIST - PHYSICIAN COMMUNICATION  DR:   patel  CONCERNING: IV to Oral Route Change Policy  RECOMMENDATION: This patient is receiving protonix by the intravenous route.  Based on criteria approved by the Pharmacy and Therapeutics Committee, the intravenous medication(s) is/are being converted to the equivalent oral dose form(s).   DESCRIPTION: These criteria include:  The patient is eating (either orally or via tube) and/or has been taking other orally administered medications for a least 24 hours  The patient has no evidence of active gastrointestinal bleeding or impaired GI absorption (gastrectomy, short bowel, patient on TNA or NPO).  If you have questions about this conversion, please contact the Pharmacy Department  []   [phone] )  [facility] [x]   [phone] )  [facility] []   [phone] )  [facility] []   [phone] )  [facility] []   [phone] )  [facility]   Olene Floss, [facility] 08/24/2017 10:33 AM

## 2017-08-24 NOTE — Evaluation (Signed)
Physical Therapy Evaluation Patient Details Name: Rogie Milum MRN: [id number] DOB: 21-Aug-1956 Today's Date: 08/24/2017   History of Present Illness  Pt admitted for SIRS with complaint of unresponsive event. Pt with history of ETOH abuse, HTN, DM, and CHF.   Clinical Impression  Pt is a pleasant 61 year old male who was admitted for SIRS. Pt performs bed mobility with mod I, transfers with min assist, and ambulation with min assist and no AD. Mobility improved to only requiring CGA when using RW. Educated pt to continue use of RW for all mobility needs to improve balance and decrease falls risk. Pt demonstrates deficits with strength/mobility/endurance. Would benefit from skilled PT to address above deficits and promote optimal return to PLOF. Recommend transition to HHPT upon discharge from acute hospitalization.       Follow Up Recommendations Home health PT    Equipment Recommendations  Rolling walker with 5" wheels    Recommendations for Other Services       Precautions / Restrictions Precautions Precautions: Fall Restrictions Weight Bearing Restrictions: No      Mobility  Bed Mobility Overal bed mobility: Modified Independent             General bed mobility comments: used railing for assistance. Safe technique performed  Transfers Overall transfer level: Needs assistance Equipment used: None Transfers: Sit to/from Stand Sit to Stand: Min assist         General transfer comment: needs assist for balance, no buckling noted.  Ambulation/Gait Ambulation/Gait assistance: Min assist Ambulation Distance (Feet): 3 Feet Assistive device: None Gait Pattern/deviations: Step-to pattern     General Gait Details: ambulated with unsteady gait to recliner with slow step to gait. Further ambulation in ther-ex  Stairs            Wheelchair Mobility    Modified Rankin (Stroke Patients Only)       Balance Overall balance assessment: Needs  assistance;History of Falls Sitting-balance support: Bilateral upper extremity supported;Feet supported Sitting balance-Leahy Scale: Normal     Standing balance support: No upper extremity supported Standing balance-Leahy Scale: Fair                               Pertinent Vitals/Pain Pain Assessment: No/denies pain    Home Living Family/patient expects to be discharged to:: Private residence Living Arrangements: (girlfriend) Available Help at Discharge: Family;Available 24 hours/day Type of Home: House Home Access: Stairs to enter   [employer] of Steps: 4 Home Layout: One level Home Equipment: Cane - single point      Prior Function Level of Independence: Independent with assistive device(s)         Comments: used SPC in R hand      Hand Dominance        Extremity/Trunk Assessment   Upper Extremity Assessment Upper Extremity Assessment: Overall WFL for tasks assessed    Lower Extremity Assessment Lower Extremity Assessment: Generalized weakness(B LE grossly 4+/5)       Communication   Communication: No difficulties  Cognition Arousal/Alertness: Awake/alert Behavior During Therapy: WFL for tasks assessed/performed Overall Cognitive Status: Within Functional Limits for tasks assessed                                        General Comments      Exercises Other Exercises Other Exercises: Further ambulation  performed with RW x 200' with safe technique. Cues to keep RW close to body, however improved balance and ability to demonstrate reciprocal gait Other Exercises: Once in chair, bed soaked in urine, called aide to change bed   Assessment/Plan    PT Assessment Patient needs continued PT services  PT Problem List Decreased strength;Decreased activity tolerance;Decreased balance;Decreased mobility       PT Treatment Interventions Gait training;DME instruction;Therapeutic exercise    PT Goals (Current goals  can be found in the Care Plan section)  Acute Rehab PT Goals Patient Stated Goal: to go home today PT Goal Formulation: With patient Time For Goal Achievement: 09/07/17 Potential to Achieve Goals: Good    Frequency Min 2X/week   Barriers to discharge        Co-evaluation               AM-PAC PT "6 Clicks" Daily Activity  Outcome Measure Difficulty turning over in bed (including adjusting bedclothes, sheets and blankets)?: None Difficulty moving from lying on back to sitting on the side of the bed? : A Little Difficulty sitting down on and standing up from a chair with arms (e.g., wheelchair, bedside commode, etc,.)?: Unable Help needed moving to and from a bed to chair (including a wheelchair)?: A Little Help needed walking in hospital room?: A Little Help needed climbing 3-5 steps with a railing? : A Little 6 Click Score: 17    End of Session Equipment Utilized During Treatment: Gait belt Activity Tolerance: Patient tolerated treatment well Patient left: in chair;with chair alarm set Nurse Communication: Mobility status PT Visit Diagnosis: Unsteadiness on feet (R26.81);Muscle weakness (generalized) (M62.81);Difficulty in walking, not elsewhere classified (R26.2)    Time: [id number] PT Time Calculation (min) (ACUTE ONLY): 19 min   Charges:   PT Evaluation $PT Eval Low Complexity: 1 Low PT Treatments $Gait Training: 8-22 mins   PT G Codes:   PT G-Codes **NOT FOR INPATIENT CLASS** Functional Assessment Tool Used: AM-PAC 6 Clicks Basic Mobility Functional Limitation: Mobility: Walking and moving around Mobility: Walking and Moving Around Current Status ([id number]): At least 40 percent but less than 60 percent impaired, limited or restricted Mobility: Walking and Moving Around Goal Status [phone](G8979): At least 20 percent but less than 40 percent impaired, limited or restricted    Elizabeth PalauStephanie Glennice Marcos, PT, DPT [phone]   Hser Belanger 08/24/2017, 10:44 AM

## 2017-08-24 NOTE — Progress Notes (Signed)
Pt alert and oriented resting in room. Blood glucose on assessment was 63. Orange juice given. Rechecked after 15 mins and was 67. Will continue to monitor.

## 2017-08-25 ENCOUNTER — Ambulatory Visit: Payer: Medicaid Other | Admitting: Family

## 2017-08-25 LAB — BASIC METABOLIC PANEL
Anion gap: 4 — ABNORMAL LOW (ref 5–15)
BUN: 30 mg/dL — ABNORMAL HIGH (ref 6–20)
CALCIUM: 8.8 mg/dL — AB (ref 8.9–10.3)
CO2: 27 mmol/L (ref 22–32)
CREATININE: 2.05 mg/dL — AB (ref 0.61–1.24)
Chloride: 105 mmol/L (ref 101–111)
GFR calc Af Amer: 38 mL/min — ABNORMAL LOW (ref >60)
GFR calc non Af Amer: 33 mL/min — ABNORMAL LOW (ref >60)
GLUCOSE: 96 mg/dL (ref 65–99)
Potassium: 4.1 mmol/L (ref 3.5–5.1)
Sodium: 136 mmol/L (ref 135–145)

## 2017-08-25 LAB — CBC
HEMATOCRIT: 35 % — AB (ref 40.0–52.0)
Hemoglobin: 11.9 g/dL — ABNORMAL LOW (ref 13.0–18.0)
MCH: 31.4 pg (ref 26.0–34.0)
MCHC: 34 g/dL (ref 32.0–36.0)
MCV: 92.3 fL (ref 80.0–100.0)
Platelets: 156 10*3/uL (ref 150–440)
RBC: 3.79 MIL/uL — ABNORMAL LOW (ref 4.40–5.90)
RDW: 13.7 % (ref 11.5–14.5)
WBC: 4 10*3/uL (ref 3.8–10.6)

## 2017-08-25 LAB — GLUCOSE, CAPILLARY
Glucose-Capillary: 88 mg/dL (ref 65–99)
Glucose-Capillary: 164 mg/dL — ABNORMAL HIGH (ref 65–99)

## 2017-08-25 MED ORDER — ADULT MULTIVITAMIN W/MINERALS CH: 1.0000 | ORAL_TABLET | Freq: Every day | ORAL | 0 refills | Status: DC

## 2017-08-25 NOTE — Care Management Note (Signed)
Case Management Note  Patient Details  Name: Shaquil Aldana MRN: [id number] Date of Birth: 1956/09/06  Subjective/Objective:  RNCM consult for dicharge planning. Met with patient as he sits up at bedside. He is a;ert and oriented. States he lives alone, able to care for him self. ETOH and positive for cocaine.  PCP is Dr. Ladell Pier at [facility]. Last seen 07/2017. Has been referred to CHF clinic but has not showed up for appointments.Has a cane but will need a walker. Ordered from [facility]. No home health. PT recommending home health PT. Offered choice of home health agencies. Referral to [facility]  for HHPT.                   Action/Plan: [facility] for walker and HHPT.   Expected Discharge Date:  08/25/17               Expected Discharge Plan:  [place]  In-House Referral:     Discharge planning Services  CM Consult  Post Acute Care Choice:  Durable Medical Equipment, Home Health Choice offered to:  Patient  DME Arranged:  Walker rolling DME Agency:  [place] Arranged:  PT [facility] Agency:  [place]  Status of Service:  Completed, signed off  If discussed at [place] of Stay Meetings, dates discussed:    Additional Comments:  Jolly Mango, RN 08/25/2017, 11:25 AM

## 2017-08-25 NOTE — Progress Notes (Signed)
SLP Cancellation Note  Patient Details Name: Saud Bail MRN: [id number] DOB: [birth year]   Cancelled treatment:         Speech Therapy Note: received order, reviewed chart notes. Consulted NSG then pt. Pt denied any difficulty swallowing and is currently on a regular diet; tolerates swallowing pills w/ water per NSG. Pt conversed at conversational level w/out deficits noted; pt denied any speech-language deficits.  Pt was met w/ during his lunch meal of a hamburger and potatoes. No immediate, overt s/s of aspiration were observed.  No further skilled ST services indicated as pt appears at his baseline. Pt agreed. NSG to reconsult if any change in status.   Carolynn Sayers, SLP-Graduate Student Carolynn Sayers 08/25/2017, 12:24 PM   The information in this patient note, response to treatment, and overall treatment plan developed has been reviewed and agreed upon by this clinician.  Orinda Kenner, [place], [place] [phone] 08/25/17,1:13 PM

## 2017-08-25 NOTE — Discharge Summary (Signed)
[facility] [place] [facility]   PATIENT NAME: Maurice Jordan    MR#:  [id number]  DATE OF BIRTH:  06-19-1956  DATE OF ADMISSION:  08/23/2017 ADMITTING PHYSICIAN: Marguarite ArbourJeffrey D Sparks, MD  DATE OF DISCHARGE: 08/25/2017  PRIMARY CARE PHYSICIAN: Theodosia Blender'Connell, Brendon M, MD    ADMISSION DIAGNOSIS:  Lactic acidosis [E87.2] Alcohol abuse [F10.10] Elevated CK [R74.8] Hypothermia, initial encounter [T68.XXXA] Acute metabolic encephalopathy [G93.41]  DISCHARGE DIAGNOSIS:  Alcohol abuse Acute metabolic encephalopathy in the setting of alcohol use resolved  SECONDARY DIAGNOSIS:   Past Medical History:  Diagnosis Date  . Alcohol abuse   . Congestive heart failure (HCC)   . Depression   . Diabetes (HCC)   . Hx MRSA infection 04/15/2014  . Hyperlipidemia   . Hypertension   . Neuropathy   . Osteomyelitis of toe (HCC)    3rd toe    HOSPITAL COURSE:   61 year old found unresponsive at his home admitted with possible SIRS  1.  SIRS ruled out no evidence of infection noted chest x-ray is negative urinalysis is negative patient currently afebrile,  His presentation could be related to alcohol intoxicatio and being found unresponsive  discontinued all antibiotics Discontinue telemetry Discontinue IV fluids Hypothermia resolved  2.  Alcohol abuse Start pt on CIWA protocol--- patient scoring 0 at present No signs of withdrawal.  Advised alcohol abstinence patient so far appears to be motivated  3. DM2 continue sliding scale insulin -Resume home meds 4.  Chronic kidney disease stage III renal function seems to be close to baseline  5.  Chronic systolic CHF currently compensated continue his home regimen  6.  Generalized weakness  -PT eval recommends home health PT and rolling walker  Overall patient feels well.  Will discharge to home.  CONSULTS OBTAINED:    DRUG ALLERGIES:  No Known Allergies  DISCHARGE MEDICATIONS:   Current Discharge  Medication List    START taking these medications   Details  Multiple Vitamin (MULTIVITAMIN WITH MINERALS) TABS tablet Take 1 tablet daily by mouth. Qty: 30 tablet, Refills: 0      CONTINUE these medications which have NOT CHANGED   Details  aspirin EC 81 MG tablet Take 1 tablet (81 mg total) by mouth daily. Qty: 30 tablet, Refills: 2    atorvastatin (LIPITOR) 80 MG tablet Take 1 tablet (80 mg total) by mouth every evening. Qty: 30 tablet, Refills: 2    carvedilol (COREG) 6.25 MG tablet Take 1 tablet (6.25 mg total) by mouth 2 (two) times daily. Qty: 60 tablet, Refills: 2    citalopram (CELEXA) 20 MG tablet Take 1 tablet (20 mg total) by mouth daily. Qty: 30 tablet, Refills: 2    furosemide (LASIX) 20 MG tablet Take 20 mg by mouth as needed for fluid or edema.    gabapentin (NEURONTIN) 300 MG capsule Take 900 mg by mouth at bedtime.    insulin glargine (LANTUS) 100 UNIT/ML injection Inject 0.27 mLs (27 Units total) into the skin at bedtime. Qty: 100 mL, Refills: 0    sacubitril-valsartan (ENTRESTO) 24-26 MG Take 1 tablet by mouth 2 (two) times daily. Qty: 60 tablet, Refills: 2    tamsulosin (FLOMAX) 0.4 MG CAPS capsule Take 1 capsule (0.4 mg total) by mouth daily. Qty: 30 capsule, Refills: 2        If you experience worsening of your admission symptoms, develop shortness of breath, life threatening emergency, suicidal or homicidal thoughts you must seek medical attention immediately by calling 911  or calling your MD immediately  if symptoms less severe.  You Must read complete instructions/literature along with all the possible adverse reactions/side effects for all the Medicines you take and that have been prescribed to you. Take any new Medicines after you have completely understood and accept all the possible adverse reactions/side effects.   Please note  You were cared for by a hospitalist during your hospital stay. If you have any questions about your discharge  medications or the care you received while you were in the hospital after you are discharged, you can call the unit and asked to speak with the hospitalist on call if the hospitalist that took care of you is not available. Once you are discharged, your primary care physician will handle any further medical issues. Please note that NO REFILLS for any discharge medications will be authorized once you are discharged, as it is imperative that you return to your primary care physician (or establish a relationship with a primary care physician if you do not have one) for your aftercare needs so that they can reassess your need for medications and monitor your lab values. Today   SUBJECTIVE   No new complaints  VITAL SIGNS:  Blood pressure 122/81, pulse 81, temperature 98 F (36.7 C), temperature source Oral, resp. rate 18, height 5\' 6"  (1.676 m), weight 82.4 kg (181 lb 10.5 oz), SpO2 99 %.  I/O:    Intake/Output Summary (Last 24 hours) at 08/25/2017 1106 Last data filed at 08/25/2017 1048 Gross per 24 hour  Intake 720 ml  Output -  Net 720 ml    PHYSICAL EXAMINATION:  GENERAL:  61 y.o.-year-old patient lying in the bed with no acute distress.  EYES: Pupils equal, round, reactive to light and accommodation. No scleral icterus. Extraocular muscles intact.  HEENT: Head atraumatic, normocephalic. Oropharynx and nasopharynx clear.  NECK:  Supple, no jugular venous distention. No thyroid enlargement, no tenderness.  LUNGS: Normal breath sounds bilaterally, no wheezing, rales,rhonchi or crepitation. No use of accessory muscles of respiration.  CARDIOVASCULAR: S1, S2 normal. No murmurs, rubs, or gallops.  ABDOMEN: Soft, non-tender, non-distended. Bowel sounds present. No organomegaly or mass.  EXTREMITIES: No pedal edema, cyanosis, or clubbing.  NEUROLOGIC: Cranial nerves II through XII are intact. Muscle strength 5/5 in all extremities. Sensation intact. Gait not checked.  PSYCHIATRIC: The patient  is alert and oriented x 3.  SKIN: No obvious rash, lesion, or ulcer.   DATA REVIEW:   CBC  Recent Labs  Lab 08/25/17 0444  WBC 4.0  HGB 11.9*  HCT 35.0*  PLT 156    Chemistries  Recent Labs  Lab 08/24/17 0435 08/25/17 0444  NA 134* 136  K 4.2 4.1  CL 103 105  CO2 26 27  GLUCOSE 79 96  BUN 34* 30*  CREATININE 2.32* 2.05*  CALCIUM 8.3* 8.8*  AST 28  --   ALT 28  --   ALKPHOS 54  --   BILITOT 1.2  --     Microbiology Results   Recent Results (from the past 240 hour(s))  Blood culture (routine x 2)     Status: None (Preliminary result)   Collection Time: 08/23/17 12:30 PM  Result Value Ref Range Status   Specimen Description BLOOD Blood Culture adequate volume  Final   Special Requests   Final    BOTTLES DRAWN AEROBIC AND ANAEROBIC LEFT ANTECUBITAL   Culture NO GROWTH 2 DAYS  Final   Report Status PENDING  Incomplete  Blood culture (  routine x 2)     Status: None (Preliminary result)   Collection Time: 08/23/17 12:43 PM  Result Value Ref Range Status   Specimen Description BLOOD BLOOD RIGHT HAND  Final   Special Requests   Final    BOTTLES DRAWN AEROBIC AND ANAEROBIC Blood Culture adequate volume   Culture NO GROWTH 2 DAYS  Final   Report Status PENDING  Incomplete    RADIOLOGY:  Ct Head Wo Contrast  Result Date: 08/23/2017 CLINICAL DATA:  61 year old male with a history of altered mental status EXAM: CT HEAD WITHOUT CONTRAST TECHNIQUE: Contiguous axial images were obtained from the base of the skull through the vertex without intravenous contrast. COMPARISON:  None. FINDINGS: Brain: No acute intracranial hemorrhage. No midline shift or mass effect. Gray-white differentiation maintained. Unremarkable appearance of the ventricular system. Vascular: Calcifications of anterior circulation Skull: No acute fracture.  No aggressive bone lesion identified. Sinuses/Orbits: Unremarkable appearance of the orbits. Mastoid air cells clear. No middle ear effusion. No  significant sinus disease. Other: None IMPRESSION: No CT evidence of acute intracranial abnormality. Electronically Signed   By: Gilmer Mor D.O.   On: 08/23/2017 12:09   Dg Chest Portable 1 View  Result Date: 08/23/2017 CLINICAL DATA:  Found unresponsive. History of drug and alcohol abuse. Hypertension. EXAM: PORTABLE CHEST 1 VIEW COMPARISON:  05/18/2017. FINDINGS: The cardiac silhouette remains borderline enlarged. Clear lungs with normal vascularity. Thoracic spine degenerative changes. IMPRESSION: No acute abnormality. Electronically Signed   By: Beckie Salts M.D.   On: 08/23/2017 11:27     Management plans discussed with the patient, family and they are in agreement.  CODE STATUS:     Code Status Orders  (From admission, onward)        Start     Ordered   08/23/17 1456  Full code  Continuous     08/23/17 1455    Code Status History    Date Active Date Inactive Code Status Order ID Comments User Context   05/18/2017 20:32 05/20/2017 17:06 Full Code [id number]  Shaune Pollack, MD Inpatient   03/11/2015 21:34 03/15/2015 20:15 Full Code [id number]  Gale Journey, MD ED      TOTAL TIME TAKING CARE OF THIS PATIENT: 40 minutes.    Taneya Conkel M.D on 08/25/2017 at 11:06 AM  Between 7am to 6pm - Pager - [phone] After 6pm go to [email or web address] - password [email or web address]  [facility]  Office  [phone]  CC: Primary care physician; Theodosia Blender, MD

## 2017-08-25 NOTE — Progress Notes (Signed)
Patient was discharged home with family. IV removed with cath. Reviewed discharge instructions, meds given. Allowed time for questions. New walker sent home with patient.

## 2017-08-25 NOTE — Plan of Care (Signed)
Patient is alert and oriented, no signs of distress. Is able to call out to use bathroom with walker and 1 staff assist

## 2017-08-25 NOTE — Discharge Instructions (Signed)
Heart Failure Clinic appointment on September 01 2017 at 9:20am with Clarisa Kindred, FNP. Please call [phone] to reschedule.   Pt advised not to drink ETOH!!

## 2017-08-28 LAB — CULTURE, BLOOD (ROUTINE X 2)
CULTURE: NO GROWTH
Culture: NO GROWTH
Special Requests: ADEQUATE
Specimen Description: ADEQUATE

## 2017-09-01 ENCOUNTER — Ambulatory Visit: Payer: Medicaid Other | Admitting: Family

## 2017-09-10 ENCOUNTER — Ambulatory Visit: Payer: Medicaid Other | Admitting: Family

## 2017-09-22 ENCOUNTER — Telehealth: Payer: Self-pay | Admitting: Family

## 2017-09-22 ENCOUNTER — Ambulatory Visit: Payer: Medicaid Other | Admitting: Family

## 2017-09-22 NOTE — Telephone Encounter (Signed)
Patient did not show for his Heart Failure Clinic appointment on 09/22/17. Will attempt to reschedule.   This is the 5th appointment that he has missed.

## 2017-09-22 NOTE — Progress Notes (Deleted)
Agree with pharmacist note below.  Physical exam and ROS done by myself.  Due to low BP, will decrease his furosemide to 20mg  PRN based on weight, edema or shortness of breath. Verbalized understanding of those instructions as well as the importance of taking his carvedilol twice daily.  Did drink 4- 12 ounce cans of beer yesterday as he was celebrating his birthday. Niece bought him a case but he only drank 4. Says that he's not planning on drinking anymore from the case.  Return here in 1 month or sooner for any questions/problems before then.  Clarisa Kindred, FNP HF Clinic at [facility]     Patient ID: Trevian Nickol, male    DOB: 1956-02-11, 61 y.o.   MRN: [id number]  HPI  [name] is a 61 y/o male with a history of depression, DM, hyperlipidemia, HTN, osteomyelitis, current alcohol use and chronic heart failure.   Echo report from 05/19/17 reviewed and shows an EF of 30% along with mild MR and normal pulmonary artery pressures.  Admitted 05/18/17 due to syncope thought to be due to alcohol intoxication. MRI/EEG done and were negative. Cardiology and neurology consults were obtained. Carotid dopplers negative. Elevated troponin thought to be due to demand ischemia. Discharged home after 2 days.  He presents today for a follow up visit and states he is doing well. Denies any edema but had an episode of slight chest pain yesterday but thinks due to gas. Denies any symptoms such as headache or dizziness. Patient presents with all medication bottles and a paper with daily weights and daily blood glucose levels recorded.   Past Medical History:  Diagnosis Date  . Alcohol abuse   . Congestive heart failure (HCC)   . Depression   . Diabetes (HCC)   . Hx MRSA infection 04/15/2014  . Hyperlipidemia   . Hypertension   . Neuropathy   . Osteomyelitis of toe (HCC)    3rd toe   Past Surgical History:  Procedure Laterality Date  . AMPUTATION OF REPLICATED TOES     No family history on  file. Social History   Tobacco Use  . Smoking status: Never Smoker  . Smokeless tobacco: Former Engineer, water Use Topics  . Alcohol use: Yes    Comment: 2-3 40's a day   No Known Allergies  Review of Systems  Constitutional: Negative for appetite change and fatigue.  HENT: Negative for congestion, rhinorrhea and sore throat.   Eyes: Negative.   Respiratory: Negative for chest tightness and shortness of breath.   Cardiovascular: Positive for chest pain ("yesterday but thought it was gas"). Negative for palpitations and leg swelling.  Gastrointestinal: Negative for abdominal distention and abdominal pain.  Endocrine: Negative.   Genitourinary: Negative.   Musculoskeletal: Negative for back pain and neck pain.  Skin: Negative.   Allergic/Immunologic: Negative.   Neurological: Negative for dizziness and light-headedness.  Hematological: Negative for adenopathy. Does not bruise/bleed easily.  Psychiatric/Behavioral: Negative for dysphoric mood and sleep disturbance (sleeping on 2 pillows). The patient is not nervous/anxious.     Lab Results  Component Value Date   CREATININE 2.05 (H) 08/25/2017   CREATININE 2.32 (H) 08/24/2017   CREATININE 1.84 (H) 08/23/2017    Physical Exam  Constitutional: He is oriented to person, place, and time. He appears well-developed and well-nourished.  HENT:  Head: Normocephalic and atraumatic.  Neck: Normal range of motion. Neck supple. No JVD present.  Cardiovascular: Normal rate and regular rhythm.  Pulmonary/Chest: Effort normal and breath sounds  normal.  Abdominal: Soft. He exhibits no distension. There is no tenderness.  Musculoskeletal: He exhibits no edema or tenderness.  Neurological: He is alert and oriented to person, place, and time.  Skin: Skin is warm and dry.  Psychiatric: He has a normal mood and affect. His behavior is normal. Thought content normal.  Nursing note and vitals reviewed.  Assessment & Plan: Patient endorses  not taking any of his AM medications before appointment today.   1: Chronic heart failure with reduced ejection fraction-  - NYHA class I - euvolemic today - Exercising 20-30 min, 2-3 days/week  - Weighing daily and instructed to call for an overnight weight gain of >2 pounds or a weekly weight gain of >5 pounds - Reviewed fluid intake; No more than 40-50 ounces/day  - sees PCP/cardiology at [facility] (O-Connell)   2: Hypotension- - BP low but patient denies dizziness - NP changed furosemide 20mg to PRN based on increase in weight, edema and/or SOB  - Patient was only taking Coreg once a day; Counseled on taking Coreg two times a day, as written on the medication bottle  - consider titrating up entresto if able  3: Diabetes- - Blood glucose 149  - Patient checks BG 4 x day and records on sheet of paper; Per recorded BG, patient had 2-3 lows (<80) within last ~4 weeks; Patient endorses feeling sweaty, dizzy and nauseas when having a low; Reviewed with patient what to do when experiencing a low BG  - continues on Lantus qhs  Patient brought all medication bottles and a sheet with recorded daily weights and BG levels from the last ~4 weeks. Each medication was verbally reviewed with the patient.   Return in 1 month or sooner for any questions/problems before then.   Stephanie Shuder, PharmD  Pharmacy Resident  07/01/17  

## 2017-09-23 ENCOUNTER — Ambulatory Visit: Payer: Medicaid Other | Admitting: Family

## 2017-09-23 NOTE — Progress Notes (Deleted)
Agree with pharmacist note below.  Physical exam and ROS done by myself.  Due to low BP, will decrease his furosemide to 20mg  PRN based on weight, edema or shortness of breath. Verbalized understanding of those instructions as well as the importance of taking his carvedilol twice daily.  Did drink 4- 12 ounce cans of beer yesterday as he was celebrating his birthday. Niece bought him a case but he only drank 4. Says that he's not planning on drinking anymore from the case.  Return here in 1 month or sooner for any questions/problems before then.  Clarisa Kindred, FNP HF Clinic at [facility]     Patient ID: Maurice Jordan, male    DOB: 1956-02-11, 61 y.o.   MRN: [id number]  HPI  Maurice Jordan is a 61 y/o male with a history of depression, DM, hyperlipidemia, HTN, osteomyelitis, current alcohol use and chronic heart failure.   Echo report from 05/19/17 reviewed and shows an EF of 30% along with mild Maurice and normal pulmonary artery pressures.  Admitted 05/18/17 due to syncope thought to be due to alcohol intoxication. MRI/EEG done and were negative. Cardiology and neurology consults were obtained. Carotid dopplers negative. Elevated troponin thought to be due to demand ischemia. Discharged home after 2 days.  He presents today for a follow up visit and states he is doing well. Denies any edema but had an episode of slight chest pain yesterday but thinks due to gas. Denies any symptoms such as headache or dizziness. Patient presents with all medication bottles and a paper with daily weights and daily blood glucose levels recorded.   Past Medical History:  Diagnosis Date  . Alcohol abuse   . Congestive heart failure (HCC)   . Depression   . Diabetes (HCC)   . Hx MRSA infection 04/15/2014  . Hyperlipidemia   . Hypertension   . Neuropathy   . Osteomyelitis of toe (HCC)    3rd toe   Past Surgical History:  Procedure Laterality Date  . AMPUTATION OF REPLICATED TOES     No family history on  file. Social History   Tobacco Use  . Smoking status: Never Smoker  . Smokeless tobacco: Former Engineer, water Use Topics  . Alcohol use: Yes    Comment: 2-3 40's a day   No Known Allergies  Review of Systems  Constitutional: Negative for appetite change and fatigue.  HENT: Negative for congestion, rhinorrhea and sore throat.   Eyes: Negative.   Respiratory: Negative for chest tightness and shortness of breath.   Cardiovascular: Positive for chest pain ("yesterday but thought it was gas"). Negative for palpitations and leg swelling.  Gastrointestinal: Negative for abdominal distention and abdominal pain.  Endocrine: Negative.   Genitourinary: Negative.   Musculoskeletal: Negative for back pain and neck pain.  Skin: Negative.   Allergic/Immunologic: Negative.   Neurological: Negative for dizziness and light-headedness.  Hematological: Negative for adenopathy. Does not bruise/bleed easily.  Psychiatric/Behavioral: Negative for dysphoric mood and sleep disturbance (sleeping on 2 pillows). The patient is not nervous/anxious.     Lab Results  Component Value Date   CREATININE 2.05 (H) 08/25/2017   CREATININE 2.32 (H) 08/24/2017   CREATININE 1.84 (H) 08/23/2017    Physical Exam  Constitutional: He is oriented to person, place, and time. He appears well-developed and well-nourished.  HENT:  Head: Normocephalic and atraumatic.  Neck: Normal range of motion. Neck supple. No JVD present.  Cardiovascular: Normal rate and regular rhythm.  Pulmonary/Chest: Effort normal and breath sounds  normal.  Abdominal: Soft. He exhibits no distension. There is no tenderness.  Musculoskeletal: He exhibits no edema or tenderness.  Neurological: He is alert and oriented to person, place, and time.  Skin: Skin is warm and dry.  Psychiatric: He has a normal mood and affect. His behavior is normal. Thought content normal.  Nursing note and vitals reviewed.  Assessment & Plan: Patient endorses  not taking any of his AM medications before appointment today.   1: Chronic heart failure with reduced ejection fraction-  - NYHA class I - euvolemic today - Exercising 20-30 min, 2-3 days/week  - Weighing daily and instructed to call for an overnight weight gain of >2 pounds or a weekly weight gain of >5 pounds - Reviewed fluid intake; No more than 40-50 ounces/day  - [facility] ([place])   2: Hypotension- - BP low but patient denies dizziness - NP changed furosemide 20mg  to PRN based on increase in weight, edema and/or SOB  - Patient was only taking Coreg once a day; Counseled on taking Coreg two times a day, as written on the medication bottle  - consider titrating up entresto if able  3: Diabetes- - Blood glucose 149  - Patient checks BG 4 x day and records on sheet of paper; Per recorded BG, patient had 2-3 lows (<80) within last ~4 weeks; Patient endorses feeling sweaty, dizzy and nauseas when having a low; Reviewed with patient what to do when experiencing a low BG  - continues on Lantus qhs  Patient brought all medication bottles and a sheet with recorded daily weights and BG levels from the last ~4 weeks. Each medication was verbally reviewed with the patient.   Return in 1 month or sooner for any questions/problems before then.   Cleopatra CedarStephanie Shuder, PharmD  Pharmacy Resident  07/01/17

## 2017-10-01 ENCOUNTER — Ambulatory Visit: Payer: Medicaid Other | Admitting: Family

## 2017-10-08 ENCOUNTER — Ambulatory Visit: Payer: Medicaid Other | Attending: Family | Admitting: Family

## 2017-10-08 ENCOUNTER — Other Ambulatory Visit: Payer: Self-pay

## 2017-10-08 ENCOUNTER — Encounter: Payer: Self-pay | Admitting: Family

## 2017-10-08 VITALS — BP 121/75 | HR 78 | Resp 18 | Ht 67.0 in | Wt 175.4 lb

## 2017-10-08 DIAGNOSIS — G629 Polyneuropathy, unspecified: Secondary | ICD-10-CM | POA: Diagnosis not present

## 2017-10-08 DIAGNOSIS — Z79899 Other long term (current) drug therapy: Secondary | ICD-10-CM | POA: Insufficient documentation

## 2017-10-08 DIAGNOSIS — R42 Dizziness and giddiness: Secondary | ICD-10-CM | POA: Insufficient documentation

## 2017-10-08 DIAGNOSIS — R079 Chest pain, unspecified: Secondary | ICD-10-CM | POA: Insufficient documentation

## 2017-10-08 DIAGNOSIS — R0602 Shortness of breath: Secondary | ICD-10-CM | POA: Insufficient documentation

## 2017-10-08 DIAGNOSIS — E785 Hyperlipidemia, unspecified: Secondary | ICD-10-CM | POA: Diagnosis not present

## 2017-10-08 DIAGNOSIS — Z7982 Long term (current) use of aspirin: Secondary | ICD-10-CM | POA: Diagnosis not present

## 2017-10-08 DIAGNOSIS — I110 Hypertensive heart disease with heart failure: Secondary | ICD-10-CM | POA: Diagnosis present

## 2017-10-08 DIAGNOSIS — E1022 Type 1 diabetes mellitus with diabetic chronic kidney disease: Secondary | ICD-10-CM

## 2017-10-08 DIAGNOSIS — I10 Essential (primary) hypertension: Secondary | ICD-10-CM

## 2017-10-08 DIAGNOSIS — Z794 Long term (current) use of insulin: Secondary | ICD-10-CM | POA: Diagnosis not present

## 2017-10-08 DIAGNOSIS — Z9889 Other specified postprocedural states: Secondary | ICD-10-CM | POA: Diagnosis not present

## 2017-10-08 DIAGNOSIS — I5022 Chronic systolic (congestive) heart failure: Secondary | ICD-10-CM | POA: Diagnosis not present

## 2017-10-08 DIAGNOSIS — F1010 Alcohol abuse, uncomplicated: Secondary | ICD-10-CM

## 2017-10-08 DIAGNOSIS — F329 Major depressive disorder, single episode, unspecified: Secondary | ICD-10-CM | POA: Insufficient documentation

## 2017-10-08 DIAGNOSIS — E1169 Type 2 diabetes mellitus with other specified complication: Secondary | ICD-10-CM | POA: Insufficient documentation

## 2017-10-08 DIAGNOSIS — N183 Chronic kidney disease, stage 3 (moderate): Secondary | ICD-10-CM

## 2017-10-08 DIAGNOSIS — Z7289 Other problems related to lifestyle: Secondary | ICD-10-CM | POA: Insufficient documentation

## 2017-10-08 NOTE — Patient Instructions (Signed)
Continue weighing daily and call for an overnight weight gain of > 2 pounds or a weekly weight gain of >5 pounds. 

## 2017-10-08 NOTE — Progress Notes (Signed)
Patient ID: Maurice Jordan, male    DOB: November 25, 1955, 61 y.o.   MRN: [id number]  HPI  Maurice Jordan is a 61 y/o male with a history of depression, DM, hyperlipidemia, HTN, osteomyelitis, current alcohol use and chronic heart failure.   Echo report from 05/19/17 reviewed and shows an EF of 30% along with mild Maurice and normal pulmonary artery pressures.  Admitted 08/23/17 due to unresponsiveness possibly due to alcohol use. Discharged home after 2 days. Admitted 05/18/17 due to syncope thought to be due to alcohol intoxication. MRI/EEG done and were negative. Cardiology and neurology consults were obtained. Carotid dopplers negative. Elevated troponin thought to be due to demand ischemia. Discharged home after 2 days.  He presents today for a follow up visit with a chief complaint of minimal shortness of breath upon moderate exertion. He describes this as chronic in nature having been present for several years with varying levels of severity. He has associated fatigue, chest pain, dizziness and gradual weight gain. He denies any cough, edema, palpitations or difficulty sleeping.   Past Medical History:  Diagnosis Date  . Alcohol abuse   . Congestive heart failure (HCC)   . Depression   . Diabetes (HCC)   . Hx MRSA infection 04/15/2014  . Hyperlipidemia   . Hypertension   . Neuropathy   . Osteomyelitis of toe (HCC)    3rd toe   Past Surgical History:  Procedure Laterality Date  . AMPUTATION OF REPLICATED TOES     No family history on file. Social History   Tobacco Use  . Smoking status: Never Smoker  . Smokeless tobacco: Former Engineer, waterUser  Substance Use Topics  . Alcohol use: Yes    Comment: 2-3 40's a day   No Known Allergies  Prior to Admission medications   Medication Sig Start Date End Date Taking? Authorizing Provider  aspirin EC 81 MG tablet Take 1 tablet (81 mg total) by mouth daily. 05/20/17  Yes Enid BaasKalisetti, Radhika, MD  atorvastatin (LIPITOR) 80 MG tablet Take 1 tablet (80 mg total)  by mouth every evening. 05/20/17  Yes Enid BaasKalisetti, Radhika, MD  carvedilol (COREG) 6.25 MG tablet Take 1 tablet (6.25 mg total) by mouth 2 (two) times daily. 05/20/17  Yes Enid BaasKalisetti, Radhika, MD  citalopram (CELEXA) 20 MG tablet Take 1 tablet (20 mg total) by mouth daily. 05/20/17  Yes Enid BaasKalisetti, Radhika, MD  furosemide (LASIX) 20 MG tablet Take 20 mg by mouth daily.    Yes [provider]  gabapentin (NEURONTIN) 300 MG capsule Take 900 mg by mouth at bedtime.   Yes [provider]  insulin glargine (LANTUS) 100 UNIT/ML injection Inject 30 Units into the skin at bedtime.   Yes [provider]  sacubitril-valsartan (ENTRESTO) 24-26 MG Take 1 tablet by mouth 2 (two) times daily. 05/21/17  Yes Enid BaasKalisetti, Radhika, MD  tamsulosin (FLOMAX) 0.4 MG CAPS capsule Take 1 capsule (0.4 mg total) by mouth daily. 05/20/17  Yes Enid BaasKalisetti, Radhika, MD  Multiple Vitamin (MULTIVITAMIN WITH MINERALS) TABS tablet Take 1 tablet daily by mouth. Patient not taking: Reported on 10/08/2017 08/26/17   Enedina FinnerPatel, Sona, MD    Review of Systems  Constitutional: Positive for fatigue. Negative for appetite change.  HENT: Negative for congestion, rhinorrhea and sore throat.   Eyes: Negative.   Respiratory: Positive for shortness of breath (when walking long distances). Negative for cough and chest tightness.   Cardiovascular: Positive for chest pain ("yesterday but thought it was gas"). Negative for palpitations and leg swelling.  Gastrointestinal:  Negative for abdominal distention and abdominal pain.  Endocrine: Negative.   Genitourinary: Negative.   Musculoskeletal: Negative for back pain and neck pain.  Skin: Negative.   Allergic/Immunologic: Negative.   Neurological: Positive for dizziness (at times). Negative for light-headedness.  Hematological: Negative for adenopathy. Does not bruise/bleed easily.  Psychiatric/Behavioral: Negative for dysphoric mood and sleep disturbance (sleeping on 2 pillows). The  patient is not nervous/anxious.    Vitals:   10/08/17 0827  BP: 121/75  Pulse: 78  Resp: 18  SpO2: 100%  Weight: 175 lb 6 oz (79.5 kg)  Height: 5\' 7"  (1.702 m)   Wt Readings from Last 3 Encounters:  10/08/17 175 lb 6 oz (79.5 kg)  08/25/17 181 lb 10.5 oz (82.4 kg)  07/01/17 171 lb 8 oz (77.8 kg)    Lab Results  Component Value Date   CREATININE 2.05 (H) 08/25/2017   CREATININE 2.32 (H) 08/24/2017   CREATININE 1.84 (H) 08/23/2017    Physical Exam  Constitutional: He is oriented to person, place, and time. He appears well-developed and well-nourished.  HENT:  Head: Normocephalic and atraumatic.  Neck: Normal range of motion. Neck supple. No JVD present.  Cardiovascular: Normal rate and regular rhythm.  Pulmonary/Chest: Effort normal and breath sounds normal.  Abdominal: Soft. He exhibits no distension. There is no tenderness.  Musculoskeletal: He exhibits no edema or tenderness.  Neurological: He is alert and oriented to person, place, and time.  Skin: Skin is warm and dry.  Psychiatric: He has a normal mood and affect. His behavior is normal. Thought content normal.  Nursing note and vitals reviewed.  Assessment & Plan:  1: Chronic heart failure with reduced ejection fraction-  - NYHA class II - euvolemic today - Exercising 20-30 min, 2-3 days/week  - Weighing daily and reminded to call for an overnight weight gain of >2 pounds or a weekly weight gain of >5 pounds - weight up 4 pounds since he was last here - Reviewed fluid intake; No more than 40-50 ounces/day  - [facility] at [facility])  - patient reports receiving flu/ pneumonia vaccines for this season already - consider increasing entresto at his next visit. He has missed a couple of appointments so need to make sure he will be consistent with coming prior to putting on entresto so that we can keep up with his renal function  2: HTN- - BP looks good today - back to taking furosemide 20mg  daily - BMP  from 08/25/17 reviewed and showed sodium 136, potassium 4.1 and GFR 38  3: Diabetes- - Blood glucose at home this morning was 105 - continues on Lantus qhs - saw PCP Fredia Beets) 08/05/17  4: Alcohol use- - had a shot of liquor yesterday and nothing prior to that - complete cessation discussed for 3 minutes with him  Patient did not bring his medications nor a list. Each medication was verbally reviewed with the patient and he was encouraged to bring the bottles to every visit to confirm accuracy of list.  Return in 1 month or sooner for any questions/problems before then.

## 2017-11-03 NOTE — Progress Notes (Signed)
Patient ID: Maurice Jordan, male    DOB: 06/16/1956, 62 y.o.   MRN: [id number]  HPI  Maurice Jordan is a 62 y/o male with a history of depression, DM, hyperlipidemia, HTN, osteomyelitis, current alcohol use and chronic heart failure.   Echo report from 05/19/17 reviewed and shows an EF of 30% along with mild Maurice and normal pulmonary artery pressures.  Admitted 08/23/17 due to unresponsiveness possibly due to alcohol use. Discharged home after 2 days. Admitted 05/18/17 due to syncope thought to be due to alcohol intoxication. MRI/EEG done and were negative. Cardiology and neurology consults were obtained. Carotid dopplers negative. Elevated troponin thought to be due to demand ischemia. Discharged home after 2 days.  He presents today for a follow up visit with a chief complaint of mild shortness of breath upon moderate exertion. He describes this as chronic in nature having been present for several years with varying levels of severity. He says that he only gets short of breath now when he's walking long distances. He has associated fatigue & dizziness along with this. He denies any chest pain, cough, edema, palpitations, abdominal distention, difficulty sleeping or weight gain.   Past Medical History:  Diagnosis Date  . Alcohol abuse   . Congestive heart failure (HCC)   . Depression   . Diabetes (HCC)   . Hx MRSA infection 04/15/2014  . Hyperlipidemia   . Hypertension   . Neuropathy   . Osteomyelitis of toe (HCC)    3rd toe   Past Surgical History:  Procedure Laterality Date  . AMPUTATION OF REPLICATED TOES     No family history on file. Social History   Tobacco Use  . Smoking status: Never Smoker  . Smokeless tobacco: Former Engineer, water Use Topics  . Alcohol use: Yes    Comment: 2-3 40's a day   No Known Allergies  Prior to Admission medications   Medication Sig Start Date End Date Taking? Authorizing Provider  aspirin EC 81 MG tablet Take 1 tablet (81 mg total) by mouth  daily. 05/20/17  Yes Enid Baas, MD  atorvastatin (LIPITOR) 80 MG tablet Take 1 tablet (80 mg total) by mouth every evening. 05/20/17  Yes Enid Baas, MD  carvedilol (COREG) 6.25 MG tablet Take 1 tablet (6.25 mg total) by mouth 2 (two) times daily. 05/20/17  Yes Enid Baas, MD  citalopram (CELEXA) 20 MG tablet Take 1 tablet (20 mg total) by mouth daily. 05/20/17  Yes Enid Baas, MD  furosemide (LASIX) 20 MG tablet Take 20 mg by mouth daily.    Yes [provider]  gabapentin (NEURONTIN) 300 MG capsule Take 900 mg by mouth at bedtime.   Yes [provider]  insulin glargine (LANTUS) 100 UNIT/ML injection Inject 30 Units into the skin at bedtime.   Yes [provider]  sacubitril-valsartan (ENTRESTO) 24-26 MG Take 1 tablet by mouth 2 (two) times daily. 05/21/17  Yes Enid Baas, MD  tamsulosin (FLOMAX) 0.4 MG CAPS capsule Take 1 capsule (0.4 mg total) by mouth daily. 05/20/17  Yes Enid Baas, MD    Review of Systems  Constitutional: Positive for fatigue. Negative for appetite change.  HENT: Negative for congestion, rhinorrhea and sore throat.   Eyes: Negative.   Respiratory: Positive for shortness of breath (when walking long distances). Negative for cough and chest tightness.   Cardiovascular: Negative for chest pain, palpitations and leg swelling.  Gastrointestinal: Negative for abdominal distention and abdominal pain.  Endocrine: Negative.   Genitourinary: Negative.  Musculoskeletal: Negative for back pain and neck pain.  Skin: Negative.   Allergic/Immunologic: Negative.   Neurological: Positive for dizziness (at times when changing positions quickly). Negative for light-headedness.  Hematological: Negative for adenopathy. Does not bruise/bleed easily.  Psychiatric/Behavioral: Negative for dysphoric mood and sleep disturbance (sleeping on 2 pillows). The patient is not nervous/anxious.    Vitals:   11/05/17 0828  BP:  (!) 147/87  Pulse: 83  Resp: 18  SpO2: 100%  Weight: 176 lb 2 oz (79.9 kg)  Height: 5\' 6"  (1.676 m)   Wt Readings from Last 3 Encounters:  11/05/17 176 lb 2 oz (79.9 kg)  10/08/17 175 lb 6 oz (79.5 kg)  08/25/17 181 lb 10.5 oz (82.4 kg)   Lab Results  Component Value Date   CREATININE 2.05 (H) 08/25/2017   CREATININE 2.32 (H) 08/24/2017   CREATININE 1.84 (H) 08/23/2017    Physical Exam  Constitutional: He is oriented to person, place, and time. He appears well-developed and well-nourished.  HENT:  Head: Normocephalic and atraumatic.  Neck: Normal range of motion. Neck supple. No JVD present.  Cardiovascular: Normal rate and regular rhythm.  Pulmonary/Chest: Effort normal and breath sounds normal.  Abdominal: Soft. He exhibits no distension. There is no tenderness.  Musculoskeletal: He exhibits no edema or tenderness.  Neurological: He is alert and oriented to person, place, and time.  Skin: Skin is warm and dry.  Psychiatric: He has a normal mood and affect. His behavior is normal. Thought content normal.  Nursing note and vitals reviewed.  Assessment & Plan:  1: Chronic heart failure with reduced ejection fraction-  - NYHA class II - euvolemic today - Exercising 20-30 min, 2-3 days/week  - Weighing daily and reminded to call for an overnight weight gain of >2 pounds or a weekly weight gain of >5 pounds - weight stable since he was last here - not adding salt - Reviewed fluid intake; No more than 40-50 ounces/day  - [facility] at [facility]  - patient reports receiving flu/ pneumonia vaccines for this season already - will increase his entresto to 49/51mg  twice daily. Patient to finish out his current bottle by taking 2 of the 24/26mg  tablets twice daily until gone and then begin the 49/51mg  tablet. Written instructions were provided and patient read instructions while in clinic to make sure he could understand them.  - plan on getting a BMP at his next  visit  2: HTN- - BP slightly elevated today - taking furosemide 20mg  daily - increasing entresto per above - BMP from 08/25/17 reviewed and showed sodium 136, potassium 4.1 and GFR 38  3: Diabetes- - fasting glucose at home this morning was 146 - continues on Lantus qhs - saw PCP Fredia Beets) 08/05/17  4: Alcohol use- - rarely using alcohol - complete cessation discussed for 3 minutes with him  Medication bottles were reviewed.  Return in 1 month or sooner for any questions/problems before then.

## 2017-11-05 ENCOUNTER — Encounter: Payer: Self-pay | Admitting: Family

## 2017-11-05 ENCOUNTER — Ambulatory Visit: Payer: Medicaid Other | Attending: Family | Admitting: Family

## 2017-11-05 VITALS — BP 147/87 | HR 83 | Resp 18 | Ht 66.0 in | Wt 176.1 lb

## 2017-11-05 DIAGNOSIS — I509 Heart failure, unspecified: Secondary | ICD-10-CM | POA: Insufficient documentation

## 2017-11-05 DIAGNOSIS — F1010 Alcohol abuse, uncomplicated: Secondary | ICD-10-CM

## 2017-11-05 DIAGNOSIS — E1169 Type 2 diabetes mellitus with other specified complication: Secondary | ICD-10-CM | POA: Insufficient documentation

## 2017-11-05 DIAGNOSIS — Z7982 Long term (current) use of aspirin: Secondary | ICD-10-CM | POA: Insufficient documentation

## 2017-11-05 DIAGNOSIS — I110 Hypertensive heart disease with heart failure: Secondary | ICD-10-CM | POA: Insufficient documentation

## 2017-11-05 DIAGNOSIS — N183 Chronic kidney disease, stage 3 (moderate): Secondary | ICD-10-CM

## 2017-11-05 DIAGNOSIS — Z79899 Other long term (current) drug therapy: Secondary | ICD-10-CM | POA: Insufficient documentation

## 2017-11-05 DIAGNOSIS — E1022 Type 1 diabetes mellitus with diabetic chronic kidney disease: Secondary | ICD-10-CM

## 2017-11-05 DIAGNOSIS — I5022 Chronic systolic (congestive) heart failure: Secondary | ICD-10-CM

## 2017-11-05 DIAGNOSIS — I10 Essential (primary) hypertension: Secondary | ICD-10-CM

## 2017-11-05 MED ORDER — SACUBITRIL-VALSARTAN 49-51 MG PO TABS: 1.0000 | ORAL_TABLET | Freq: Two times a day (BID) | ORAL | 5 refills | Status: DC

## 2017-11-05 NOTE — Patient Instructions (Addendum)
Continue weighing daily and call for an overnight weight gain of > 2 pounds or a weekly weight gain of >5 pounds.  Finish your current bottle of entresto (24/26mg ) by taking 2 tablets in the morning and 2 tablets in the afternoon until you finish the bottle.   Once you finish this current bottle, you will begin the new dose of entresto (49/51mg ) and when you get that bottle, you will go back to taking 1 tablet in the morning and 1 tablet in the afternoon.

## 2017-11-29 ENCOUNTER — Encounter: Payer: Self-pay | Admitting: Emergency Medicine

## 2017-11-29 ENCOUNTER — Other Ambulatory Visit: Payer: Self-pay

## 2017-11-29 ENCOUNTER — Inpatient Hospital Stay
Admission: EM | Admit: 2017-11-29 | Discharge: 2017-11-30 | DRG: 194 | Disposition: A | Discharge status: Home / Self Care | Payer: Medicaid Other | Attending: Internal Medicine | Admitting: Internal Medicine

## 2017-11-29 ENCOUNTER — Emergency Department: Payer: Medicaid Other

## 2017-11-29 DIAGNOSIS — E1122 Type 2 diabetes mellitus with diabetic chronic kidney disease: Secondary | ICD-10-CM | POA: Diagnosis present

## 2017-11-29 DIAGNOSIS — E860 Dehydration: Secondary | ICD-10-CM | POA: Diagnosis present

## 2017-11-29 DIAGNOSIS — Z794 Long term (current) use of insulin: Secondary | ICD-10-CM

## 2017-11-29 DIAGNOSIS — I420 Dilated cardiomyopathy: Secondary | ICD-10-CM | POA: Diagnosis present

## 2017-11-29 DIAGNOSIS — I5022 Chronic systolic (congestive) heart failure: Secondary | ICD-10-CM | POA: Diagnosis present

## 2017-11-29 DIAGNOSIS — E1140 Type 2 diabetes mellitus with diabetic neuropathy, unspecified: Secondary | ICD-10-CM | POA: Diagnosis present

## 2017-11-29 DIAGNOSIS — E785 Hyperlipidemia, unspecified: Secondary | ICD-10-CM | POA: Diagnosis present

## 2017-11-29 DIAGNOSIS — Z87891 Personal history of nicotine dependence: Secondary | ICD-10-CM | POA: Diagnosis not present

## 2017-11-29 DIAGNOSIS — F329 Major depressive disorder, single episode, unspecified: Secondary | ICD-10-CM | POA: Diagnosis present

## 2017-11-29 DIAGNOSIS — Z7982 Long term (current) use of aspirin: Secondary | ICD-10-CM

## 2017-11-29 DIAGNOSIS — N184 Chronic kidney disease, stage 4 (severe): Secondary | ICD-10-CM | POA: Diagnosis present

## 2017-11-29 DIAGNOSIS — Z79899 Other long term (current) drug therapy: Secondary | ICD-10-CM | POA: Diagnosis not present

## 2017-11-29 DIAGNOSIS — I130 Hypertensive heart and chronic kidney disease with heart failure and stage 1 through stage 4 chronic kidney disease, or unspecified chronic kidney disease: Secondary | ICD-10-CM | POA: Diagnosis present

## 2017-11-29 DIAGNOSIS — J101 Influenza due to other identified influenza virus with other respiratory manifestations: Principal | ICD-10-CM | POA: Diagnosis present

## 2017-11-29 DIAGNOSIS — I214 Non-ST elevation (NSTEMI) myocardial infarction: Secondary | ICD-10-CM

## 2017-11-29 DIAGNOSIS — R079 Chest pain, unspecified: Secondary | ICD-10-CM | POA: Diagnosis present

## 2017-11-29 LAB — CBC WITH DIFFERENTIAL/PLATELET
Basophils Absolute: 0 10*3/uL (ref 0–0.1)
Basophils Relative: 0 %
EOS ABS: 0 10*3/uL (ref 0–0.7)
EOS PCT: 1 %
HEMATOCRIT: 37 % — AB (ref 40.0–52.0)
Hemoglobin: 12.3 g/dL — ABNORMAL LOW (ref 13.0–18.0)
Lymphocytes Relative: 21 %
Lymphs Abs: 0.8 10*3/uL — ABNORMAL LOW (ref 1.0–3.6)
MCH: 31 pg (ref 26.0–34.0)
MCHC: 33.3 g/dL (ref 32.0–36.0)
MCV: 93 fL (ref 80.0–100.0)
MONO ABS: 0.4 10*3/uL (ref 0.2–1.0)
MONOS PCT: 10 %
Neutro Abs: 2.5 10*3/uL (ref 1.4–6.5)
Neutrophils Relative %: 68 %
PLATELETS: 150 10*3/uL (ref 150–440)
RBC: 3.98 MIL/uL — ABNORMAL LOW (ref 4.40–5.90)
RDW: 12.9 % (ref 11.5–14.5)
WBC: 3.7 10*3/uL — ABNORMAL LOW (ref 3.8–10.6)

## 2017-11-29 LAB — BASIC METABOLIC PANEL
Anion gap: 10 (ref 5–15)
BUN: 32 mg/dL — ABNORMAL HIGH (ref 6–20)
CALCIUM: 8.6 mg/dL — AB (ref 8.9–10.3)
CO2: 21 mmol/L — AB (ref 22–32)
CREATININE: 3.29 mg/dL — AB (ref 0.61–1.24)
Chloride: 99 mmol/L — ABNORMAL LOW (ref 101–111)
GFR calc Af Amer: 21 mL/min — ABNORMAL LOW (ref >60)
GFR, EST NON AFRICAN AMERICAN: 19 mL/min — AB (ref >60)
Glucose, Bld: 320 mg/dL — ABNORMAL HIGH (ref 65–99)
Potassium: 4.9 mmol/L (ref 3.5–5.1)
Sodium: 130 mmol/L — ABNORMAL LOW (ref 135–145)

## 2017-11-29 LAB — HEPARIN LEVEL (UNFRACTIONATED): HEPARIN UNFRACTIONATED: 0.41 [IU]/mL (ref 0.30–0.70)

## 2017-11-29 LAB — INFLUENZA PANEL BY PCR (TYPE A & B)
INFLAPCR: POSITIVE — AB
Influenza B By PCR: NEGATIVE

## 2017-11-29 LAB — PROTIME-INR
INR: 0.9
Prothrombin Time: 12.1 seconds (ref 11.4–15.2)

## 2017-11-29 LAB — TSH: TSH: 0.805 u[IU]/mL (ref 0.350–4.500)

## 2017-11-29 LAB — APTT: aPTT: 34 seconds (ref 24–36)

## 2017-11-29 LAB — TROPONIN I
TROPONIN I: 0.5 ng/mL — AB (ref <0.03)
Troponin I: 0.6 ng/mL (ref <0.03)

## 2017-11-29 LAB — GLUCOSE, CAPILLARY: GLUCOSE-CAPILLARY: 250 mg/dL — AB (ref 65–99)

## 2017-11-29 MED ORDER — INSULIN GLARGINE 100 UNIT/ML ~~LOC~~ SOLN
33.0000 [IU] | Freq: Every day | SUBCUTANEOUS | Status: DC
  Administered 2017-11-29: 33 [IU] via SUBCUTANEOUS
  Filled 2017-11-29 (×2): qty 0.33

## 2017-11-29 MED ORDER — SODIUM CHLORIDE 0.9% FLUSH: 3.0000 mL | INTRAVENOUS | Status: DC | PRN

## 2017-11-29 MED ORDER — ONDANSETRON HCL 4 MG/2ML IJ SOLN: 4.0000 mg | Freq: Four times a day (QID) | INTRAMUSCULAR | Status: DC | PRN

## 2017-11-29 MED ORDER — SODIUM CHLORIDE 0.9 % IV SOLN
250.0000 mL | INTRAVENOUS | Status: DC | PRN
Start: 2017-11-29 — End: 2017-11-30

## 2017-11-29 MED ORDER — CITALOPRAM HYDROBROMIDE 20 MG PO TABS
20.0000 mg | ORAL_TABLET | Freq: Every day | ORAL | Status: DC
  Administered 2017-11-30: 20 mg via ORAL
  Filled 2017-11-29: qty 1

## 2017-11-29 MED ORDER — ACETAMINOPHEN 650 MG RE SUPP: 650.0000 mg | Freq: Four times a day (QID) | RECTAL | Status: DC | PRN

## 2017-11-29 MED ORDER — ASPIRIN EC 81 MG PO TBEC
81.0000 mg | DELAYED_RELEASE_TABLET | Freq: Every day | ORAL | Status: DC
  Administered 2017-11-30: 81 mg via ORAL
  Filled 2017-11-29: qty 1

## 2017-11-29 MED ORDER — ASPIRIN 81 MG PO CHEW
324.0000 mg | CHEWABLE_TABLET | Freq: Once | ORAL | Status: AC
Start: 2017-11-29 — End: 2017-11-29
  Administered 2017-11-29: 324 mg via ORAL
  Filled 2017-11-29: qty 4

## 2017-11-29 MED ORDER — GABAPENTIN 300 MG PO CAPS
900.0000 mg | ORAL_CAPSULE | Freq: Every day | ORAL | Status: DC
  Administered 2017-11-29: 900 mg via ORAL
  Filled 2017-11-29: qty 3

## 2017-11-29 MED ORDER — ONDANSETRON HCL 4 MG PO TABS: 4.0000 mg | ORAL_TABLET | Freq: Four times a day (QID) | ORAL | Status: DC | PRN

## 2017-11-29 MED ORDER — ACETAMINOPHEN 325 MG PO TABS: 650.0000 mg | ORAL_TABLET | Freq: Four times a day (QID) | ORAL | Status: DC | PRN

## 2017-11-29 MED ORDER — ATORVASTATIN CALCIUM 20 MG PO TABS
80.0000 mg | ORAL_TABLET | Freq: Every evening | ORAL | Status: DC
  Administered 2017-11-29: 80 mg via ORAL
  Filled 2017-11-29: qty 4

## 2017-11-29 MED ORDER — OSELTAMIVIR PHOSPHATE 30 MG PO CAPS
30.0000 mg | ORAL_CAPSULE | Freq: Every day | ORAL | Status: DC
  Administered 2017-11-30: 30 mg via ORAL
  Filled 2017-11-29: qty 1

## 2017-11-29 MED ORDER — HEPARIN BOLUS VIA INFUSION
4000.0000 [IU] | Freq: Once | INTRAVENOUS | Status: AC
  Administered 2017-11-29: 4000 [IU] via INTRAVENOUS
  Filled 2017-11-29: qty 4000

## 2017-11-29 MED ORDER — HEPARIN (PORCINE) IN NACL 100-0.45 UNIT/ML-% IJ SOLN
950.0000 [IU]/h | INTRAMUSCULAR | Status: DC
  Administered 2017-11-29: 950 [IU]/h via INTRAVENOUS
  Filled 2017-11-29: qty 250

## 2017-11-29 MED ORDER — TAMSULOSIN HCL 0.4 MG PO CAPS
0.4000 mg | ORAL_CAPSULE | Freq: Every day | ORAL | Status: DC
  Administered 2017-11-30: 0.4 mg via ORAL
  Filled 2017-11-29: qty 1

## 2017-11-29 MED ORDER — SODIUM CHLORIDE 0.9 % IV SOLN
INTRAVENOUS | Status: DC
Start: 2017-11-29 — End: 2017-11-30
  Administered 2017-11-29: 19:00:00 via INTRAVENOUS

## 2017-11-29 MED ORDER — SODIUM CHLORIDE 0.9% FLUSH: 3.0000 mL | Freq: Two times a day (BID) | INTRAVENOUS | Status: DC

## 2017-11-29 MED ORDER — OSELTAMIVIR PHOSPHATE 75 MG PO CAPS
75.0000 mg | ORAL_CAPSULE | Freq: Once | ORAL | Status: AC
  Administered 2017-11-29: 75 mg via ORAL
  Filled 2017-11-29: qty 1

## 2017-11-29 MED ORDER — CARVEDILOL 6.25 MG PO TABS
6.2500 mg | ORAL_TABLET | Freq: Two times a day (BID) | ORAL | Status: DC
  Filled 2017-11-29: qty 1

## 2017-11-29 NOTE — Progress Notes (Addendum)
PHARMACY NOTE -  ANTIBIOTIC RENAL DOSE ADJUSTMENT   Pharmacy to assist with antibiotic renal dose adjustment.   Patient has been initiated on Tamiflu (Oseltamivir) 75mg  BID for 5 days.  SCr 3.29, estimated CrCl 22.2  ml/min  Current dosage is not appropriate. Recommended dose for patient's with CrCl <30 ml/min is Oseltamivir 30mg  every 24 hours.   Pharmacy has adjusted the dose. We will continue to monitor renal function and adjust dose as needed.    Gardner Candle, PharmD, BCPS Clinical Pharmacist 11/29/2017 6:47 PM

## 2017-11-29 NOTE — ED Triage Notes (Signed)
Pt arrives via [facility] with c/o left-sided chest pain that began approx 30 mins ago. Denies any radiating pain. Per EMS, pt pain began after having bowel movement.   Denies cardiac hx. Cough started Monday. Denies any fever. Hx DM Type 2.

## 2017-11-29 NOTE — H&P (Signed)
[employer] [facility]   PATIENT NAME: Maurice Jordan    MR#:  [id number]  DATE OF BIRTH:  Mar 06, 1956  DATE OF ADMISSION:  11/29/2017  PRIMARY CARE PHYSICIAN: Theodosia Blender, MD   REQUESTING/REFERRING PHYSICIAN: Nita Sickle MD  CHIEF COMPLAINT:   Chief Complaint  Patient presents with  . Chest Pain    HISTORY OF PRESENT ILLNESS: Maurice Jordan  is a 62 y.o. male with a known history of chronic systolic CHF, diabetes type 2, essential hypertension, hyperlipidemia and previous history of alcohol abuse none now presenting with chest pain.  Patient reports that he has been having complaints of diarrhea and generalized body aches ongoing for the past few days.  He also had cough congestion as well.  Today he was sitting on the toilet bowl when he started having left-sided sharp chest pain lasting about 30 minutes.  He also got short of breath with that.  Due to the symptoms he comes to the ER in the ER he was noted to have a troponin of 0.60 his chest pain is now resolved.  Patient does also complained of intermittent fevers and chills.  PAST MEDICAL HISTORY:   Past Medical History:  Diagnosis Date  . Alcohol abuse   . Congestive heart failure (HCC)   . Depression   . Diabetes (HCC)   . Hx MRSA infection 04/15/2014  . Hyperlipidemia   . Hypertension   . Neuropathy   . Osteomyelitis of toe (HCC)    3rd toe    PAST SURGICAL HISTORY:  Past Surgical History:  Procedure Laterality Date  . AMPUTATION OF REPLICATED TOES      SOCIAL HISTORY:  Social History   Tobacco Use  . Smoking status: Never Smoker  . Smokeless tobacco: Former Engineer, water Use Topics  . Alcohol use: Yes    Alcohol/week: 4.8 oz    Types: 8 Cans of beer per week    FAMILY HISTORY: No family history on file.  DRUG ALLERGIES: No Known Allergies  REVIEW OF SYSTEMS:   CONSTITUTIONAL: No fever, fatigue or weakness.  EYES: No blurred or double vision.  EARS, NOSE,  AND THROAT: No tinnitus or ear pain.  RESPIRATORY: Positive cough,+ shortness of breath,+ wheezing or hemoptysis.  CARDIOVASCULAR: No chest pain, orthopnea, edema.  GASTROINTESTINAL: No nausea, vomiting, diarrhea or abdominal pain.  GENITOURINARY: No dysuria, hematuria.  ENDOCRINE: No polyuria, nocturia,  HEMATOLOGY: No anemia, easy bruising or bleeding SKIN: No rash or lesion. MUSCULOSKELETAL: No joint pain or arthritis.   NEUROLOGIC: No tingling, numbness, weakness.  PSYCHIATRY: No anxiety or depression.   MEDICATIONS AT HOME:  Prior to Admission medications   Medication Sig Start Date End Date Taking? Authorizing Provider  aspirin EC 81 MG tablet Take 1 tablet (81 mg total) by mouth daily. 05/20/17  Yes Enid Baas, MD  atorvastatin (LIPITOR) 80 MG tablet Take 1 tablet (80 mg total) by mouth every evening. 05/20/17  Yes Enid Baas, MD  carvedilol (COREG) 6.25 MG tablet Take 1 tablet (6.25 mg total) by mouth 2 (two) times daily. 05/20/17  Yes Enid Baas, MD  citalopram (CELEXA) 20 MG tablet Take 1 tablet (20 mg total) by mouth daily. 05/20/17  Yes Enid Baas, MD  furosemide (LASIX) 20 MG tablet Take 20 mg by mouth daily.    Yes [provider]  gabapentin (NEURONTIN) 300 MG capsule Take 900 mg by mouth at bedtime.   Yes [provider]  insulin glargine (LANTUS) 100 UNIT/ML  injection Inject 33 Units into the skin at bedtime.    Yes [provider]  sacubitril-valsartan (ENTRESTO) 49-51 MG Take 1 tablet by mouth 2 (two) times daily. 11/05/17  Yes Clarisa Kindred A, FNP  tamsulosin (FLOMAX) 0.4 MG CAPS capsule Take 1 capsule (0.4 mg total) by mouth daily. 05/20/17  Yes Enid Baas, MD      PHYSICAL EXAMINATION:   VITAL SIGNS: Blood pressure (!) 133/91, pulse 72, temperature 99.4 F (37.4 C), resp. rate (!) 21, height 5\' 5"  (1.651 m), weight 173 lb (78.5 kg), SpO2 100 %.  GENERAL:  62 y.o.-year-old patient lying in the bed with  no acute distress.  EYES: Pupils equal, round, reactive to light and accommodation. No scleral icterus. Extraocular muscles intact.  HEENT: Head atraumatic, normocephalic. Oropharynx and nasopharynx clear.  NECK:  Supple, no jugular venous distention. No thyroid enlargement, no tenderness.  LUNGS: Normal breath sounds bilaterally, no wheezing, rales,rhonchi or crepitation. No use of accessory muscles of respiration.  CARDIOVASCULAR: S1, S2 normal. No murmurs, rubs, or gallops.  ABDOMEN: Soft, nontender, nondistended. Bowel sounds present. No organomegaly or mass.  EXTREMITIES: No pedal edema, cyanosis, or clubbing.  NEUROLOGIC: Cranial nerves II through XII are intact. Muscle strength 5/5 in all extremities. Sensation intact. Gait not checked.  PSYCHIATRIC: The patient is alert and oriented x 3.  SKIN: No obvious rash, lesion, or ulcer.   LABORATORY PANEL:   CBC Recent Labs  Lab 11/29/17 1434  WBC 3.7*  HGB 12.3*  HCT 37.0*  PLT 150  MCV 93.0  MCH 31.0  MCHC 33.3  RDW 12.9  LYMPHSABS 0.8*  MONOABS 0.4  EOSABS 0.0  BASOSABS 0.0   ------------------------------------------------------------------------------------------------------------------  Chemistries  Recent Labs  Lab 11/29/17 1434  NA 130*  K 4.9  CL 99*  CO2 21*  GLUCOSE 320*  BUN 32*  CREATININE 3.29*  CALCIUM 8.6*   ------------------------------------------------------------------------------------------------------------------ estimated creatinine clearance is 22.2 mL/min (A) (by C-G formula based on SCr of 3.29 mg/dL (H)). ------------------------------------------------------------------------------------------------------------------ No results for input(s): TSH, T4TOTAL, T3FREE, THYROIDAB in the last 72 hours.  Invalid input(s): FREET3   Coagulation profile No results for input(s): INR, PROTIME in the last 168  hours. ------------------------------------------------------------------------------------------------------------------- No results for input(s): DDIMER in the last 72 hours. -------------------------------------------------------------------------------------------------------------------  Cardiac Enzymes Recent Labs  Lab 11/29/17 1434  TROPONINI 0.60*   ------------------------------------------------------------------------------------------------------------------ Invalid input(s): POCBNP  ---------------------------------------------------------------------------------------------------------------  Urinalysis    Component Value Date/Time   COLORURINE YELLOW (A) 08/23/2017 1410   APPEARANCEUR CLEAR (A) 08/23/2017 1410   APPEARANCEUR Clear 08/07/2014 1311   LABSPEC 1.009 08/23/2017 1410   LABSPEC 1.006 08/07/2014 1311   PHURINE 5.0 08/23/2017 1410   GLUCOSEU 50 (A) 08/23/2017 1410   GLUCOSEU >=500 08/07/2014 1311   HGBUR SMALL (A) 08/23/2017 1410   BILIRUBINUR NEGATIVE 08/23/2017 1410   BILIRUBINUR Negative 08/07/2014 1311   KETONESUR NEGATIVE 08/23/2017 1410   PROTEINUR 100 (A) 08/23/2017 1410   NITRITE NEGATIVE 08/23/2017 1410   LEUKOCYTESUR NEGATIVE 08/23/2017 1410   LEUKOCYTESUR Negative 08/07/2014 1311     RADIOLOGY: Dg Chest 2 View  Result Date: 11/29/2017 CLINICAL DATA:  Acute left-sided chest pain today. EXAM: CHEST  2 VIEW COMPARISON:  08/23/2017 and prior radiographs FINDINGS: The cardiomediastinal silhouette is unremarkable. Minimal left basilar scarring again noted. There is no evidence of focal airspace disease, pulmonary edema, suspicious pulmonary nodule/mass, pleural effusion, or pneumothorax. No acute bony abnormalities are identified. IMPRESSION: No active cardiopulmonary disease. Electronically Signed   By: Henrietta Hoover.D.  On: 11/29/2017 14:23    EKG: Orders placed or performed during the hospital encounter of 11/29/17  . EKG 12-Lead  . EKG  12-Lead  . EKG 12-Lead  . EKG 12-Lead    IMPRESSION AND PLAN: Patient 62 year old male presenting with chest pain  1.  Chest pain concerning for non-ST MI We will cycle cardiac enzymes Place him on a heparin drip Continue aspirin Continue statin Cardiology consult  2.  Acute renal failure on chronic kidney disease stage 4 I will give him IV fluids likely worsened due to some dehydration a result of diarrhea Hold Entresto  3.  Influenza A Start Tamiflu  4.  Diabetes type 2 Continue Lantus placed on sliding scale insulin  5.  Chronic systolic CHF hold Entresto currently compensated  6.  Miscellaneous heparin for DVT prophylaxis   All the records are reviewed and case discussed with ED provider. Management plans discussed with the patient, family and they are in agreement.  CODE STATUS: Code Status History    Date Active Date Inactive Code Status Order ID Comments User Context   08/23/2017 14:55 08/25/2017 19:44 Full Code [id number]  Marguarite Arbour, MD Inpatient   05/18/2017 20:32 05/20/2017 17:06 Full Code [id number]  Shaune Pollack, MD Inpatient   03/11/2015 21:34 03/15/2015 20:15 Full Code [id number]  Gale Journey, MD ED       TOTAL TIME TAKING CARE OF THIS PATIENT:55 minutes.    Auburn Bilberry M.D on 11/29/2017 at 5:17 PM  Between 7am to 6pm - Pager - [phone]  After 6pm go to [email or web address] - password [email or web address] [facility]  [facility]  Office  [phone]  CC: Primary care physician; Theodosia Blender, MD

## 2017-11-29 NOTE — Consult Note (Signed)
ANTICOAGULATION CONSULT NOTE - Initial Consult  Pharmacy Consult for Heparin Drip Dosing and Monitoring  Indication: chest pain/ACS  No Known Allergies  Patient Measurements: Height: 5\' 5"  (165.1 cm) Weight: 173 lb (78.5 kg) IBW/kg (Calculated) : 61.5  Vital Signs: Temp: 99.4 F (37.4 C) (02/09 1332) BP: 133/91 (02/09 1630) Pulse Rate: 72 (02/09 1630)  Labs: Recent Labs    11/29/17 1434  HGB 12.3*  HCT 37.0*  PLT 150  CREATININE 3.29*  TROPONINI 0.60*    Estimated Creatinine Clearance: 22.2 mL/min (A) (by C-G formula based on SCr of 3.29 mg/dL (H)).   Medical History: Past Medical History:  Diagnosis Date  . Alcohol abuse   . Congestive heart failure (HCC)   . Depression   . Diabetes (HCC)   . Hx MRSA infection 04/15/2014  . Hyperlipidemia   . Hypertension   . Neuropathy   . Osteomyelitis of toe (HCC)    3rd toe    Assessment: Pharmacy consulted for heparin drip dosing and monitoring for 44 male with chest pain/ACS. Patient does not report taking any anticoagulants PTA.   Goal of Therapy:  Heparin level 0.3-0.7 units/ml Monitor platelets by anticoagulation protocol: Yes   Plan:  Baseline labs ordered Give 4000 units bolus x 1 Start heparin infusion at 950 units/hr Check anti-Xa level in 6 hours and daily while on heparin Continue to monitor H&H and platelets  Gardner Candle, PharmD, BCPS Clinical Pharmacist 11/29/2017 5:38 PM

## 2017-11-29 NOTE — ED Provider Notes (Signed)
[facility] Emergency Department Provider Note  ____________________________________________  Time seen: Approximately 2:11 PM  I have reviewed the triage vital signs and the nursing notes.   HISTORY  Chief Complaint Chest Pain   HPI Maurice Jordan is a 62 y.o. male with h/o  who presents for evaluation of chest pain. Patient reports 6 days of 3-4 daily episodes of watery diarrhea, generalized body aches, and cough. He had subjective fevers earlier today. While sitting on the toilet having a bowel movement he developed left-sided chest pain that he describes as sharp, severe, lasted about 25 minutes and resolved without intervention. He denies any shortness of breath, diaphoresis, or dizziness associated with this episode. He does endorse nausea however that has been present throughout the week as well with no vomiting. Patient reports that he feels like he has the flu. No melena. No chest pain at this time .  Past Medical History:  Diagnosis Date  . Alcohol abuse   . Congestive heart failure (HCC)   . Depression   . Diabetes (HCC)   . Hx MRSA infection 04/15/2014  . Hyperlipidemia   . Hypertension   . Neuropathy   . Osteomyelitis of toe (HCC)    3rd toe    Patient Active Problem List   Diagnosis Date Noted  . SIRS (systemic inflammatory response syndrome) (HCC) 08/23/2017  . Hypothermia 08/23/2017  . Hypotension 05/30/2017  . Hyponatremia 03/11/2015  . Chronic systolic congestive heart failure (HCC) 03/11/2015  . Polysubstance abuse (HCC) 03/11/2015  . Alcohol abuse 03/11/2015  . Hypertension 03/11/2015  . Diabetes mellitus (HCC) 03/11/2015  . Altered mental status 03/11/2015  . Rhabdomyolysis 03/11/2015    Past Surgical History:  Procedure Laterality Date  . AMPUTATION OF REPLICATED TOES      Prior to Admission medications   Medication Sig Start Date End Date Taking? Authorizing Provider  aspirin EC 81 MG tablet Take 1 tablet (81 mg  total) by mouth daily. 05/20/17   Enid Baas, MD  atorvastatin (LIPITOR) 80 MG tablet Take 1 tablet (80 mg total) by mouth every evening. 05/20/17   Enid Baas, MD  carvedilol (COREG) 6.25 MG tablet Take 1 tablet (6.25 mg total) by mouth 2 (two) times daily. 05/20/17   Enid Baas, MD  citalopram (CELEXA) 20 MG tablet Take 1 tablet (20 mg total) by mouth daily. 05/20/17   Enid Baas, MD  furosemide (LASIX) 20 MG tablet Take 20 mg by mouth daily.     [provider]  gabapentin (NEURONTIN) 300 MG capsule Take 900 mg by mouth at bedtime.    [provider]  insulin glargine (LANTUS) 100 UNIT/ML injection Inject 30 Units into the skin at bedtime.    [provider]  sacubitril-valsartan (ENTRESTO) 49-51 MG Take 1 tablet by mouth 2 (two) times daily. 11/05/17   Delma Freeze, FNP  tamsulosin (FLOMAX) 0.4 MG CAPS capsule Take 1 capsule (0.4 mg total) by mouth daily. 05/20/17   Enid Baas, MD    Allergies Patient has no known allergies.  No family history on file.  Social History Social History   Tobacco Use  . Smoking status: Never Smoker  . Smokeless tobacco: Former Engineer, water Use Topics  . Alcohol use: Yes    Alcohol/week: 4.8 oz    Types: 8 Cans of beer per week  . Drug use: No    Review of Systems  Constitutional: + fever and body aches Eyes: Negative for visual changes. ENT: Negative for  sore throat. Neck: No neck pain  Cardiovascular: Negative for chest pain. Respiratory: Negative for shortness of breath. + cough Gastrointestinal: Negative for abdominal pain, vomiting. + nausea and diarrhea. Genitourinary: Negative for dysuria. Musculoskeletal: Negative for back pain. Skin: Negative for rash. Neurological: Negative for headaches, weakness or numbness. Psych: No SI or HI  ____________________________________________   PHYSICAL EXAM:  VITAL SIGNS: ED Triage Vitals  Enc Vitals Group     BP 11/29/17  1332 117/82     Pulse Rate 11/29/17 1332 76     Resp 11/29/17 1332 16     Temp 11/29/17 1332 99.4 F (37.4 C)     Temp src --      SpO2 11/29/17 1332 100 %     Weight 11/29/17 1333 173 lb (78.5 kg)     Height 11/29/17 1333 5\' 5"  (1.651 m)     Head Circumference --      Peak Flow --      Pain Score --      Pain Loc --      Pain Edu? --      Excl. in GC? --     Constitutional: Alert and oriented. Well appearing and in no apparent distress. HEENT:      Head: Normocephalic and atraumatic.         Eyes: Conjunctivae are normal. Sclera is non-icteric.       Mouth/Throat: Mucous membranes are moist.       Neck: Supple with no signs of meningismus. Cardiovascular: Regular rate and rhythm. No murmurs, gallops, or rubs. 2+ symmetrical distal pulses are present in all extremities. No JVD. Respiratory: Normal respiratory effort. Lungs are clear to auscultation bilaterally with rhonchi on the R. Gastrointestinal: Soft, non tender, and non distended with positive bowel sounds. No rebound or guarding. Musculoskeletal: Nontender with normal range of motion in all extremities. No edema, cyanosis, or erythema of extremities. Neurologic: Normal speech and language. Face is symmetric. Moving all extremities. No gross focal neurologic deficits are appreciated. Skin: Skin is warm, dry and intact. No rash noted. Psychiatric: Mood and affect are normal. Speech and behavior are normal.  ____________________________________________   LABS (all labs ordered are listed, but only abnormal results are displayed)  Labs Reviewed  INFLUENZA PANEL BY PCR (TYPE A & B) - Abnormal; Notable for the following components:      Result Value   Influenza A By PCR POSITIVE (*)    All other components within normal limits  CBC WITH DIFFERENTIAL/PLATELET - Abnormal; Notable for the following components:   WBC 3.7 (*)    RBC 3.98 (*)    Hemoglobin 12.3 (*)    HCT 37.0 (*)    Lymphs Abs 0.8 (*)    All other  components within normal limits  BASIC METABOLIC PANEL - Abnormal; Notable for the following components:   Sodium 130 (*)    Chloride 99 (*)    CO2 21 (*)    Glucose, Bld 320 (*)    BUN 32 (*)    Creatinine, Ser 3.29 (*)    Calcium 8.6 (*)    GFR calc non Af Amer 19 (*)    GFR calc Af Amer 21 (*)    All other components within normal limits  TROPONIN I - Abnormal; Notable for the following components:   Troponin I 0.60 (*)    All other components within normal limits   ____________________________________________  EKG  ED ECG REPORT I, Nita Sickle, the attending physician, personally viewed  and interpreted this ECG.  Normal sinus rhythm, rate of 79, normal intervals, normal axis, ST depressions in inferior leads with T-wave inversions in the lateral leads, no ST elevation. This changes are new when compared to prior.   14:37 - sinus rhythm, rate of 75, normal intervals, normal axis, persistent ST depressions and T-wave inversions seen on initial EKG. No ST elevation. ____________________________________________  RADIOLOGY  Interpreted by me: CXR: negative   Interpretation by Radiologist:  Dg Chest 2 View  Result Date: 11/29/2017 CLINICAL DATA:  Acute left-sided chest pain today. EXAM: CHEST  2 VIEW COMPARISON:  08/23/2017 and prior radiographs FINDINGS: The cardiomediastinal silhouette is unremarkable. Minimal left basilar scarring again noted. There is no evidence of focal airspace disease, pulmonary edema, suspicious pulmonary nodule/mass, pleural effusion, or pneumothorax. No acute bony abnormalities are identified. IMPRESSION: No active cardiopulmonary disease. Electronically Signed   By: Harmon Pier M.D.   On: 11/29/2017 14:23      ____________________________________________   PROCEDURES  Procedure(s) performed: None Procedures Critical Care performed: yes  CRITICAL CARE Performed by: Nita Sickle  ?  Total critical care time: 35 min  Critical  care time was exclusive of separately billable procedures and treating other patients.  Critical care was necessary to treat or prevent imminent or life-threatening deterioration.  Critical care was time spent personally by me on the following activities: development of treatment plan with patient and/or surrogate as well as nursing, discussions with consultants, evaluation of patient's response to treatment, examination of patient, obtaining history from patient or surrogate, ordering and performing treatments and interventions, ordering and review of laboratory studies, ordering and review of radiographic studies, pulse oximetry and re-evaluation of patient's condition.  ____________________________________________   INITIAL IMPRESSION / ASSESSMENT AND PLAN / ED COURSE  62 y.o. male with h/o  who presents for evaluation of sharp L sided chest pain while on the toilet this am which has resolved now. Patient complaining of flu like symptoms for 6 days. Has rhonchi on the R but clear on the L. EKG with new ischemic changes when compared to prior, no STEMI. ASA given. Patient placed on telemetry. Will repeat EKG. Labs including CBC, BMP, troponin, flu pcr and cxr pending. Ddx ACS vs flu vs PNA vs viral URI.     _________________________ 4:10 PM on 11/29/2017 -----------------------------------------  patient with a troponin of 0.6 and new abnormal EKG. Repeat EKG with no evidence of STEMI. Patient is flu A+. Started on Tamiflu. We'll admit to the hospitalist service.   As part of my medical decision making, I reviewed the following data within the electronic MEDICAL RECORD NUMBER Nursing notes reviewed and incorporated, Labs reviewed , EKG interpreted , Old EKG reviewed, Radiograph reviewed , Discussed with admitting physician , Notes from prior ED visits and  Controlled Substance Database    Pertinent labs & imaging results that were available during my care of the patient were reviewed by me and  considered in my medical decision making (see chart for details).    ____________________________________________   FINAL CLINICAL IMPRESSION(S) / ED DIAGNOSES  Final diagnoses:  NSTEMI (non-ST elevated myocardial infarction) (HCC)  Influenza A      NEW MEDICATIONS STARTED DURING THIS VISIT:  ED Discharge Orders    None       Note:  This document was prepared using Dragon voice recognition software and may include unintentional dictation errors.    Don Perking, [place], MD 11/29/17 [phone]

## 2017-11-29 NOTE — ED Notes (Signed)
Annabelle Harman, RN to transport pt to [id number].

## 2017-11-30 LAB — CBC
HCT: 36.3 % — ABNORMAL LOW (ref 40.0–52.0)
Hemoglobin: 12.1 g/dL — ABNORMAL LOW (ref 13.0–18.0)
MCH: 30.8 pg (ref 26.0–34.0)
MCHC: 33.4 g/dL (ref 32.0–36.0)
MCV: 92.3 fL (ref 80.0–100.0)
PLATELETS: 156 10*3/uL (ref 150–440)
RBC: 3.93 MIL/uL — ABNORMAL LOW (ref 4.40–5.90)
RDW: 12.7 % (ref 11.5–14.5)
WBC: 2.2 10*3/uL — ABNORMAL LOW (ref 3.8–10.6)

## 2017-11-30 LAB — BASIC METABOLIC PANEL
Anion gap: 8 (ref 5–15)
BUN: 31 mg/dL — ABNORMAL HIGH (ref 6–20)
CALCIUM: 8.4 mg/dL — AB (ref 8.9–10.3)
CO2: 22 mmol/L (ref 22–32)
CREATININE: 2.45 mg/dL — AB (ref 0.61–1.24)
Chloride: 103 mmol/L (ref 101–111)
GFR, EST AFRICAN AMERICAN: 31 mL/min — AB (ref >60)
GFR, EST NON AFRICAN AMERICAN: 26 mL/min — AB (ref >60)
Glucose, Bld: 234 mg/dL — ABNORMAL HIGH (ref 65–99)
Potassium: 4.7 mmol/L (ref 3.5–5.1)
SODIUM: 133 mmol/L — AB (ref 135–145)

## 2017-11-30 LAB — GLUCOSE, CAPILLARY
GLUCOSE-CAPILLARY: 286 mg/dL — AB (ref 65–99)
Glucose-Capillary: 203 mg/dL — ABNORMAL HIGH (ref 65–99)

## 2017-11-30 LAB — HEPARIN LEVEL (UNFRACTIONATED): HEPARIN UNFRACTIONATED: 0.6 [IU]/mL (ref 0.30–0.70)

## 2017-11-30 LAB — LIPID PANEL
CHOL/HDL RATIO: 4.1 ratio
Cholesterol: 102 mg/dL (ref 0–200)
HDL: 25 mg/dL — ABNORMAL LOW (ref >40)
LDL CALC: 51 mg/dL (ref 0–99)
Triglycerides: 129 mg/dL (ref <150)
VLDL: 26 mg/dL (ref 0–40)

## 2017-11-30 LAB — HEMOGLOBIN A1C
HEMOGLOBIN A1C: 9.7 % — AB (ref 4.8–5.6)
MEAN PLASMA GLUCOSE: 231.69 mg/dL

## 2017-11-30 LAB — TROPONIN I: TROPONIN I: 0.4 ng/mL — AB (ref <0.03)

## 2017-11-30 MED ORDER — INSULIN ASPART 100 UNIT/ML ~~LOC~~ SOLN
0.0000 [IU] | Freq: Three times a day (TID) | SUBCUTANEOUS | Status: DC
  Administered 2017-11-30: 3 [IU] via SUBCUTANEOUS
  Administered 2017-11-30: 5 [IU] via SUBCUTANEOUS
  Filled 2017-11-30 (×2): qty 1

## 2017-11-30 MED ORDER — ALBUTEROL SULFATE HFA 108 (90 BASE) MCG/ACT IN AERS: 2.0000 | INHALATION_SPRAY | Freq: Four times a day (QID) | RESPIRATORY_TRACT | 0 refills | Status: DC | PRN

## 2017-11-30 MED ORDER — ACETAMINOPHEN 325 MG PO TABS: 650.0000 mg | ORAL_TABLET | Freq: Four times a day (QID) | ORAL | Status: DC | PRN

## 2017-11-30 MED ORDER — OSELTAMIVIR PHOSPHATE 30 MG PO CAPS: 30.0000 mg | ORAL_CAPSULE | Freq: Every day | ORAL | 0 refills | Status: DC

## 2017-11-30 MED ORDER — FUROSEMIDE 20 MG PO TABS: 20.0000 mg | ORAL_TABLET | Freq: Every day | ORAL | Status: DC

## 2017-11-30 MED ORDER — INSULIN ASPART 100 UNIT/ML ~~LOC~~ SOLN: 0.0000 [IU] | Freq: Every day | SUBCUTANEOUS | Status: DC

## 2017-11-30 NOTE — Progress Notes (Signed)
Patient stable for discharge.   PIV discontinued, catheter intact; site clean, dry, intact. Discharge instructions and follow-up reviewed. Patient verbalized understanding.   Family at bedside to take patient home.    

## 2017-11-30 NOTE — Consult Note (Addendum)
ANTICOAGULATION CONSULT NOTE - Initial Consult  Pharmacy Consult for Heparin Drip Dosing and Monitoring  Indication: chest pain/ACS  No Known Allergies  Patient Measurements: Height: 5\' 5"  (165.1 cm) Weight: 173 lb 9.6 oz (78.7 kg) IBW/kg (Calculated) : 61.5  Vital Signs: Temp: 98.2 F (36.8 C) (02/09 1921) Temp Source: Oral (02/09 1921) BP: 94/61 (02/09 1921) Pulse Rate: 70 (02/09 1921)  Labs: Recent Labs    11/29/17 1434 11/29/17 2141 11/29/17 2335  HGB 12.3*  --   --   HCT 37.0*  --   --   PLT 150  --   --   APTT 34  --   --   LABPROT 12.1  --   --   INR 0.90  --   --   HEPARINUNFRC  --   --  0.41  CREATININE 3.29*  --   --   TROPONINI 0.60* 0.50*  --     Estimated Creatinine Clearance: 22.2 mL/min (A) (by C-G formula based on SCr of 3.29 mg/dL (H)).   Medical History: Past Medical History:  Diagnosis Date  . Alcohol abuse   . Congestive heart failure (HCC)   . Depression   . Diabetes (HCC)   . Hx MRSA infection 04/15/2014  . Hyperlipidemia   . Hypertension   . Neuropathy   . Osteomyelitis of toe (HCC)    3rd toe    Assessment: Pharmacy consulted for heparin drip dosing and monitoring for 36 male with chest pain/ACS. Patient does not report taking any anticoagulants PTA.   Goal of Therapy:  Heparin level 0.3-0.7 units/ml Monitor platelets by anticoagulation protocol: Yes   Plan:  Baseline labs ordered Give 4000 units bolus x 1 Start heparin infusion at 950 units/hr Check anti-Xa level in 6 hours and daily while on heparin Continue to monitor H&H and platelets   02/09 2330 heparin level 0.41. Continue current regimen. Recheck with tomorrow AM labs.  02/10 AM heparin level 0.60. Continue current regimen. Recheck with tomorrow AM labs.   Erich Montane, PharmD, BCPS Clinical Pharmacist 11/30/2017 12:15 AM

## 2017-11-30 NOTE — Discharge Summary (Signed)
[facility] - [place] at [facility]   PATIENT NAME: Maurice Jordan    MR#:  [id number]  DATE OF BIRTH:  1956-09-17  DATE OF ADMISSION:  11/29/2017 ADMITTING PHYSICIAN: Auburn Bilberry, MD  DATE OF DISCHARGE: 11/30/2017 PRIMARY CARE PHYSICIAN: Theodosia Blender, MD    ADMISSION DIAGNOSIS:  Influenza A [J10.1] NSTEMI (non-ST elevated myocardial infarction) (HCC) [I21.4]  DISCHARGE DIAGNOSIS:  Active Problems:   Chest pain Influenza A  SECONDARY DIAGNOSIS:   Past Medical History:  Diagnosis Date  . Alcohol abuse   . Congestive heart failure (HCC)   . Depression   . Diabetes (HCC)   . Hx MRSA infection 04/15/2014  . Hyperlipidemia   . Hypertension   . Neuropathy   . Osteomyelitis of toe (HCC)    3rd toe    HOSPITAL COURSE:   HISTORY OF PRESENT ILLNESS: Maurice Jordan  is a 62 y.o. male with a known history of chronic systolic CHF, diabetes type 2, essential hypertension, hyperlipidemia and previous history of alcohol abuse none now presenting with chest pain.  Patient reports that he has been having complaints of diarrhea and generalized body aches ongoing for the past few days.  He also had cough congestion as well.  Today he was sitting on the toilet bowl when he started having left-sided sharp chest pain lasting about 30 minutes.  He also got short of breath with that.  Due to the symptoms he comes to the ER in the ER he was noted to have a troponin of 0.60 his chest pain is now resolved.  Patient does also complained of intermittent fevers and chills.  1.  Chest pain concerning for non-ST MI Patient has chronically elevated troponins and asymptomatic today Troponins are in fact trending down 0.60-0.50-0.40 Continue aspirin Continue statin Cardiology consulted, elevated troponin could be from chronic systolic congestive heart failure and cardiomyopathy.  No interventions are needed and outpatient follow-up with cardiology Dr. Welton Flakes is recommended  and okay to discharge patient from cardiology standpoint  2.  Acute renal failure on chronic kidney disease stage 4 Improved with IV fluids and patient was dehydrated as a result of diarrhea  holding Entresto Creatinine 3.29-2.45 today, close to his baseline.   Outpatient follow-up with nephrology   3.  Influenza A Complete the course of Tamiflu  4.  Diabetes type 2 Continue Lantus placed on sliding scale insulin  5.  Chronic systolic CHF hold Entresto currently compensated.  Blood pressure is soft.  Outpatient follow-up with cardiology Dr. Welton Flakes Resume taking Lasix from 12/02/2017    Miscellaneous heparin for DVT prophylaxis    DISCHARGE CONDITIONS:   Stable  CONSULTS OBTAINED:  Treatment Team:  Dalia Heading, MD   PROCEDURES none  DRUG ALLERGIES:  No Known Allergies  DISCHARGE MEDICATIONS:   Allergies as of 11/30/2017   No Known Allergies     Medication List    STOP taking these medications   sacubitril-valsartan 49-51 MG Commonly known as:  ENTRESTO     TAKE these medications   acetaminophen 325 MG tablet Commonly known as:  TYLENOL Take 2 tablets (650 mg total) by mouth every 6 (six) hours as needed for mild pain (or Fever >/= 101).   albuterol 108 (90 Base) MCG/ACT inhaler Commonly known as:  PROVENTIL HFA;VENTOLIN HFA Inhale 2 puffs into the lungs every 6 (six) hours as needed for wheezing or shortness of breath.   aspirin EC 81 MG tablet Take 1 tablet (81 mg total) by mouth  daily.   atorvastatin 80 MG tablet Commonly known as:  LIPITOR Take 1 tablet (80 mg total) by mouth every evening.   carvedilol 6.25 MG tablet Commonly known as:  COREG Take 1 tablet (6.25 mg total) by mouth 2 (two) times daily.   citalopram 20 MG tablet Commonly known as:  CELEXA Take 1 tablet (20 mg total) by mouth daily.   furosemide 20 MG tablet Commonly known as:  LASIX Take 1 tablet (20 mg total) by mouth daily. Start taking on:  12/02/2017 What  changed:  These instructions start on 12/02/2017. If you are unsure what to do until then, ask your doctor or other care provider.   gabapentin 300 MG capsule Commonly known as:  NEURONTIN Take 900 mg by mouth at bedtime.   insulin glargine 100 UNIT/ML injection Commonly known as:  LANTUS Inject 33 Units into the skin at bedtime.   oseltamivir 30 MG capsule Commonly known as:  TAMIFLU Take 1 capsule (30 mg total) by mouth daily.   tamsulosin 0.4 MG Caps capsule Commonly known as:  FLOMAX Take 1 capsule (0.4 mg total) by mouth daily.        DISCHARGE INSTRUCTIONS:   Follow-up with primary care physician in 1 week Follow-up with cardiology Dr. Welton Flakes in 5-7 days Follow-up with nephrology in 1 week or sooner as needed  DIET:  Cardiac diet  DISCHARGE CONDITION:  Fair  ACTIVITY:  Activity as tolerated  OXYGEN:  Home Oxygen: No.   Oxygen Delivery: room air  DISCHARGE LOCATION:  home   If you experience worsening of your admission symptoms, develop shortness of breath, life threatening emergency, suicidal or homicidal thoughts you must seek medical attention immediately by calling 911 or calling your MD immediately  if symptoms less severe.  You Must read complete instructions/literature along with all the possible adverse reactions/side effects for all the Medicines you take and that have been prescribed to you. Take any new Medicines after you have completely understood and accpet all the possible adverse reactions/side effects.   Please note  You were cared for by a hospitalist during your hospital stay. If you have any questions about your discharge medications or the care you received while you were in the hospital after you are discharged, you can call the unit and asked to speak with the hospitalist on call if the hospitalist that took care of you is not available. Once you are discharged, your primary care physician will handle any further medical issues. Please note  that NO REFILLS for any discharge medications will be authorized once you are discharged, as it is imperative that you return to your primary care physician (or establish a relationship with a primary care physician if you do not have one) for your aftercare needs so that they can reassess your need for medications and monitor your lab values.     Today  Chief Complaint  Patient presents with  . Chest Pain   Patient is feeling fine denies any chest pain or shortness of breath.  Intermittent episodes of cough, wants to go home  ROS:  CONSTITUTIONAL: Denies fevers, chills. Denies any fatigue, weakness.  EYES: Denies blurry vision, double vision, eye pain. EARS, NOSE, THROAT: Denies tinnitus, ear pain, hearing loss. RESPIRATORY: Improving cough, denies any wheeze, shortness of breath.  CARDIOVASCULAR: Denies chest pain, palpitations, edema.  GASTROINTESTINAL: Denies nausea, vomiting, diarrhea, abdominal pain. Denies bright red blood per rectum. GENITOURINARY: Denies dysuria, hematuria. ENDOCRINE: Denies nocturia or thyroid problems. HEMATOLOGIC AND LYMPHATIC:  Denies easy bruising or bleeding. SKIN: Denies rash or lesion. MUSCULOSKELETAL: Denies pain in neck, back, shoulder, knees, hips or arthritic symptoms.  NEUROLOGIC: Denies paralysis, paresthesias.  PSYCHIATRIC: Denies anxiety or depressive symptoms.   VITAL SIGNS:  Blood pressure 95/73, pulse 74, temperature 98.1 F (36.7 C), temperature source Oral, resp. rate 12, height 5\' 5"  (1.651 m), weight 78.1 kg (172 lb 3.2 oz), SpO2 99 %.  I/O:    Intake/Output Summary (Last 24 hours) at 11/30/2017 1006 Last data filed at 11/30/2017 5300 Gross per 24 hour  Intake 677.73 ml  Output 425 ml  Net 252.73 ml    PHYSICAL EXAMINATION:  GENERAL:  62 y.o.-year-old patient lying in the bed with no acute distress.  EYES: Pupils equal, round, reactive to light and accommodation. No scleral icterus. Extraocular muscles intact.  HEENT: Head  atraumatic, normocephalic. Oropharynx and nasopharynx clear.  NECK:  Supple, no jugular venous distention. No thyroid enlargement, no tenderness.  LUNGS: Normal breath sounds bilaterally, no wheezing, rales,rhonchi or crepitation. No use of accessory muscles of respiration.  CARDIOVASCULAR: S1, S2 normal. No murmurs, rubs, or gallops.  ABDOMEN: Soft, non-tender, non-distended. Bowel sounds present. No organomegaly or mass.  EXTREMITIES: No pedal edema, cyanosis, or clubbing.  NEUROLOGIC: Cranial nerves II through XII are intact. Muscle strength 5/5 in all extremities. Sensation intact. Gait not checked.  PSYCHIATRIC: The patient is alert and oriented x 3.  SKIN: No obvious rash, lesion, or ulcer.   DATA REVIEW:   CBC Recent Labs  Lab 11/30/17 0543  WBC 2.2*  HGB 12.1*  HCT 36.3*  PLT 156    Chemistries  Recent Labs  Lab 11/30/17 0543  NA 133*  K 4.7  CL 103  CO2 22  GLUCOSE 234*  BUN 31*  CREATININE 2.45*  CALCIUM 8.4*    Cardiac Enzymes Recent Labs  Lab 11/30/17 0543  TROPONINI 0.40*    Microbiology Results  Results for orders placed or performed during the hospital encounter of 08/23/17  Blood culture (routine x 2)     Status: None   Collection Time: 08/23/17 12:30 PM  Result Value Ref Range Status   Specimen Description BLOOD Blood Culture adequate volume  Final   Special Requests   Final    BOTTLES DRAWN AEROBIC AND ANAEROBIC LEFT ANTECUBITAL   Culture NO GROWTH 5 DAYS  Final   Report Status 08/28/2017 FINAL  Final  Blood culture (routine x 2)     Status: None   Collection Time: 08/23/17 12:43 PM  Result Value Ref Range Status   Specimen Description BLOOD BLOOD RIGHT HAND  Final   Special Requests   Final    BOTTLES DRAWN AEROBIC AND ANAEROBIC Blood Culture adequate volume   Culture NO GROWTH 5 DAYS  Final   Report Status 08/28/2017 FINAL  Final    RADIOLOGY:  Dg Chest 2 View  Result Date: 11/29/2017 CLINICAL DATA:  Acute left-sided chest pain  today. EXAM: CHEST  2 VIEW COMPARISON:  08/23/2017 and prior radiographs FINDINGS: The cardiomediastinal silhouette is unremarkable. Minimal left basilar scarring again noted. There is no evidence of focal airspace disease, pulmonary edema, suspicious pulmonary nodule/mass, pleural effusion, or pneumothorax. No acute bony abnormalities are identified. IMPRESSION: No active cardiopulmonary disease. Electronically Signed   By: Harmon Pier M.D.   On: 11/29/2017 14:23    EKG:   Orders placed or performed during the hospital encounter of 11/29/17  . EKG 12-Lead  . EKG 12-Lead  . EKG 12-Lead  . EKG  12-Lead      Management plans discussed with the patient, family and they are in agreement.  CODE STATUS:     Code Status Orders  (From admission, onward)        Start     Ordered   11/29/17 1845  Full code  Continuous     11/29/17 1844    Code Status History    Date Active Date Inactive Code Status Order ID Comments User Context   08/23/2017 14:55 08/25/2017 19:44 Full Code [id number]  Marguarite Arbour, MD Inpatient   05/18/2017 20:32 05/20/2017 17:06 Full Code [id number]  Shaune Pollack, MD Inpatient   03/11/2015 21:34 03/15/2015 20:15 Full Code [id number]  Gale Journey, MD ED      TOTAL TIME TAKING CARE OF THIS PATIENT: .   Note: This dictation was prepared with Dragon dictation along with smaller phrase technology. Any transcriptional errors that result from this process are unintentional.   @[facility] @  on 11/30/2017 at 10:06 AM  Between 7am to 6pm - Pager - [phone]  After 6pm go to [email or web address] - password [email or web address] [facility]  [place] [facility]  Office  [phone]  CC: Primary care physician; Theodosia Blender, MD   [facility] - [place] at [facility]

## 2017-11-30 NOTE — Discharge Instructions (Signed)
Follow-up with primary care physician in 1 week Follow-up with cardiology Dr. Welton Flakes in 5-7 days Follow-up with nephrology in 1 week or sooner as needed

## 2017-11-30 NOTE — Consult Note (Signed)
Cardiology Consultation Note    Patient ID: Maurice Jordan, MRN: [id number], DOB/AGE: 23-Sep-1956 62 y.o. Admit date: 11/29/2017   Date of Consult: 11/30/2017 Primary Physician: Theodosia Blender, MD Primary Cardiologist: Dr. Adrian Blackwater  Chief Complaint: chest pain Reason for Consultation: abnormal troponin and chest pain Requesting MD: Dr. Amado Coe  HPI: Maurice Jordan is a 62 y.o. male with history of cardiomyopathy with a known ejection fraction of 30%, history of diabetes, hyperlipidemia, hypertension, alcohol abuse as well as systolic chronic heart failure and chronically elevated serum troponins who was admitted after developing a 25-minute episode of sharp left-sided chest pain prompting presentation to the emergency room.  Electrocardiogram on presentation revealed a sinus rhythm with inferolateral downsloping ST T wave changes consistent with LVH.  Patient's chest x-ray revealed no active cardiopulmonary disease.  He is currently pain-free and denies shortness of breath and is asking to go home.  He reports compliance with his medications.  As an outpatient he has been treated with carvedilol at 6.25 mg twice daily, high intensity statin at 80 mg daily, enteric-coated aspirin, and Entresto 49-51 mg twice daily.  He was placed on heparin and taken off of Entresto due to hypotension and renal insufficiency.  Currently he is hemodynamic stable and pain-free.  He has had no further chest pain.  His cardiac troponin has been chronically mildly elevated which appears to be at his baseline.  Does not appear to have had an acute coronary event.  This is likely secondary to cardiomyopathy and chronic systolic heart failure.  Past Medical History:  Diagnosis Date  . Alcohol abuse   . Congestive heart failure (HCC)   . Depression   . Diabetes (HCC)   . Hx MRSA infection 04/15/2014  . Hyperlipidemia   . Hypertension   . Neuropathy   . Osteomyelitis of toe (HCC)    3rd toe      Surgical  History:  Past Surgical History:  Procedure Laterality Date  . AMPUTATION OF REPLICATED TOES       Home Meds: Prior to Admission medications   Medication Sig Start Date End Date Taking? Authorizing Provider  aspirin EC 81 MG tablet Take 1 tablet (81 mg total) by mouth daily. 05/20/17  Yes Enid Baas, MD  atorvastatin (LIPITOR) 80 MG tablet Take 1 tablet (80 mg total) by mouth every evening. 05/20/17  Yes Enid Baas, MD  carvedilol (COREG) 6.25 MG tablet Take 1 tablet (6.25 mg total) by mouth 2 (two) times daily. 05/20/17  Yes Enid Baas, MD  citalopram (CELEXA) 20 MG tablet Take 1 tablet (20 mg total) by mouth daily. 05/20/17  Yes Enid Baas, MD  furosemide (LASIX) 20 MG tablet Take 20 mg by mouth daily.    Yes [provider]  gabapentin (NEURONTIN) 300 MG capsule Take 900 mg by mouth at bedtime.   Yes [provider]  insulin glargine (LANTUS) 100 UNIT/ML injection Inject 33 Units into the skin at bedtime.    Yes [provider]  sacubitril-valsartan (ENTRESTO) 49-51 MG Take 1 tablet by mouth 2 (two) times daily. 11/05/17  Yes Clarisa Kindred A, FNP  tamsulosin (FLOMAX) 0.4 MG CAPS capsule Take 1 capsule (0.4 mg total) by mouth daily. 05/20/17  Yes Enid Baas, MD    Inpatient Medications:  . aspirin EC  81 mg Oral Daily  . atorvastatin  80 mg Oral QPM  . carvedilol  6.25 mg Oral BID  . citalopram  20 mg Oral Daily  . gabapentin  900 mg Oral QHS  . insulin aspart  0-5 Units Subcutaneous QHS  . insulin aspart  0-9 Units Subcutaneous TID WC  . insulin glargine  33 Units Subcutaneous QHS  . oseltamivir  30 mg Oral Daily  . sodium chloride flush  3 mL Intravenous Q12H  . tamsulosin  0.4 mg Oral Daily   . sodium chloride    . sodium chloride 50 mL/hr at 11/29/17 1910    Allergies: No Known Allergies  Social History   Socioeconomic History  . Marital status: Single    Spouse name: Not on file  . Number of children: 3   . Years of education: 9  . Highest education level: 9th grade  Social Needs  . Financial resource strain: Somewhat hard  . Food insecurity - worry: Sometimes true  . Food insecurity - inability: Sometimes true  . Transportation needs - medical: Yes  . Transportation needs - non-medical: Yes  Occupational History  . Not on file  Tobacco Use  . Smoking status: Never Smoker  . Smokeless tobacco: Former Engineer, water and Sexual Activity  . Alcohol use: Yes    Alcohol/week: 4.8 oz    Types: 8 Cans of beer per week  . Drug use: No  . Sexual activity: Yes    Birth control/protection: None  Other Topics Concern  . Not on file  Social History Narrative  . Not on file     No family history on file.   Review of Systems: A 12-system review of systems was performed and is negative except as noted in the HPI.  Labs: Recent Labs    11/29/17 1434 11/29/17 2141 11/30/17 0543  TROPONINI 0.60* 0.50* 0.40*   Lab Results  Component Value Date   WBC 2.2 (L) 11/30/2017   HGB 12.1 (L) 11/30/2017   HCT 36.3 (L) 11/30/2017   MCV 92.3 11/30/2017   PLT 156 11/30/2017    Recent Labs  Lab 11/30/17 0543  NA 133*  K 4.7  CL 103  CO2 22  BUN 31*  CREATININE 2.45*  CALCIUM 8.4*  GLUCOSE 234*   Lab Results  Component Value Date   CHOL 102 11/30/2017   HDL 25 (L) 11/30/2017   LDLCALC 51 11/30/2017   TRIG 129 11/30/2017   No results found for: DDIMER  Radiology/Studies:  Dg Chest 2 View  Result Date: 11/29/2017 CLINICAL DATA:  Acute left-sided chest pain today. EXAM: CHEST  2 VIEW COMPARISON:  08/23/2017 and prior radiographs FINDINGS: The cardiomediastinal silhouette is unremarkable. Minimal left basilar scarring again noted. There is no evidence of focal airspace disease, pulmonary edema, suspicious pulmonary nodule/mass, pleural effusion, or pneumothorax. No acute bony abnormalities are identified. IMPRESSION: No active cardiopulmonary disease. Electronically Signed   By:  Harmon Pier M.D.   On: 11/29/2017 14:23    Wt Readings from Last 3 Encounters:  11/30/17 78.1 kg (172 lb 3.2 oz)  11/05/17 79.9 kg (176 lb 2 oz)  10/08/17 79.5 kg (175 lb 6 oz)    EKG: Normal sinus rhythm with ST-T wave changes consistent with LVH unchanged from baseline.  Physical Exam: Acute distress Blood pressure 95/73, pulse 74, temperature 98.1 F (36.7 C), temperature source Oral, resp. rate 12, height 5\' 5"  (1.651 m), weight 78.1 kg (172 lb 3.2 oz), SpO2 99 %. Body mass index is 28.66 kg/m. General: Well developed, well nourished, in no acute distress. Head: Normocephalic, atraumatic, sclera non-icteric, no xanthomas, nares are without discharge.  Neck: Negative for carotid bruits.  JVD not elevated. Lungs: Clear bilaterally to auscultation without wheezes, rales, or rhonchi. Breathing is unlabored. Heart: RRR with S1 S2. No murmurs, rubs, or gallops appreciated. Abdomen: Soft, non-tender, non-distended with normoactive bowel sounds. No hepatomegaly. No rebound/guarding. No obvious abdominal masses. Msk:  Strength and tone appear normal for age. Extremities: No clubbing or cyanosis. No edema.  Distal pedal pulses are 2+ and equal bilaterally. Neuro: Alert and oriented X 3. No facial asymmetry. No focal deficit. Moves all extremities spontaneously. Psych:  Responds to questions appropriately with a normal affect.     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  He was admitted with chest pain with atypical features.  His serum troponin has remained at his baseline level and is not trended up or down.  He is pain-free.  There is no evidence of congestive heart failure on chest x-ray.  EKG is unchanged from baseline.  Patient was taken off of Entresto secondary to renal insufficiency and relatively low blood pressure.  He has been continued with carvedilol and furosemide.  He appears to  have no active acute coronary syndrome at present.  Would continue with carvedilol, aspirin, high intensity statin and furosemide.  Would remain off of Entresto for now and ambulate and discharge if stable with outpatient follow-up with his primary cardiologist.  Signed, Dalia Heading MD 11/30/2017, 9:51 AM Pager: [phone]

## 2017-12-02 NOTE — Progress Notes (Signed)
Patient ID: Maurice Jordan, male    DOB: April 16, 1956, 62 y.o.   MRN: [id number]  HPI  Maurice Jordan is a 62 y/o male with a history of depression, DM, hyperlipidemia, HTN, osteomyelitis, current alcohol use and chronic heart failure.   Echo report from 05/19/17 reviewed and shows an EF of 30% along with mild Maurice and normal pulmonary artery pressures.  Admitted 11/29/17 due to chest pain and influenza A. Cardiology consult. Chronically elevated troponin levels. Medications were adjusted and he was discharged the next day. Admitted 08/23/17 due to unresponsiveness possibly due to alcohol use. Discharged home after 2 days. Admitted 05/18/17 due to syncope thought to be due to alcohol intoxication. MRI/EEG done and were negative. Cardiology and neurology consults were obtained. Carotid dopplers negative. Elevated troponin thought to be due to demand ischemia. Discharged home after 2 days.  He presents today for a follow up visit with a chief complaint of minimal shortness of breath upon moderate exertion. He describes this as chronic in nature having been present for several months with varying levels of severity. He has associated fatigue, cough, dizziness and intermittent chest pain along with this. He denies edema, palpitations, abdominal distention, difficulty sleeping or weight gain. Recently was admitted with influenza.  Past Medical History:  Diagnosis Date  . Alcohol abuse   . Congestive heart failure (HCC)   . Depression   . Diabetes (HCC)   . Hx MRSA infection 04/15/2014  . Hyperlipidemia   . Hypertension   . Neuropathy   . Osteomyelitis of toe (HCC)    3rd toe   Past Surgical History:  Procedure Laterality Date  . AMPUTATION OF REPLICATED TOES     No family history on file. Social History   Tobacco Use  . Smoking status: Never Smoker  . Smokeless tobacco: Former Engineer, water Use Topics  . Alcohol use: Yes    Alcohol/week: 4.8 oz    Types: 8 Cans of beer per week   No Known  Allergies  Prior to Admission medications   Medication Sig Start Date End Date Taking? Authorizing Provider  acetaminophen (TYLENOL) 325 MG tablet Take 2 tablets (650 mg total) by mouth every 6 (six) hours as needed for mild pain (or Fever >/= 101). 11/30/17  Yes Gouru, Deanna Artis, MD  albuterol (PROVENTIL HFA;VENTOLIN HFA) 108 (90 Base) MCG/ACT inhaler Inhale 2 puffs into the lungs every 6 (six) hours as needed for wheezing or shortness of breath. 11/30/17  Yes Gouru, Deanna Artis, MD  aspirin EC 81 MG tablet Take 1 tablet (81 mg total) by mouth daily. 05/20/17  Yes Enid Baas, MD  atorvastatin (LIPITOR) 80 MG tablet Take 1 tablet (80 mg total) by mouth every evening. 05/20/17  Yes Enid Baas, MD  carvedilol (COREG) 6.25 MG tablet Take 1 tablet (6.25 mg total) by mouth 2 (two) times daily. 05/20/17  Yes Enid Baas, MD  citalopram (CELEXA) 20 MG tablet Take 1 tablet (20 mg total) by mouth daily. 05/20/17  Yes Enid Baas, MD  furosemide (LASIX) 20 MG tablet Take 1 tablet (20 mg total) by mouth daily. 12/02/17  Yes Gouru, Deanna Artis, MD  gabapentin (NEURONTIN) 300 MG capsule Take 900 mg by mouth at bedtime.   Yes [provider]  insulin glargine (LANTUS) 100 UNIT/ML injection Inject 33 Units into the skin at bedtime.    Yes [provider]  tamsulosin (FLOMAX) 0.4 MG CAPS capsule Take 1 capsule (0.4 mg total) by mouth daily. 05/20/17  Yes Enid Baas, MD  Review of Systems  Constitutional: Positive for fatigue. Negative for appetite change and fever.  HENT: Positive for congestion. Negative for rhinorrhea and sore throat.   Eyes: Negative.   Respiratory: Positive for cough and shortness of breath (when walking long distances). Negative for chest tightness.   Cardiovascular: Positive for chest pain (at times). Negative for palpitations and leg swelling.  Gastrointestinal: Negative for abdominal distention and abdominal pain.  Endocrine: Negative.    Genitourinary: Negative.   Musculoskeletal: Positive for arthralgias (right knee hurts). Negative for back pain and neck pain.  Skin: Negative.   Allergic/Immunologic: Negative.   Neurological: Positive for dizziness (at times when changing positions quickly). Negative for light-headedness.  Hematological: Negative for adenopathy. Does not bruise/bleed easily.  Psychiatric/Behavioral: Negative for dysphoric mood and sleep disturbance (sleeping on 2 pillows). The patient is not nervous/anxious.    Vitals:   12/03/17 0842  BP: (!) 83/59  Pulse: 79  Resp: 18  SpO2: 96%  Weight: 170 lb 2 oz (77.2 kg)  Height: 5\' 5"  (1.651 m)   Wt Readings from Last 3 Encounters:  12/03/17 170 lb 2 oz (77.2 kg)  11/30/17 172 lb 3.2 oz (78.1 kg)  11/05/17 176 lb 2 oz (79.9 kg)   Lab Results  Component Value Date   CREATININE 2.45 (H) 11/30/2017   CREATININE 3.29 (H) 11/29/2017   CREATININE 2.05 (H) 08/25/2017    Physical Exam  Constitutional: He is oriented to person, place, and time. He appears well-developed and well-nourished.  HENT:  Head: Normocephalic and atraumatic.  Neck: Normal range of motion. Neck supple. No JVD present.  Cardiovascular: Normal rate and regular rhythm.  Pulmonary/Chest: Effort normal and breath sounds normal.  Abdominal: Soft. He exhibits no distension. There is no tenderness.  Musculoskeletal: He exhibits no edema or tenderness.  Neurological: He is alert and oriented to person, place, and time.  Skin: Skin is warm and dry.  Psychiatric: He has a normal mood and affect. His behavior is normal. Thought content normal.  Nursing note and vitals reviewed.  Assessment & Plan:  1: Chronic heart failure with reduced ejection fraction-  - NYHA class II - euvolemic today - Weighing daily and reminded to call for an overnight weight gain of >2 pounds or a weekly weight gain of >5 pounds - weight down 6 pounds  - not adding salt - Reviewed fluid intake; No more than  40-50 ounces/day  - sees [facility]ology at [facility])  - patient reports receiving flu/ pneumonia vaccines for this season already - will not recheck BMP today since recently had it checked in the hospital  2: Hypotension- - BP low today & he hasn't taken any of his medications yet - taking furosemide 20mg  daily; advised to not take his furosemide today and when resuming it tomorrow to take it every other day for the next week until he returns - entresto had been stopped due to low blood pressure - BMP from 11/30/17 reviewed and showed sodium 133, potassium 4.7 and GFR 31  3: Diabetes- - fasting glucose in clinic this morning was 155  - continues on Lantus qhs - saw PCP Fredia Beets) 11/20/17  4: Alcohol use- - last alcohol use was 11/29/17 - complete cessation discussed for 3 minutes with him  5: Influenza- - was unable to afford the tamiflu so advised him that he doesn't need to get it filled at this point - encouraged plenty of rest and adequate diet/hydration  Medication bottles were reviewed.  Return in 1  week or sooner for any questions/problems before then.

## 2017-12-03 ENCOUNTER — Encounter: Payer: Self-pay | Admitting: Family

## 2017-12-03 ENCOUNTER — Ambulatory Visit: Payer: Medicaid Other | Attending: Family | Admitting: Family

## 2017-12-03 VITALS — BP 83/59 | HR 79 | Resp 18 | Ht 65.0 in | Wt 170.1 lb

## 2017-12-03 DIAGNOSIS — I5022 Chronic systolic (congestive) heart failure: Secondary | ICD-10-CM | POA: Insufficient documentation

## 2017-12-03 DIAGNOSIS — I110 Hypertensive heart disease with heart failure: Secondary | ICD-10-CM | POA: Insufficient documentation

## 2017-12-03 DIAGNOSIS — R0602 Shortness of breath: Secondary | ICD-10-CM | POA: Diagnosis present

## 2017-12-03 DIAGNOSIS — J111 Influenza due to unidentified influenza virus with other respiratory manifestations: Secondary | ICD-10-CM | POA: Insufficient documentation

## 2017-12-03 DIAGNOSIS — E1169 Type 2 diabetes mellitus with other specified complication: Secondary | ICD-10-CM | POA: Insufficient documentation

## 2017-12-03 DIAGNOSIS — F329 Major depressive disorder, single episode, unspecified: Secondary | ICD-10-CM | POA: Diagnosis not present

## 2017-12-03 DIAGNOSIS — Z7289 Other problems related to lifestyle: Secondary | ICD-10-CM | POA: Insufficient documentation

## 2017-12-03 DIAGNOSIS — E1140 Type 2 diabetes mellitus with diabetic neuropathy, unspecified: Secondary | ICD-10-CM | POA: Diagnosis not present

## 2017-12-03 DIAGNOSIS — E785 Hyperlipidemia, unspecified: Secondary | ICD-10-CM | POA: Diagnosis not present

## 2017-12-03 DIAGNOSIS — Z7982 Long term (current) use of aspirin: Secondary | ICD-10-CM | POA: Insufficient documentation

## 2017-12-03 DIAGNOSIS — Z794 Long term (current) use of insulin: Secondary | ICD-10-CM | POA: Insufficient documentation

## 2017-12-03 DIAGNOSIS — I959 Hypotension, unspecified: Secondary | ICD-10-CM | POA: Diagnosis not present

## 2017-12-03 DIAGNOSIS — Z79899 Other long term (current) drug therapy: Secondary | ICD-10-CM | POA: Insufficient documentation

## 2017-12-03 DIAGNOSIS — J101 Influenza due to other identified influenza virus with other respiratory manifestations: Secondary | ICD-10-CM

## 2017-12-03 DIAGNOSIS — I950 Idiopathic hypotension: Secondary | ICD-10-CM

## 2017-12-03 DIAGNOSIS — E1022 Type 1 diabetes mellitus with diabetic chronic kidney disease: Secondary | ICD-10-CM

## 2017-12-03 DIAGNOSIS — N183 Chronic kidney disease, stage 3 (moderate): Secondary | ICD-10-CM

## 2017-12-03 DIAGNOSIS — F1010 Alcohol abuse, uncomplicated: Secondary | ICD-10-CM

## 2017-12-03 LAB — GLUCOSE, CAPILLARY: Glucose-Capillary: 155 mg/dL — ABNORMAL HIGH (ref 65–99)

## 2017-12-03 NOTE — Patient Instructions (Addendum)
Continue weighing daily and call for an overnight weight gain of > 2 pounds or a weekly weight gain of >5 pounds.  Decrease fluid pill (furosemide) to every other day. Do not take it today due to your low blood pressure.

## 2017-12-04 DIAGNOSIS — J101 Influenza due to other identified influenza virus with other respiratory manifestations: Secondary | ICD-10-CM | POA: Insufficient documentation

## 2017-12-11 ENCOUNTER — Ambulatory Visit: Payer: Medicaid Other | Admitting: Family

## 2017-12-11 ENCOUNTER — Telehealth: Payer: Self-pay | Admitting: Family

## 2017-12-11 NOTE — Progress Notes (Deleted)
Patient ID: Maurice Jordan, male    DOB: 23-May-1956, 62 y.o.   MRN: [id number]  HPI  Maurice Jordan is a 62 y/o male with a history of depression, DM, hyperlipidemia, HTN, osteomyelitis, current alcohol use and chronic heart failure.   Echo report from 05/19/17 reviewed and shows an EF of 30% along with mild Maurice and normal pulmonary artery pressures.  Admitted 11/29/17 due to chest pain and influenza A. Cardiology consult. Chronically elevated troponin levels. Medications were adjusted and he was discharged the next day. Admitted 08/23/17 due to unresponsiveness possibly due to alcohol use. Discharged home after 2 days. Admitted 05/18/17 due to syncope thought to be due to alcohol intoxication. MRI/EEG done and were negative. Cardiology and neurology consults were obtained. Carotid dopplers negative. Elevated troponin thought to be due to demand ischemia. Discharged home after 2 days.  He presents today for a follow up visit with a chief complaint of   Past Medical History:  Diagnosis Date  . Alcohol abuse   . Congestive heart failure (HCC)   . Depression   . Diabetes (HCC)   . Hx MRSA infection 04/15/2014  . Hyperlipidemia   . Hypertension   . Neuropathy   . Osteomyelitis of toe (HCC)    3rd toe   Past Surgical History:  Procedure Laterality Date  . AMPUTATION OF REPLICATED TOES     No family history on file. Social History   Tobacco Use  . Smoking status: Never Smoker  . Smokeless tobacco: Former Engineer, water Use Topics  . Alcohol use: Yes    Alcohol/week: 4.8 oz    Types: 8 Cans of beer per week   No Known Allergies    Review of Systems  Constitutional: Positive for fatigue. Negative for appetite change and fever.  HENT: Positive for congestion. Negative for rhinorrhea and sore throat.   Eyes: Negative.   Respiratory: Positive for cough and shortness of breath (when walking long distances). Negative for chest tightness.   Cardiovascular: Positive for chest pain (at times).  Negative for palpitations and leg swelling.  Gastrointestinal: Negative for abdominal distention and abdominal pain.  Endocrine: Negative.   Genitourinary: Negative.   Musculoskeletal: Positive for arthralgias (right knee hurts). Negative for back pain and neck pain.  Skin: Negative.   Allergic/Immunologic: Negative.   Neurological: Positive for dizziness (at times when changing positions quickly). Negative for light-headedness.  Hematological: Negative for adenopathy. Does not bruise/bleed easily.  Psychiatric/Behavioral: Negative for dysphoric mood and sleep disturbance (sleeping on 2 pillows). The patient is not nervous/anxious.     Physical Exam  Constitutional: He is oriented to person, place, and time. He appears well-developed and well-nourished.  HENT:  Head: Normocephalic and atraumatic.  Neck: Normal range of motion. Neck supple. No JVD present.  Cardiovascular: Normal rate and regular rhythm.  Pulmonary/Chest: Effort normal and breath sounds normal.  Abdominal: Soft. He exhibits no distension. There is no tenderness.  Musculoskeletal: He exhibits no edema or tenderness.  Neurological: He is alert and oriented to person, place, and time.  Skin: Skin is warm and dry.  Psychiatric: He has a normal mood and affect. His behavior is normal. Thought content normal.  Nursing note and vitals reviewed.  Assessment & Plan:  1: Chronic heart failure with reduced ejection fraction-  - NYHA class II - euvolemic today - Weighing daily and reminded to call for an overnight weight gain of >2 pounds or a weekly weight gain of >5 pounds - weight down 6 pounds  -  not adding salt - Reviewed fluid intake; No more than 40-50 ounces/day  - sees [facility]ology at [facility])  - patient reports receiving flu/ pneumonia vaccines for this season already - will not recheck BMP today since recently had it checked in the hospital  2: Hypotension- - BP low today & he hasn't taken any of his  medications yet - taking furosemide 20mg  daily; advised to not take his furosemide today and when resuming it tomorrow to take it every other day for the next week until he returns - entresto had been stopped due to low blood pressure - BMP from 11/30/17 reviewed and showed sodium 133, potassium 4.7 and GFR 31  3: Diabetes- - fasting glucose in clinic this morning was 155  - continues on Lantus qhs - saw PCP Maurice Jordan) 11/20/17  4: Alcohol use- - last alcohol use was 11/29/17 - complete cessation discussed for 3 minutes with him  5: Influenza- - was unable to afford the tamiflu so advised him that he doesn't need to get it filled at this point - encouraged plenty of rest and adequate diet/hydration  Medication bottles were reviewed.

## 2017-12-11 NOTE — Telephone Encounter (Signed)
Patient showed up almost an hour late without a phone call saying he would be late. Appointment was rescheduled to 12/16/17.

## 2017-12-15 NOTE — Progress Notes (Signed)
Patient ID: Maurice Jordan, male    DOB: [birth year], 62 y.o.   MRN: [id number]  HPI  Maurice Jordan is a 62 y/o male with a history of depression, DM, hyperlipidemia, HTN, osteomyelitis, current alcohol use and chronic heart failure.   Echo report from 05/19/17 reviewed and shows an EF of 30% along with mild Maurice and normal pulmonary artery pressures.  Admitted 11/29/17 due to chest pain and influenza A. Cardiology consult. Chronically elevated troponin levels. Medications were adjusted and he was discharged the next day. Admitted 08/23/17 due to unresponsiveness possibly due to alcohol use. Discharged home after 2 days. Admitted 05/18/17 due to syncope thought to be due to alcohol intoxication. MRI/EEG done and were negative. Cardiology and neurology consults were obtained. Carotid dopplers negative. Elevated troponin thought to be due to demand ischemia. Discharged home after 2 days.  He presents today for a follow up visit with a chief complaint of minimal fatigue upon moderate exertion. He describes this as chronic in nature having been present for several years with varying levels of severity. He does feel like his energy level has been improving recently. He has associated dry cough, shortness of breath, intermittent palpitations and dizziness along with this. He denies any difficulty sleeping, edema, chest pain, abdominal distention or weight gain. Has continued to take furosemide every other day since he was last here.   Past Medical History:  Diagnosis Date  . Alcohol abuse   . Congestive heart failure (HCC)   . Depression   . Diabetes (HCC)   . Hx MRSA infection 04/15/2014  . Hyperlipidemia   . Hypertension   . Neuropathy   . Osteomyelitis of toe (HCC)    3rd toe   Past Surgical History:  Procedure Laterality Date  . AMPUTATION OF REPLICATED TOES     No family history on file. Social History   Tobacco Use  . Smoking status: Never Smoker  . Smokeless tobacco: Former Engineer, water  Use Topics  . Alcohol use: Yes    Alcohol/week: 4.8 oz    Types: 8 Cans of beer per week   No Known Allergies  Prior to Admission medications   Medication Sig Start Date End Date Taking? Authorizing Provider  acetaminophen (TYLENOL) 325 MG tablet Take 2 tablets (650 mg total) by mouth every 6 (six) hours as needed for mild pain (or Fever >/= 101). 11/30/17  Yes Gouru, Deanna Artis, MD  albuterol (PROVENTIL HFA;VENTOLIN HFA) 108 (90 Base) MCG/ACT inhaler Inhale 2 puffs into the lungs every 6 (six) hours as needed for wheezing or shortness of breath. 11/30/17  Yes Gouru, Deanna Artis, MD  aspirin EC 81 MG tablet Take 1 tablet (81 mg total) by mouth daily. 05/20/17  Yes Enid Baas, MD  atorvastatin (LIPITOR) 80 MG tablet Take 1 tablet (80 mg total) by mouth every evening. 05/20/17  Yes Enid Baas, MD  carvedilol (COREG) 6.25 MG tablet Take 1 tablet (6.25 mg total) by mouth 2 (two) times daily. 05/20/17  Yes Enid Baas, MD  citalopram (CELEXA) 20 MG tablet Take 1 tablet (20 mg total) by mouth daily. 05/20/17  Yes Enid Baas, MD  furosemide (LASIX) 20 MG tablet Take 20 mg by mouth every other day.   Yes [provider]  gabapentin (NEURONTIN) 300 MG capsule Take 900 mg by mouth at bedtime.   Yes [provider]  insulin glargine (LANTUS) 100 UNIT/ML injection Inject 33 Units into the skin at bedtime.    Yes [provider]  sacubitril-valsartan Sherryll Burger)  49-51 MG Take 1 tablet by mouth 2 (two) times daily.   Yes [provider]  tamsulosin (FLOMAX) 0.4 MG CAPS capsule Take 1 capsule (0.4 mg total) by mouth daily. 05/20/17  Yes Enid Baas, MD    Review of Systems  Constitutional: Positive for fatigue (improving). Negative for appetite change and fever.  HENT: Positive for rhinorrhea. Negative for congestion and sore throat.   Eyes: Negative.   Respiratory: Positive for cough (dry) and shortness of breath (when walking long distances).  Negative for chest tightness.   Cardiovascular: Positive for palpitations (infrequent). Negative for chest pain and leg swelling.  Gastrointestinal: Negative for abdominal distention and abdominal pain.  Endocrine: Negative.   Genitourinary: Negative.   Musculoskeletal: Positive for arthralgias (right knee hurts). Negative for back pain and neck pain.  Skin: Negative.   Allergic/Immunologic: Negative.   Neurological: Positive for dizziness (at times when changing positions quickly). Negative for light-headedness.  Hematological: Negative for adenopathy. Does not bruise/bleed easily.  Psychiatric/Behavioral: Negative for dysphoric mood and sleep disturbance (sleeping on 2 pillows). The patient is not nervous/anxious.    Vitals:   12/16/17 0843  BP: (!) 86/61  Pulse: 85  Resp: 16  Temp: 98.5 F (36.9 C)  TempSrc: Oral  SpO2: 93%  Weight: 164 lb 3.2 oz (74.5 kg)  Height: 5\' 6"  (1.676 m)   Wt Readings from Last 3 Encounters:  12/16/17 164 lb 3.2 oz (74.5 kg)  12/03/17 170 lb 2 oz (77.2 kg)  11/30/17 172 lb 3.2 oz (78.1 kg)   Lab Results  Component Value Date   CREATININE 2.45 (H) 11/30/2017   CREATININE 3.29 (H) 11/29/2017   CREATININE 2.05 (H) 08/25/2017    Physical Exam  Constitutional: He is oriented to person, place, and time. He appears well-developed and well-nourished.  HENT:  Head: Normocephalic and atraumatic.  Neck: Normal range of motion. Neck supple. No JVD present.  Cardiovascular: Normal rate and regular rhythm.  Pulmonary/Chest: Effort normal and breath sounds normal.  Abdominal: Soft. He exhibits no distension. There is no tenderness.  Musculoskeletal: He exhibits no edema or tenderness.  Neurological: He is alert and oriented to person, place, and time.  Skin: Skin is warm and dry.  Psychiatric: He has a normal mood and affect. His behavior is normal. Thought content normal.  Nursing note and vitals reviewed.  Assessment & Plan:  1: Chronic heart  failure with reduced ejection fraction-  - NYHA class II - euvolemic today - Weighing daily and reminded to call for an overnight weight gain of >2 pounds or a weekly weight gain of >5 pounds - weight down 6 pounds since he was last here 12/03/17 - not adding salt - Reviewed fluid intake; No more than 40-50 ounces/day  - sees [facility]ology at [facility])  - patient reports receiving flu/ pneumonia vaccines for this season already  2: Hypotension- - BP remains low - taking furosemide 20mg  every other day - entresto had been stopped due to low blood pressure but today he has the bottle with him and says that he's been taking it - will stop entresto today & it was put in a separate bag marked "do not take" - BMP from 11/30/17 reviewed and showed sodium 133, potassium 4.7 and GFR 31  3: Diabetes- - nonfasting glucose in clinic this morning was 202 - continues on Lantus qhs - saw PCP Fredia Beets) 11/20/17  4: Alcohol use- - last alcohol use was 12/16/17 and he drank a 12 ounce can of  beer - complete cessation discussed for 3 minutes with him  Medication bottles were reviewed.  Return in 1 week or sooner for any questions/problems before then.

## 2017-12-16 ENCOUNTER — Encounter: Payer: Self-pay | Admitting: Family

## 2017-12-16 ENCOUNTER — Other Ambulatory Visit: Payer: Self-pay

## 2017-12-16 ENCOUNTER — Ambulatory Visit: Payer: Medicaid Other | Attending: Family | Admitting: Family

## 2017-12-16 VITALS — BP 86/61 | HR 85 | Temp 98.5°F | Resp 16 | Ht 66.0 in | Wt 164.2 lb

## 2017-12-16 DIAGNOSIS — Z79899 Other long term (current) drug therapy: Secondary | ICD-10-CM | POA: Diagnosis not present

## 2017-12-16 DIAGNOSIS — I959 Hypotension, unspecified: Secondary | ICD-10-CM | POA: Insufficient documentation

## 2017-12-16 DIAGNOSIS — E1022 Type 1 diabetes mellitus with diabetic chronic kidney disease: Secondary | ICD-10-CM

## 2017-12-16 DIAGNOSIS — Z87891 Personal history of nicotine dependence: Secondary | ICD-10-CM | POA: Diagnosis not present

## 2017-12-16 DIAGNOSIS — E1140 Type 2 diabetes mellitus with diabetic neuropathy, unspecified: Secondary | ICD-10-CM | POA: Insufficient documentation

## 2017-12-16 DIAGNOSIS — E785 Hyperlipidemia, unspecified: Secondary | ICD-10-CM | POA: Insufficient documentation

## 2017-12-16 DIAGNOSIS — Z8614 Personal history of Methicillin resistant Staphylococcus aureus infection: Secondary | ICD-10-CM | POA: Diagnosis not present

## 2017-12-16 DIAGNOSIS — I110 Hypertensive heart disease with heart failure: Secondary | ICD-10-CM | POA: Insufficient documentation

## 2017-12-16 DIAGNOSIS — I950 Idiopathic hypotension: Secondary | ICD-10-CM

## 2017-12-16 DIAGNOSIS — N183 Chronic kidney disease, stage 3 (moderate): Secondary | ICD-10-CM

## 2017-12-16 DIAGNOSIS — I5022 Chronic systolic (congestive) heart failure: Secondary | ICD-10-CM | POA: Diagnosis not present

## 2017-12-16 DIAGNOSIS — Z794 Long term (current) use of insulin: Secondary | ICD-10-CM | POA: Diagnosis not present

## 2017-12-16 DIAGNOSIS — F329 Major depressive disorder, single episode, unspecified: Secondary | ICD-10-CM | POA: Insufficient documentation

## 2017-12-16 DIAGNOSIS — Z7982 Long term (current) use of aspirin: Secondary | ICD-10-CM | POA: Diagnosis not present

## 2017-12-16 DIAGNOSIS — I509 Heart failure, unspecified: Secondary | ICD-10-CM | POA: Diagnosis present

## 2017-12-16 DIAGNOSIS — F1010 Alcohol abuse, uncomplicated: Secondary | ICD-10-CM

## 2017-12-16 LAB — GLUCOSE, CAPILLARY: Glucose-Capillary: 202 mg/dL — ABNORMAL HIGH (ref 65–99)

## 2017-12-16 NOTE — Patient Instructions (Addendum)
Continue weighing daily and call for an overnight weight gain of > 2 pounds or a weekly weight gain of >5 pounds.  Continue taking furosemide (lasix) every other day.  Stop taking entresto

## 2017-12-25 NOTE — Progress Notes (Deleted)
Patient ID: Maurice Jordan, male    DOB: 1956-05-18, 62 y.o.   MRN: [id number]  HPI  Maurice Jordan is a 62 y/o male with a history of depression, DM, hyperlipidemia, HTN, osteomyelitis, current alcohol use and chronic heart failure.   Echo report from 05/19/17 reviewed and shows an EF of 30% along with mild Maurice and normal pulmonary artery pressures.  Admitted 11/29/17 due to chest pain and influenza A. Cardiology consult. Chronically elevated troponin levels. Medications were adjusted and he was discharged the next day. Admitted 08/23/17 due to unresponsiveness possibly due to alcohol use. Discharged home after 2 days. Admitted 05/18/17 due to syncope thought to be due to alcohol intoxication. MRI/EEG done and were negative. Cardiology and neurology consults were obtained. Carotid dopplers negative. Elevated troponin thought to be due to demand ischemia. Discharged home after 2 days.  He presents today for a follow up visit with a chief complaint of   Past Medical History:  Diagnosis Date  . Alcohol abuse   . Congestive heart failure (HCC)   . Depression   . Diabetes (HCC)   . Hx MRSA infection 04/15/2014  . Hyperlipidemia   . Hypertension   . Neuropathy   . Osteomyelitis of toe (HCC)    3rd toe   Past Surgical History:  Procedure Laterality Date  . AMPUTATION OF REPLICATED TOES     No family history on file. Social History   Tobacco Use  . Smoking status: Never Smoker  . Smokeless tobacco: Former Engineer, water Use Topics  . Alcohol use: Yes    Alcohol/week: 4.8 oz    Types: 8 Cans of beer per week   No Known Allergies    Review of Systems  Constitutional: Positive for fatigue (improving). Negative for appetite change and fever.  HENT: Positive for rhinorrhea. Negative for congestion and sore throat.   Eyes: Negative.   Respiratory: Positive for cough (dry) and shortness of breath (when walking long distances). Negative for chest tightness.   Cardiovascular: Positive for  palpitations (infrequent). Negative for chest pain and leg swelling.  Gastrointestinal: Negative for abdominal distention and abdominal pain.  Endocrine: Negative.   Genitourinary: Negative.   Musculoskeletal: Positive for arthralgias (right knee hurts). Negative for back pain and neck pain.  Skin: Negative.   Allergic/Immunologic: Negative.   Neurological: Positive for dizziness (at times when changing positions quickly). Negative for light-headedness.  Hematological: Negative for adenopathy. Does not bruise/bleed easily.  Psychiatric/Behavioral: Negative for dysphoric mood and sleep disturbance (sleeping on 2 pillows). The patient is not nervous/anxious.      Physical Exam  Constitutional: He is oriented to person, place, and time. He appears well-developed and well-nourished.  HENT:  Head: Normocephalic and atraumatic.  Neck: Normal range of motion. Neck supple. No JVD present.  Cardiovascular: Normal rate and regular rhythm.  Pulmonary/Chest: Effort normal and breath sounds normal.  Abdominal: Soft. He exhibits no distension. There is no tenderness.  Musculoskeletal: He exhibits no edema or tenderness.  Neurological: He is alert and oriented to person, place, and time.  Skin: Skin is warm and dry.  Psychiatric: He has a normal mood and affect. His behavior is normal. Thought content normal.  Nursing note and vitals reviewed.  Assessment & Plan:  1: Chronic heart failure with reduced ejection fraction-  - NYHA class II - euvolemic today - Weighing daily and reminded to call for an overnight weight gain of >2 pounds or a weekly weight gain of >5 pounds - weight down 6  pounds since he was last here 12/03/17 - not adding salt - Reviewed fluid intake; No more than 40-50 ounces/day  - sees [facility]ology at [facility])  - patient reports receiving flu/ pneumonia vaccines for this season already  2: Hypotension- - BP remains low - taking furosemide 20mg  every other day -  entresto had been stopped due to low blood pressure but today he has the bottle with him and says that he's been taking it - will stop entresto today & it was put in a separate bag marked "do not take" - BMP from 11/30/17 reviewed and showed sodium 133, potassium 4.7 and GFR 31  3: Diabetes- - nonfasting glucose in clinic this morning was  - continues on Lantus qhs - saw PCP Maurice Jordan) 11/20/17  4: Alcohol use- - last alcohol use was 12/16/17 and he drank a 12 ounce can of beer - complete cessation discussed for 3 minutes with him  Medication bottles were reviewed.

## 2017-12-26 ENCOUNTER — Ambulatory Visit: Payer: Medicaid Other | Admitting: Family

## 2017-12-30 NOTE — Progress Notes (Signed)
Patient ID: Maurice Jordan, male    DOB: September 03, 1956, 62 y.o.   MRN: [id number]  HPI  Maurice Jordan is a 62 y/o male with a history of depression, DM, hyperlipidemia, HTN, osteomyelitis, current alcohol use and chronic heart failure.   Echo report from 05/19/17 reviewed and shows an EF of 30% along with mild Maurice and normal pulmonary artery pressures.  Admitted 11/29/17 due to chest pain and influenza A. Cardiology consult. Chronically elevated troponin levels. Medications were adjusted and he was discharged the next day. Admitted 08/23/17 due to unresponsiveness possibly due to alcohol use. Discharged home after 2 days. Admitted 05/18/17 due to syncope thought to be due to alcohol intoxication. MRI/EEG done and were negative. Cardiology and neurology consults were obtained. Carotid dopplers negative. Elevated troponin thought to be due to demand ischemia. Discharged home after 2 days.  He presents today for a follow up visit with a chief complaint of minimal shortness of breath upon moderate exertion. He says this has been present for several years and has continued to improve. He has associated fatigue, dizziness and gradual weight gain. He denies any difficulty sleeping, abdominal distention, palpitations, edema, chest pain or cough. Has had losartan ordered but he hasn't been able to pick it up yet.   Past Medical History:  Diagnosis Date  . Alcohol abuse   . Congestive heart failure (HCC)   . Depression   . Diabetes (HCC)   . Hx MRSA infection 04/15/2014  . Hyperlipidemia   . Hypertension   . Neuropathy   . Osteomyelitis of toe (HCC)    3rd toe   Past Surgical History:  Procedure Laterality Date  . AMPUTATION OF REPLICATED TOES     No family history on file. Social History   Tobacco Use  . Smoking status: Never Smoker  . Smokeless tobacco: Former Engineer, water Use Topics  . Alcohol use: Yes    Alcohol/week: 4.8 oz    Types: 8 Cans of beer per week   No Known Allergies  Prior to  Admission medications   Medication Sig Start Date End Date Taking? Authorizing Provider  aspirin EC 81 MG tablet Take 1 tablet (81 mg total) by mouth daily. 05/20/17  Yes Enid Baas, MD  atorvastatin (LIPITOR) 80 MG tablet Take 1 tablet (80 mg total) by mouth every evening. 05/20/17  Yes Enid Baas, MD  carvedilol (COREG) 6.25 MG tablet Take 1 tablet (6.25 mg total) by mouth 2 (two) times daily. 05/20/17  Yes Enid Baas, MD  citalopram (CELEXA) 20 MG tablet Take 1 tablet (20 mg total) by mouth daily. 05/20/17  Yes Enid Baas, MD  furosemide (LASIX) 20 MG tablet Take 20 mg by mouth every other day.   Yes [provider]  gabapentin (NEURONTIN) 300 MG capsule Take 900 mg by mouth at bedtime.   Yes [provider]  insulin glargine (LANTUS) 100 UNIT/ML injection Inject 33 Units into the skin at bedtime.    Yes [provider]  tamsulosin (FLOMAX) 0.4 MG CAPS capsule Take 1 capsule (0.4 mg total) by mouth daily. 05/20/17  Yes Enid Baas, MD  acetaminophen (TYLENOL) 325 MG tablet Take 2 tablets (650 mg total) by mouth every 6 (six) hours as needed for mild pain (or Fever >/= 101). Patient not taking: Reported on 12/31/2017 11/30/17   Ramonita Lab, MD  albuterol (PROVENTIL HFA;VENTOLIN HFA) 108 (90 Base) MCG/ACT inhaler Inhale 2 puffs into the lungs every 6 (six) hours as needed for wheezing or shortness of  breath. Patient not taking: Reported on 12/31/2017 11/30/17   Ramonita Lab, MD   Review of Systems  Constitutional: Positive for fatigue (improving). Negative for appetite change and fever.  HENT: Negative for congestion, rhinorrhea and sore throat.   Eyes: Negative.   Respiratory: Positive for shortness of breath (when walking long distances). Negative for cough and chest tightness.   Cardiovascular: Negative for chest pain, palpitations and leg swelling.  Gastrointestinal: Negative for abdominal distention and abdominal pain.  Endocrine:  Negative.   Genitourinary: Negative.   Musculoskeletal: Positive for arthralgias (right knee hurts). Negative for back pain and neck pain.  Skin: Negative.   Allergic/Immunologic: Negative.   Neurological: Positive for dizziness (at times when changing positions quickly). Negative for light-headedness.  Hematological: Negative for adenopathy. Does not bruise/bleed easily.  Psychiatric/Behavioral: Negative for dysphoric mood and sleep disturbance (sleeping on 2 pillows). The patient is not nervous/anxious.    Vitals:   12/31/17 1002  Resp: 18  Weight: 176 lb (79.8 kg)  Height: 5\' 7"  (1.702 m)   Wt Readings from Last 3 Encounters:  12/31/17 176 lb (79.8 kg)  12/16/17 164 lb 3.2 oz (74.5 kg)  12/03/17 170 lb 2 oz (77.2 kg)   Lab Results  Component Value Date   CREATININE 2.45 (H) 11/30/2017   CREATININE 3.29 (H) 11/29/2017   CREATININE 2.05 (H) 08/25/2017    Physical Exam  Constitutional: He is oriented to person, place, and time. He appears well-developed and well-nourished.  HENT:  Head: Normocephalic and atraumatic.  Neck: Normal range of motion. Neck supple. No JVD present.  Cardiovascular: Normal rate and regular rhythm.  Pulmonary/Chest: Effort normal and breath sounds normal.  Abdominal: Soft. He exhibits no distension. There is no tenderness.  Musculoskeletal: He exhibits no edema or tenderness.  Neurological: He is alert and oriented to person, place, and time.  Skin: Skin is warm and dry.  Psychiatric: He has a normal mood and affect. His behavior is normal. Thought content normal.  Nursing note and vitals reviewed.  Assessment & Plan:  1: Chronic heart failure with reduced ejection fraction-  - NYHA class II - euvolemic today - Weighing daily and reminded to call for an overnight weight gain of >2 pounds or a weekly weight gain of >5 pounds - in reviewing home weight chart, there were 2 days where he had gained 3 pounds overnight. Educated on the importance of  calling the office for above weight gain parameters - weight up 12 pounds since he was last here 2 weeks ago; eating ice cream every night before bed - not adding salt - Reviewed fluid intake; No more than 40-50 ounces/day  - sees [facility]ology at [facility])  - patient reports receiving flu/ pneumonia vaccines for this season already - PharmD reconciled medications with the patient  2: HTN- - BP high today & losartan 25mg  was ordered by PCP on 12/25/17 but patient hasn't started medication yet - he says that he'll try to get the medication picked up but he may have to wait until he gets paid on 01/21/18 - taking furosemide 20mg  every other day - entresto had been stopped due to previously low blood pressure - BMP from 11/30/17 reviewed and showed sodium 133, potassium 4.7 and GFR 31  3: Diabetes- - nonfasting glucose at home this morning was 281 - eating ice cream nightly; encouraged to limit ice cream as well as switch to sherbert - continues on Lantus qhs - saw PCP Rubye Oaks) 12/25/17  4: Alcohol use- -  last alcohol use was 12/16/17 and he drank a 12 ounce can of beer - complete cessation discussed for 3 minutes with him  Medication bottles were reviewed.  Return in 2 weeks or sooner for any questions/problems before then. Will plan on checked a BMP at that time as he'll have been on losartan for a couple of weeks.  If he's unable to get the losartan prior to 01/21/18, he'll call back so that we can push his appointment back so that he can be seen after being on losartan for a couple of weeks.

## 2017-12-31 ENCOUNTER — Encounter: Payer: Self-pay | Admitting: Family

## 2017-12-31 ENCOUNTER — Ambulatory Visit: Payer: Medicaid Other | Attending: Family | Admitting: Family

## 2017-12-31 VITALS — BP 161/98 | HR 84 | Resp 18 | Ht 67.0 in | Wt 176.0 lb

## 2017-12-31 DIAGNOSIS — Z8614 Personal history of Methicillin resistant Staphylococcus aureus infection: Secondary | ICD-10-CM | POA: Insufficient documentation

## 2017-12-31 DIAGNOSIS — F1010 Alcohol abuse, uncomplicated: Secondary | ICD-10-CM | POA: Diagnosis not present

## 2017-12-31 DIAGNOSIS — I5022 Chronic systolic (congestive) heart failure: Secondary | ICD-10-CM | POA: Diagnosis present

## 2017-12-31 DIAGNOSIS — I110 Hypertensive heart disease with heart failure: Secondary | ICD-10-CM | POA: Diagnosis not present

## 2017-12-31 DIAGNOSIS — Z89429 Acquired absence of other toe(s), unspecified side: Secondary | ICD-10-CM | POA: Diagnosis not present

## 2017-12-31 DIAGNOSIS — Z79899 Other long term (current) drug therapy: Secondary | ICD-10-CM | POA: Diagnosis not present

## 2017-12-31 DIAGNOSIS — Z794 Long term (current) use of insulin: Secondary | ICD-10-CM | POA: Diagnosis not present

## 2017-12-31 DIAGNOSIS — E1022 Type 1 diabetes mellitus with diabetic chronic kidney disease: Secondary | ICD-10-CM

## 2017-12-31 DIAGNOSIS — F329 Major depressive disorder, single episode, unspecified: Secondary | ICD-10-CM | POA: Insufficient documentation

## 2017-12-31 DIAGNOSIS — Z87891 Personal history of nicotine dependence: Secondary | ICD-10-CM | POA: Insufficient documentation

## 2017-12-31 DIAGNOSIS — E1169 Type 2 diabetes mellitus with other specified complication: Secondary | ICD-10-CM | POA: Insufficient documentation

## 2017-12-31 DIAGNOSIS — E785 Hyperlipidemia, unspecified: Secondary | ICD-10-CM | POA: Insufficient documentation

## 2017-12-31 DIAGNOSIS — Z7982 Long term (current) use of aspirin: Secondary | ICD-10-CM | POA: Diagnosis not present

## 2017-12-31 DIAGNOSIS — E1142 Type 2 diabetes mellitus with diabetic polyneuropathy: Secondary | ICD-10-CM | POA: Diagnosis not present

## 2017-12-31 DIAGNOSIS — N183 Chronic kidney disease, stage 3 (moderate): Secondary | ICD-10-CM

## 2017-12-31 DIAGNOSIS — I10 Essential (primary) hypertension: Secondary | ICD-10-CM

## 2017-12-31 NOTE — Patient Instructions (Addendum)
Continue weighing daily and call for an overnight weight gain of > 2 pounds or a weekly weight gain of >5 pounds.  Get losartan picked up as soon as possible

## 2018-01-12 NOTE — Progress Notes (Signed)
Patient ID: Maurice Jordan, male    DOB: 1956-07-02, 62 y.o.   MRN: [id number]  HPI  Maurice Jordan is a 62 y/o male with a history of depression, DM, hyperlipidemia, HTN, osteomyelitis, current alcohol use and chronic heart failure.   Echo report from 05/19/17 reviewed and shows an EF of 30% along with mild Maurice and normal pulmonary artery pressures.  Admitted 11/29/17 due to chest pain and influenza A. Cardiology consult. Chronically elevated troponin levels. Medications were adjusted and he was discharged the next day. Admitted 08/23/17 due to unresponsiveness possibly due to alcohol use. Discharged home after 2 days.   He presents today for a follow up visit with a chief complaint of minimal shortness of breath upon moderate exertion. He says this has been chronic in nature having been present for several years. He has associated fatigue and intermittent dizziness along with this. He denies any difficulty sleeping, abdominal distention, palpitations, edema, chest pain, cough or weight gain. Did have a fall on 01/03/18 after he had too much to drink and fell onto the corner of an end table and now has some rib pain.    Past Medical History:  Diagnosis Date  . Alcohol abuse   . Congestive heart failure (HCC)   . Depression   . Diabetes (HCC)   . Hx MRSA infection 04/15/2014  . Hyperlipidemia   . Hypertension   . Neuropathy   . Osteomyelitis of toe (HCC)    3rd toe   Past Surgical History:  Procedure Laterality Date  . AMPUTATION OF REPLICATED TOES     No family history on file. Social History   Tobacco Use  . Smoking status: Never Smoker  . Smokeless tobacco: Former Engineer, water Use Topics  . Alcohol use: Yes    Alcohol/week: 4.8 oz    Types: 8 Cans of beer per week   No Known Allergies  Prior to Admission medications   Medication Sig Start Date End Date Taking? Authorizing Provider  aspirin 81 MG chewable tablet Chew 81 mg by mouth daily.   Yes [provider]   atorvastatin (LIPITOR) 80 MG tablet Take 1 tablet (80 mg total) by mouth every evening. 05/20/17  Yes Enid Baas, MD  carvedilol (COREG) 6.25 MG tablet Take 1 tablet (6.25 mg total) by mouth 2 (two) times daily. 05/20/17  Yes Enid Baas, MD  citalopram (CELEXA) 20 MG tablet Take 1 tablet (20 mg total) by mouth daily. 05/20/17  Yes Enid Baas, MD  furosemide (LASIX) 20 MG tablet Take 20 mg by mouth 2 (two) times daily.    Yes [provider]  gabapentin (NEURONTIN) 300 MG capsule Take 900 mg by mouth at bedtime.   Yes [provider]  insulin glargine (LANTUS) 100 UNIT/ML injection Inject 33 Units into the skin at bedtime.    Yes [provider]  losartan (COZAAR) 25 MG tablet Take 25 mg by mouth daily.   Yes [provider]  tamsulosin (FLOMAX) 0.4 MG CAPS capsule Take 1 capsule (0.4 mg total) by mouth daily. 05/20/17  Yes Enid Baas, MD  albuterol (PROVENTIL HFA;VENTOLIN HFA) 108 (90 Base) MCG/ACT inhaler Inhale 2 puffs into the lungs every 6 (six) hours as needed for wheezing or shortness of breath. Patient not taking: Reported on 12/31/2017 11/30/17   Ramonita Lab, MD  aspirin EC 81 MG tablet Take 1 tablet (81 mg total) by mouth daily. Patient not taking: Reported on 01/14/2018 05/20/17   Enid Baas, MD  Review of Systems  Constitutional: Positive for fatigue (improving). Negative for appetite change and fever.  HENT: Negative for congestion, rhinorrhea and sore throat.   Eyes: Negative.   Respiratory: Positive for shortness of breath (when walking long distances). Negative for cough and chest tightness.   Cardiovascular: Negative for chest pain, palpitations and leg swelling.  Gastrointestinal: Positive for constipation. Negative for abdominal distention and abdominal pain.  Endocrine: Negative.   Genitourinary: Negative.   Musculoskeletal: Positive for arthralgias (right lower rib cage). Negative for back pain and  neck pain.  Skin: Negative.   Allergic/Immunologic: Negative.   Neurological: Positive for dizziness (at times when changing positions quickly). Negative for light-headedness.  Hematological: Negative for adenopathy. Does not bruise/bleed easily.  Psychiatric/Behavioral: Negative for dysphoric mood and sleep disturbance (sleeping on 2 pillows). The patient is not nervous/anxious.    Vitals:   01/14/18 1000  BP: 120/73  Pulse: 79  Resp: 18  SpO2: 100%  Weight: 173 lb 6 oz (78.6 kg)  Height: 5\' 7"  (1.702 m)   Wt Readings from Last 3 Encounters:  01/14/18 173 lb 6 oz (78.6 kg)  12/31/17 176 lb (79.8 kg)  12/16/17 164 lb 3.2 oz (74.5 kg)   Lab Results  Component Value Date   CREATININE 2.45 (H) 11/30/2017   CREATININE 3.29 (H) 11/29/2017   CREATININE 2.05 (H) 08/25/2017    Physical Exam  Constitutional: He is oriented to person, place, and time. He appears well-developed and well-nourished.  HENT:  Head: Normocephalic and atraumatic.  Neck: Normal range of motion. Neck supple. No JVD present.  Cardiovascular: Normal rate and regular rhythm.  Pulmonary/Chest: Effort normal and breath sounds normal.  Abdominal: Soft. He exhibits no distension. There is no tenderness.  Musculoskeletal: He exhibits no edema or tenderness.  Neurological: He is alert and oriented to person, place, and time.  Skin: Skin is warm and dry. Bruising (right lower rib cage) noted.  Psychiatric: He has a normal mood and affect. His behavior is normal. Thought content normal.  Nursing note and vitals reviewed.  Assessment & Plan:  1: Chronic heart failure with reduced ejection fraction-  - NYHA class II - euvolemic today - Weighing daily and reminded to call for an overnight weight gain of >2 pounds or a weekly weight gain of >5 pounds - weight down 3 pounds from 2 weeks ago; hasn't been eating ice cream  - not adding salt - Reviewed fluid intake; No more than 40-50 ounces/day  - sees cardiology at  [facility])  - patient reports receiving flu/ pneumonia vaccines for this season already - PharmD reconciled medications with the patient  2: HTN- - BP better today since starting losartan - taking furosemide 40mg  daily - entresto had been stopped due to previously low blood pressure - BMP from 11/30/17 reviewed and showed sodium 133, potassium 4.7 and GFR 31  3: Diabetes- - nonfasting glucose at home this morning was 187 - he hasn't been eating ice cream - continues on Lantus qhs - saw PCP Rubye Oaks) 12/25/17  4: Alcohol use- - last alcohol use was 01/03/18 and he drank 7 twelve ounce cans of beer at a party that he went to - said that prior to going to the party, he also didn't take any of his medications so went the entire day without any of his medications - complete cessation discussed for 3 minutes with him  5: S/P fall-  - after he came home from the party, he says that he fell  onto the corner of a coffee table - woke up the next morning and was quite sore along the lower part of the right side of his rib cage - says that the pain is improving and he denies any difficulty breathing - nontender to touch  Medication bottles were reviewed.  Return in 2 months or sooner for any questions/problems before then.

## 2018-01-14 ENCOUNTER — Encounter: Payer: Self-pay | Admitting: Family

## 2018-01-14 ENCOUNTER — Ambulatory Visit: Payer: Medicaid Other | Attending: Family | Admitting: Family

## 2018-01-14 VITALS — BP 120/73 | HR 79 | Resp 18 | Ht 67.0 in | Wt 173.4 lb

## 2018-01-14 DIAGNOSIS — E1022 Type 1 diabetes mellitus with diabetic chronic kidney disease: Secondary | ICD-10-CM

## 2018-01-14 DIAGNOSIS — N183 Chronic kidney disease, stage 3 (moderate): Secondary | ICD-10-CM

## 2018-01-14 DIAGNOSIS — E119 Type 2 diabetes mellitus without complications: Secondary | ICD-10-CM | POA: Diagnosis not present

## 2018-01-14 DIAGNOSIS — E785 Hyperlipidemia, unspecified: Secondary | ICD-10-CM | POA: Diagnosis not present

## 2018-01-14 DIAGNOSIS — I5022 Chronic systolic (congestive) heart failure: Secondary | ICD-10-CM | POA: Diagnosis not present

## 2018-01-14 DIAGNOSIS — Z7982 Long term (current) use of aspirin: Secondary | ICD-10-CM | POA: Diagnosis not present

## 2018-01-14 DIAGNOSIS — W19XXXA Unspecified fall, initial encounter: Secondary | ICD-10-CM | POA: Insufficient documentation

## 2018-01-14 DIAGNOSIS — W19XXXS Unspecified fall, sequela: Secondary | ICD-10-CM

## 2018-01-14 DIAGNOSIS — Z7289 Other problems related to lifestyle: Secondary | ICD-10-CM | POA: Diagnosis not present

## 2018-01-14 DIAGNOSIS — Z79899 Other long term (current) drug therapy: Secondary | ICD-10-CM | POA: Diagnosis not present

## 2018-01-14 DIAGNOSIS — F329 Major depressive disorder, single episode, unspecified: Secondary | ICD-10-CM | POA: Diagnosis not present

## 2018-01-14 DIAGNOSIS — Z794 Long term (current) use of insulin: Secondary | ICD-10-CM | POA: Diagnosis not present

## 2018-01-14 DIAGNOSIS — G629 Polyneuropathy, unspecified: Secondary | ICD-10-CM | POA: Diagnosis not present

## 2018-01-14 DIAGNOSIS — I10 Essential (primary) hypertension: Secondary | ICD-10-CM

## 2018-01-14 DIAGNOSIS — I110 Hypertensive heart disease with heart failure: Secondary | ICD-10-CM | POA: Diagnosis present

## 2018-01-14 DIAGNOSIS — F1010 Alcohol abuse, uncomplicated: Secondary | ICD-10-CM

## 2018-01-14 NOTE — Patient Instructions (Signed)
Continue weighing daily and call for an overnight weight gain of > 2 pounds or a weekly weight gain of >5 pounds. 

## 2018-02-08 ENCOUNTER — Other Ambulatory Visit: Payer: Self-pay

## 2018-02-08 ENCOUNTER — Emergency Department: Payer: Medicaid Other

## 2018-02-08 ENCOUNTER — Inpatient Hospital Stay
Admission: EM | Admit: 2018-02-08 | Discharge: 2018-02-10 | DRG: 637 | Disposition: A | Discharge status: Home / Self Care | Payer: Medicaid Other | Attending: Specialist | Admitting: Specialist

## 2018-02-08 DIAGNOSIS — R4182 Altered mental status, unspecified: Secondary | ICD-10-CM

## 2018-02-08 DIAGNOSIS — Z79899 Other long term (current) drug therapy: Secondary | ICD-10-CM | POA: Diagnosis not present

## 2018-02-08 DIAGNOSIS — Z833 Family history of diabetes mellitus: Secondary | ICD-10-CM | POA: Diagnosis not present

## 2018-02-08 DIAGNOSIS — G9341 Metabolic encephalopathy: Secondary | ICD-10-CM | POA: Diagnosis present

## 2018-02-08 DIAGNOSIS — R680 Hypothermia, not associated with low environmental temperature: Secondary | ICD-10-CM | POA: Diagnosis present

## 2018-02-08 DIAGNOSIS — A419 Sepsis, unspecified organism: Secondary | ICD-10-CM

## 2018-02-08 DIAGNOSIS — E860 Dehydration: Secondary | ICD-10-CM | POA: Diagnosis present

## 2018-02-08 DIAGNOSIS — E785 Hyperlipidemia, unspecified: Secondary | ICD-10-CM | POA: Diagnosis present

## 2018-02-08 DIAGNOSIS — F329 Major depressive disorder, single episode, unspecified: Secondary | ICD-10-CM | POA: Diagnosis present

## 2018-02-08 DIAGNOSIS — E1140 Type 2 diabetes mellitus with diabetic neuropathy, unspecified: Secondary | ICD-10-CM | POA: Diagnosis present

## 2018-02-08 DIAGNOSIS — N183 Chronic kidney disease, stage 3 unspecified: Secondary | ICD-10-CM

## 2018-02-08 DIAGNOSIS — F1010 Alcohol abuse, uncomplicated: Secondary | ICD-10-CM | POA: Diagnosis present

## 2018-02-08 DIAGNOSIS — E11649 Type 2 diabetes mellitus with hypoglycemia without coma: Secondary | ICD-10-CM | POA: Diagnosis present

## 2018-02-08 DIAGNOSIS — F1910 Other psychoactive substance abuse, uncomplicated: Secondary | ICD-10-CM | POA: Diagnosis present

## 2018-02-08 DIAGNOSIS — Z794 Long term (current) use of insulin: Secondary | ICD-10-CM

## 2018-02-08 DIAGNOSIS — E871 Hypo-osmolality and hyponatremia: Secondary | ICD-10-CM | POA: Diagnosis present

## 2018-02-08 DIAGNOSIS — N179 Acute kidney failure, unspecified: Secondary | ICD-10-CM | POA: Diagnosis present

## 2018-02-08 DIAGNOSIS — Z8614 Personal history of Methicillin resistant Staphylococcus aureus infection: Secondary | ICD-10-CM

## 2018-02-08 DIAGNOSIS — I110 Hypertensive heart disease with heart failure: Secondary | ICD-10-CM | POA: Diagnosis present

## 2018-02-08 DIAGNOSIS — I252 Old myocardial infarction: Secondary | ICD-10-CM

## 2018-02-08 DIAGNOSIS — N400 Enlarged prostate without lower urinary tract symptoms: Secondary | ICD-10-CM | POA: Diagnosis present

## 2018-02-08 DIAGNOSIS — E162 Hypoglycemia, unspecified: Secondary | ICD-10-CM

## 2018-02-08 DIAGNOSIS — I5022 Chronic systolic (congestive) heart failure: Secondary | ICD-10-CM | POA: Diagnosis present

## 2018-02-08 DIAGNOSIS — E1022 Type 1 diabetes mellitus with diabetic chronic kidney disease: Secondary | ICD-10-CM

## 2018-02-08 DIAGNOSIS — T68XXXA Hypothermia, initial encounter: Secondary | ICD-10-CM

## 2018-02-08 DIAGNOSIS — Z7982 Long term (current) use of aspirin: Secondary | ICD-10-CM | POA: Diagnosis not present

## 2018-02-08 DIAGNOSIS — Z8249 Family history of ischemic heart disease and other diseases of the circulatory system: Secondary | ICD-10-CM

## 2018-02-08 LAB — CBC WITH DIFFERENTIAL/PLATELET
Basophils Absolute: 0 10*3/uL (ref 0–0.1)
Basophils Relative: 0 %
Eosinophils Absolute: 0 10*3/uL (ref 0–0.7)
Eosinophils Relative: 0 %
HEMATOCRIT: 48.4 % (ref 40.0–52.0)
HEMOGLOBIN: 16.4 g/dL (ref 13.0–18.0)
LYMPHS PCT: 25 %
Lymphs Abs: 0.8 10*3/uL — ABNORMAL LOW (ref 1.0–3.6)
MCH: 31 pg (ref 26.0–34.0)
MCHC: 33.9 g/dL (ref 32.0–36.0)
MCV: 91.3 fL (ref 80.0–100.0)
MONO ABS: 0.2 10*3/uL (ref 0.2–1.0)
Monocytes Relative: 5 %
NEUTROS ABS: 2.2 10*3/uL (ref 1.4–6.5)
Neutrophils Relative %: 70 %
Platelets: 199 10*3/uL (ref 150–440)
RBC: 5.3 MIL/uL (ref 4.40–5.90)
RDW: 13.7 % (ref 11.5–14.5)
WBC: 3.2 10*3/uL — AB (ref 3.8–10.6)

## 2018-02-08 LAB — TROPONIN I: Troponin I: <0.03 ng/mL (ref <0.03)

## 2018-02-08 LAB — COMPREHENSIVE METABOLIC PANEL
ALBUMIN: 5 g/dL (ref 3.5–5.0)
ALT: 49 U/L (ref 17–63)
AST: 53 U/L — AB (ref 15–41)
Alkaline Phosphatase: 96 U/L (ref 38–126)
Anion gap: 12 (ref 5–15)
BUN: 27 mg/dL — AB (ref 6–20)
CO2: 19 mmol/L — ABNORMAL LOW (ref 22–32)
Calcium: 9.1 mg/dL (ref 8.9–10.3)
Chloride: 97 mmol/L — ABNORMAL LOW (ref 101–111)
Creatinine, Ser: 1.38 mg/dL — ABNORMAL HIGH (ref 0.61–1.24)
GFR calc Af Amer: >60 mL/min (ref >60)
GFR calc non Af Amer: 53 mL/min — ABNORMAL LOW (ref >60)
GLUCOSE: 379 mg/dL — AB (ref 65–99)
Potassium: 5.1 mmol/L (ref 3.5–5.1)
Sodium: 128 mmol/L — ABNORMAL LOW (ref 135–145)
TOTAL PROTEIN: 8.9 g/dL — AB (ref 6.5–8.1)
Total Bilirubin: 1 mg/dL (ref 0.3–1.2)

## 2018-02-08 LAB — LACTIC ACID, PLASMA
LACTIC ACID, VENOUS: 2 mmol/L — AB (ref 0.5–1.9)
Lactic Acid, Venous: 2.4 mmol/L (ref 0.5–1.9)

## 2018-02-08 LAB — GLUCOSE, CAPILLARY
Glucose-Capillary: 148 mg/dL — ABNORMAL HIGH (ref 65–99)
Glucose-Capillary: 109 mg/dL — ABNORMAL HIGH (ref 65–99)
Glucose-Capillary: 211 mg/dL — ABNORMAL HIGH (ref 65–99)

## 2018-02-08 LAB — MAGNESIUM: Magnesium: 1.9 mg/dL (ref 1.7–2.4)

## 2018-02-08 LAB — HEMOGLOBIN A1C
Hgb A1c MFr Bld: 8.5 % — ABNORMAL HIGH (ref 4.8–5.6)
Mean Plasma Glucose: 197.25 mg/dL

## 2018-02-08 LAB — MRSA PCR SCREENING: MRSA by PCR: NEGATIVE

## 2018-02-08 LAB — ETHANOL: Alcohol, Ethyl (B): 88 mg/dL — ABNORMAL HIGH (ref <10)

## 2018-02-08 MED ORDER — PIPERACILLIN-TAZOBACTAM 3.375 G IVPB 30 MIN
3.3750 g | Freq: Once | INTRAVENOUS | Status: AC
  Administered 2018-02-08: 3.375 g via INTRAVENOUS
  Filled 2018-02-08: qty 50

## 2018-02-08 MED ORDER — CITALOPRAM HYDROBROMIDE 20 MG PO TABS
20.0000 mg | ORAL_TABLET | Freq: Every day | ORAL | Status: DC
  Administered 2018-02-08 – 2018-02-10 (×3): 20 mg via ORAL
  Filled 2018-02-08 (×3): qty 1

## 2018-02-08 MED ORDER — ATORVASTATIN CALCIUM 20 MG PO TABS
80.0000 mg | ORAL_TABLET | Freq: Every evening | ORAL | Status: DC
  Administered 2018-02-08 – 2018-02-09 (×2): 80 mg via ORAL
  Filled 2018-02-08: qty 4
  Filled 2018-02-08: qty 8

## 2018-02-08 MED ORDER — SENNOSIDES-DOCUSATE SODIUM 8.6-50 MG PO TABS: 1.0000 | ORAL_TABLET | Freq: Every evening | ORAL | Status: DC | PRN

## 2018-02-08 MED ORDER — CARVEDILOL 6.25 MG PO TABS
6.2500 mg | ORAL_TABLET | Freq: Two times a day (BID) | ORAL | Status: DC
  Administered 2018-02-08 – 2018-02-10 (×4): 6.25 mg via ORAL
  Filled 2018-02-08 (×4): qty 1

## 2018-02-08 MED ORDER — HYDROCODONE-ACETAMINOPHEN 5-325 MG PO TABS: 1.0000 | ORAL_TABLET | ORAL | Status: DC | PRN

## 2018-02-08 MED ORDER — SODIUM CHLORIDE 0.9 % IV SOLN
INTRAVENOUS | Status: DC
  Administered 2018-02-08 – 2018-02-09 (×2): via INTRAVENOUS

## 2018-02-08 MED ORDER — ENOXAPARIN SODIUM 40 MG/0.4ML ~~LOC~~ SOLN
40.0000 mg | SUBCUTANEOUS | Status: DC
  Administered 2018-02-08 – 2018-02-09 (×2): 40 mg via SUBCUTANEOUS
  Filled 2018-02-08 (×2): qty 0.4

## 2018-02-08 MED ORDER — ADULT MULTIVITAMIN W/MINERALS CH
1.0000 | ORAL_TABLET | Freq: Every day | ORAL | Status: DC
  Administered 2018-02-08 – 2018-02-10 (×3): 1 via ORAL
  Filled 2018-02-08 (×3): qty 1

## 2018-02-08 MED ORDER — SODIUM CHLORIDE 0.9 % IV BOLUS
1000.0000 mL | Freq: Once | INTRAVENOUS | Status: AC
Start: 2018-02-08 — End: 2018-02-08
  Administered 2018-02-08: 1000 mL via INTRAVENOUS

## 2018-02-08 MED ORDER — ACETAMINOPHEN 650 MG RE SUPP: 650.0000 mg | Freq: Four times a day (QID) | RECTAL | Status: DC | PRN

## 2018-02-08 MED ORDER — HYDRALAZINE HCL 20 MG/ML IJ SOLN: 10.0000 mg | Freq: Four times a day (QID) | INTRAMUSCULAR | Status: DC | PRN

## 2018-02-08 MED ORDER — VITAMIN B-1 100 MG PO TABS
100.0000 mg | ORAL_TABLET | Freq: Every day | ORAL | Status: DC
  Administered 2018-02-08 – 2018-02-10 (×3): 100 mg via ORAL
  Filled 2018-02-08 (×3): qty 1

## 2018-02-08 MED ORDER — ALBUTEROL SULFATE (2.5 MG/3ML) 0.083% IN NEBU: 2.5000 mg | INHALATION_SOLUTION | RESPIRATORY_TRACT | Status: DC | PRN

## 2018-02-08 MED ORDER — GABAPENTIN 300 MG PO CAPS
900.0000 mg | ORAL_CAPSULE | Freq: Every day | ORAL | Status: DC
  Administered 2018-02-08 – 2018-02-09 (×2): 900 mg via ORAL
  Filled 2018-02-08 (×2): qty 3

## 2018-02-08 MED ORDER — ONDANSETRON HCL 4 MG/2ML IJ SOLN: 4.0000 mg | Freq: Four times a day (QID) | INTRAMUSCULAR | Status: DC | PRN

## 2018-02-08 MED ORDER — ACETAMINOPHEN 325 MG PO TABS: 650.0000 mg | ORAL_TABLET | Freq: Four times a day (QID) | ORAL | Status: DC | PRN

## 2018-02-08 MED ORDER — FOLIC ACID 1 MG PO TABS
1.0000 mg | ORAL_TABLET | Freq: Every day | ORAL | Status: DC
  Administered 2018-02-08 – 2018-02-10 (×3): 1 mg via ORAL
  Filled 2018-02-08 (×3): qty 1

## 2018-02-08 MED ORDER — ONDANSETRON HCL 4 MG PO TABS: 4.0000 mg | ORAL_TABLET | Freq: Four times a day (QID) | ORAL | Status: DC | PRN

## 2018-02-08 MED ORDER — THIAMINE HCL 100 MG/ML IJ SOLN: 100.0000 mg | Freq: Every day | INTRAMUSCULAR | Status: DC

## 2018-02-08 MED ORDER — VANCOMYCIN HCL IN DEXTROSE 1-5 GM/200ML-% IV SOLN
1000.0000 mg | Freq: Once | INTRAVENOUS | Status: AC
  Administered 2018-02-08: 1000 mg via INTRAVENOUS
  Filled 2018-02-08: qty 200

## 2018-02-08 MED ORDER — TAMSULOSIN HCL 0.4 MG PO CAPS
0.4000 mg | ORAL_CAPSULE | Freq: Every day | ORAL | Status: DC
  Administered 2018-02-08 – 2018-02-10 (×3): 0.4 mg via ORAL
  Filled 2018-02-08 (×3): qty 1

## 2018-02-08 MED ORDER — INSULIN ASPART 100 UNIT/ML ~~LOC~~ SOLN
0.0000 [IU] | Freq: Every day | SUBCUTANEOUS | Status: DC
  Administered 2018-02-08: 2 [IU] via SUBCUTANEOUS
  Administered 2018-02-09: 3 [IU] via SUBCUTANEOUS
  Filled 2018-02-08 (×2): qty 1

## 2018-02-08 MED ORDER — LOSARTAN POTASSIUM 25 MG PO TABS
25.0000 mg | ORAL_TABLET | Freq: Every day | ORAL | Status: DC
  Administered 2018-02-08 – 2018-02-10 (×3): 25 mg via ORAL
  Filled 2018-02-08 (×3): qty 1

## 2018-02-08 MED ORDER — INSULIN ASPART 100 UNIT/ML ~~LOC~~ SOLN
0.0000 [IU] | Freq: Three times a day (TID) | SUBCUTANEOUS | Status: DC
  Administered 2018-02-09 – 2018-02-10 (×3): 2 [IU] via SUBCUTANEOUS
  Administered 2018-02-10: 5 [IU] via SUBCUTANEOUS
  Filled 2018-02-08 (×3): qty 1

## 2018-02-08 MED ORDER — LORAZEPAM 1 MG PO TABS: 1.0000 mg | ORAL_TABLET | Freq: Four times a day (QID) | ORAL | Status: DC | PRN

## 2018-02-08 MED ORDER — BISACODYL 5 MG PO TBEC: 5.0000 mg | DELAYED_RELEASE_TABLET | Freq: Every day | ORAL | Status: DC | PRN

## 2018-02-08 MED ORDER — ASPIRIN 81 MG PO CHEW
81.0000 mg | CHEWABLE_TABLET | Freq: Every day | ORAL | Status: DC
  Administered 2018-02-08 – 2018-02-10 (×3): 81 mg via ORAL
  Filled 2018-02-08 (×3): qty 1

## 2018-02-08 MED ORDER — LORAZEPAM 2 MG/ML IJ SOLN: 1.0000 mg | Freq: Four times a day (QID) | INTRAMUSCULAR | Status: DC | PRN

## 2018-02-08 NOTE — ED Notes (Signed)
Dr. Chen at bedside.

## 2018-02-08 NOTE — ED Notes (Signed)
Pt is alert, oriented, talking in complete sentences. States that he feels better. Doesn't remember coming to ED earlier. Pt informed of plan of care.

## 2018-02-08 NOTE — Progress Notes (Signed)
CRITICAL VALUE ALERT  Critical Value:  Lactic acid 2.0  Date & Time Notied:  1550  Provider Notified: yes  Orders Received/Actions taken: no

## 2018-02-08 NOTE — H&P (Addendum)
[facility] [place] at [facility]   PATIENT NAME: Maurice Jordan    MR#:  [id number]  DATE OF BIRTH:  1956-05-14  DATE OF ADMISSION:  02/08/2018  PRIMARY CARE PHYSICIAN: Theodosia Blender, MD   REQUESTING/REFERRING PHYSICIAN: Dionne Bucy, MD  CHIEF COMPLAINT:   Chief Complaint  Patient presents with  . Hypoglycemia   Hypoglycemia and confusion. HISTORY OF PRESENT ILLNESS:  Maurice Jordan  is a 62 y.o. male with a known history of multiple medical problems as below.  The patient was sent to ED due to above chief complaints.  Patient now is awake alert but he does not know why he is here.  Per EMS, the patient roommate found him lying on the floor.  He was found hypoglycemia and was given D50 by EMS.  The patient was at his baseline last night.  He was found hypothermia and hyponatremia in the ED.  The patient has no complaints except for generalized weakness.  PAST MEDICAL HISTORY:   Past Medical History:  Diagnosis Date  . Alcohol abuse   . Congestive heart failure (HCC)   . Depression   . Diabetes (HCC)   . Hx MRSA infection 04/15/2014  . Hyperlipidemia   . Hypertension   . Neuropathy   . Osteomyelitis of toe (HCC)    3rd toe    PAST SURGICAL HISTORY:   Past Surgical History:  Procedure Laterality Date  . AMPUTATION OF REPLICATED TOES      SOCIAL HISTORY:   Social History   Tobacco Use  . Smoking status: Never Smoker  . Smokeless tobacco: Former Engineer, water Use Topics  . Alcohol use: Yes    Alcohol/week: 4.8 oz    Types: 8 Cans of beer per week    FAMILY HISTORY:   Family History  Problem Relation Age of Onset  . Diabetes Mother   . Hypertension Mother     DRUG ALLERGIES:  No Known Allergies  REVIEW OF SYSTEMS:   Review of Systems  Constitutional: Positive for malaise/fatigue. Negative for chills and fever.  HENT: Negative for sore throat.   Eyes: Negative for blurred vision and double vision.    Respiratory: Negative for cough, hemoptysis, shortness of breath, wheezing and stridor.   Cardiovascular: Negative for chest pain, palpitations, orthopnea and leg swelling.  Gastrointestinal: Negative for abdominal pain, blood in stool, diarrhea, melena, nausea and vomiting.  Genitourinary: Negative for dysuria, flank pain and hematuria.  Musculoskeletal: Negative for back pain and joint pain.  Skin: Negative for rash.  Neurological: Negative for dizziness, sensory change, focal weakness, seizures, loss of consciousness, weakness and headaches.  Endo/Heme/Allergies: Negative for polydipsia.  Psychiatric/Behavioral: Negative for depression. The patient is not nervous/anxious.     MEDICATIONS AT HOME:   Prior to Admission medications   Medication Sig Start Date End Date Taking? Authorizing Provider  aspirin 81 MG chewable tablet Chew 81 mg by mouth daily.   Yes [provider]  atorvastatin (LIPITOR) 80 MG tablet Take 1 tablet (80 mg total) by mouth every evening. 05/20/17  Yes Enid Baas, MD  carvedilol (COREG) 6.25 MG tablet Take 1 tablet (6.25 mg total) by mouth 2 (two) times daily. 05/20/17  Yes Enid Baas, MD  citalopram (CELEXA) 20 MG tablet Take 1 tablet (20 mg total) by mouth daily. 05/20/17  Yes Enid Baas, MD  furosemide (LASIX) 20 MG tablet Take 20 mg by mouth 2 (two) times daily.    Yes [provider]  gabapentin (  NEURONTIN) 300 MG capsule Take 900 mg by mouth at bedtime.   Yes [provider]  insulin glargine (LANTUS) 100 UNIT/ML injection Inject 33 Units into the skin at bedtime.    Yes [provider]  losartan (COZAAR) 25 MG tablet Take 25 mg by mouth daily.   Yes [provider]  tamsulosin (FLOMAX) 0.4 MG CAPS capsule Take 1 capsule (0.4 mg total) by mouth daily. 05/20/17  Yes Enid Baas, MD  albuterol (PROVENTIL HFA;VENTOLIN HFA) 108 (90 Base) MCG/ACT inhaler Inhale 2 puffs into the lungs every 6  (six) hours as needed for wheezing or shortness of breath. Patient not taking: Reported on 12/31/2017 11/30/17   Ramonita Lab, MD  aspirin EC 81 MG tablet Take 1 tablet (81 mg total) by mouth daily. Patient not taking: Reported on 01/14/2018 05/20/17   Enid Baas, MD      VITAL SIGNS:  Blood pressure (!) 176/123, pulse 78, temperature 97.6 F (36.4 C), temperature source Oral, resp. rate 15, SpO2 100 %.  PHYSICAL EXAMINATION:  Physical Exam  GENERAL:  62 y.o.-year-old patient lying in the bed with no acute distress.  EYES: Pupils equal, round, reactive to light and accommodation. No scleral icterus. Extraocular muscles intact.  HEENT: Head atraumatic, normocephalic. Oropharynx and nasopharynx clear.  NECK:  Supple, no jugular venous distention. No thyroid enlargement, no tenderness.  LUNGS: Normal breath sounds bilaterally, no wheezing, rales,rhonchi or crepitation. No use of accessory muscles of respiration.  CARDIOVASCULAR: S1, S2 normal. No murmurs, rubs, or gallops.  ABDOMEN: Soft, nontender, nondistended. Bowel sounds present. No organomegaly or mass.  EXTREMITIES: No pedal edema, cyanosis, or clubbing.  NEUROLOGIC: Cranial nerves II through XII are intact. Muscle strength 5/5 in all extremities. Sensation intact. Gait not checked.  PSYCHIATRIC: The patient is alert and oriented x 3.  SKIN: No obvious rash, lesion, or ulcer.   LABORATORY PANEL:   CBC Recent Labs  Lab 02/08/18 1159  WBC 3.2*  HGB 16.4  HCT 48.4  PLT 199   ------------------------------------------------------------------------------------------------------------------  Chemistries  Recent Labs  Lab 02/08/18 1159  NA 128*  K 5.1  CL 97*  CO2 19*  GLUCOSE 379*  BUN 27*  CREATININE 1.38*  CALCIUM 9.1  AST 53*  ALT 49  ALKPHOS 96  BILITOT 1.0   ------------------------------------------------------------------------------------------------------------------  Cardiac Enzymes Recent Labs    Lab 02/08/18 1159  TROPONINI <0.03   ------------------------------------------------------------------------------------------------------------------  RADIOLOGY:  Ct Head Wo Contrast  Result Date: 02/08/2018 CLINICAL DATA:  Altered mental status. Hypoglycemia. History of alcohol abuse. EXAM: CT HEAD WITHOUT CONTRAST TECHNIQUE: Contiguous axial images were obtained from the base of the skull through the vertex without intravenous contrast. COMPARISON:  08/23/2017 FINDINGS: Brain: There is no evidence of acute infarct, intracranial hemorrhage, mass, midline shift, or extra-axial fluid collection. Mild cerebral atrophy is unchanged. Minimal cerebral white matter hypoattenuation is most notable in the right frontal lobe and is unchanged and nonspecific but compatible with chronic small vessel ischemia. Vascular: Calcified atherosclerosis at the skull base. No hyperdense vessel. Skull: No fracture or focal osseous lesion. Sinuses/Orbits: Single opacified posterior right ethmoid air cell. Clear mastoid air cells. Unremarkable included orbits. Other: Unchanged focal posterior scalp nonspecific soft tissue thickening/scarring. IMPRESSION: No evidence of acute intracranial abnormality. Electronically Signed   By: Sebastian Ache M.D.   On: 02/08/2018 12:40   Dg Chest Portable 1 View  Result Date: 02/08/2018 CLINICAL DATA:  Per nurse note: Pt arrives [facility] from home where roommate found pt lying on floor. CBG  28. EMS gave 50g D50, CBG 104. Pt is cold to touch. Hx ETOH abuse. Pt will re-state what you say to him but will not answer questions. EXAM: PORTABLE CHEST 1 VIEW COMPARISON:  To 06/2018 FINDINGS: The heart size and mediastinal contours are within normal limits. Both lungs are clear. The visualized skeletal structures are unremarkable. IMPRESSION: No active disease. Electronically Signed   By: Norva Pavlov M.D.   On: 02/08/2018 12:02      IMPRESSION AND PLAN:   Hyponatremia and hypothermia. The  patient will be admitted to medical floor. Continue warm blanket, start normal saline IV and follow-up BMP.  Hold Lasix.  Acute renal failure.  Due to dehydration.  Hold Lasix and IV fluid support.  Follow-up BMP.  Acute metabolic encephalopathy due to above. Aspiration fall precaution.  Hypertension, accelerated.  Continue home hypertension medication.  IV hydralazine as needed.  Hypoglycemia and history of diabetes 2. Patient was given D50 in the ED. Hold home Lantus, start sliding scale.   Alcohol abuse.  CIWA protocol.    All the records are reviewed and case discussed with ED provider. Management plans discussed with the patient, family and they are in agreement.  CODE STATUS: Full code  TOTAL TIME TAKING CARE OF THIS PATIENT: 55 minutes.    Shaune Pollack M.D on 02/08/2018 at 3:03 PM  Between 7am to 6pm - Pager - [phone]  After 6pm go to [email or web address] - Social research officer, government  [facility] [place] [facility]  Office  [phone]  CC: Primary care physician; Theodosia Blender, MD   Note: This dictation was prepared with Dragon dictation along with smaller phrase technology. Any transcriptional errors that result from this process are unin

## 2018-02-08 NOTE — ED Notes (Signed)
Maurice Jordan [phone] sister   Veva Holes [phone] fiance

## 2018-02-08 NOTE — ED Triage Notes (Signed)
Pt arrives [facility] from home where roommate found pt lying on floor. CBG 28. EMS gave 50g D50, CBG 104. Pt is cold to touch. Hx ETOH abuse. Pt will re-state what you say to him but will not answer questions. EMS states pt has soiled self.  Last known well is last night sometime. A fib on monitor for EMS.

## 2018-02-08 NOTE — ED Notes (Signed)
bair hugger applied to pt at this time 

## 2018-02-08 NOTE — ED Notes (Signed)
Date and time results received: 02/08/18 1326   Test: lactic acid Critical Value: 2.4  Name of Provider Notified: Dr. Marisa Severin

## 2018-02-08 NOTE — ED Provider Notes (Signed)
[facility] Emergency Department Provider Note ____________________________________________   First MD Initiated Contact with Patient 02/08/18 1129     (approximate)  I have reviewed the triage vital signs and the nursing notes.   HISTORY  Chief Complaint Hypoglycemia  Level 5 caveat: History of present illness significantly limited due to altered mental status  HPI Eward Helou is a 62 y.o. male with PMH as noted below who presents with altered mental status.  Per EMS, the patient's roommate found him lying on the floor.  He was noted by EMS to be hypoglycemic and was given D50.  He was last seen at his baseline sometime last night.  The patient is able to state his name but is unable to give any other history.  Past Medical History:  Diagnosis Date  . Alcohol abuse   . Congestive heart failure (HCC)   . Depression   . Diabetes (HCC)   . Hx MRSA infection 04/15/2014  . Hyperlipidemia   . Hypertension   . Neuropathy   . Osteomyelitis of toe (HCC)    3rd toe    Patient Active Problem List   Diagnosis Date Noted  . Fall 01/14/2018  . Influenza A 12/04/2017  . SIRS (systemic inflammatory response syndrome) (HCC) 08/23/2017  . Hypothermia 08/23/2017  . Hypotension 05/30/2017  . Hyponatremia 03/11/2015  . Chronic systolic congestive heart failure (HCC) 03/11/2015  . Polysubstance abuse (HCC) 03/11/2015  . Alcohol abuse 03/11/2015  . Hypertension 03/11/2015  . Diabetes mellitus (HCC) 03/11/2015  . Rhabdomyolysis 03/11/2015    Past Surgical History:  Procedure Laterality Date  . AMPUTATION OF REPLICATED TOES      Prior to Admission medications   Medication Sig Start Date End Date Taking? Authorizing Provider  aspirin 81 MG chewable tablet Chew 81 mg by mouth daily.   Yes [provider]  atorvastatin (LIPITOR) 80 MG tablet Take 1 tablet (80 mg total) by mouth every evening. 05/20/17  Yes Enid Baas, MD  carvedilol  (COREG) 6.25 MG tablet Take 1 tablet (6.25 mg total) by mouth 2 (two) times daily. 05/20/17  Yes Enid Baas, MD  citalopram (CELEXA) 20 MG tablet Take 1 tablet (20 mg total) by mouth daily. 05/20/17  Yes Enid Baas, MD  furosemide (LASIX) 20 MG tablet Take 20 mg by mouth 2 (two) times daily.    Yes [provider]  gabapentin (NEURONTIN) 300 MG capsule Take 900 mg by mouth at bedtime.   Yes [provider]  insulin glargine (LANTUS) 100 UNIT/ML injection Inject 33 Units into the skin at bedtime.    Yes [provider]  losartan (COZAAR) 25 MG tablet Take 25 mg by mouth daily.   Yes [provider]  tamsulosin (FLOMAX) 0.4 MG CAPS capsule Take 1 capsule (0.4 mg total) by mouth daily. 05/20/17  Yes Enid Baas, MD  albuterol (PROVENTIL HFA;VENTOLIN HFA) 108 (90 Base) MCG/ACT inhaler Inhale 2 puffs into the lungs every 6 (six) hours as needed for wheezing or shortness of breath. Patient not taking: Reported on 12/31/2017 11/30/17   Ramonita Lab, MD  aspirin EC 81 MG tablet Take 1 tablet (81 mg total) by mouth daily. Patient not taking: Reported on 01/14/2018 05/20/17   Enid Baas, MD    Allergies Patient has no known allergies.  History reviewed. No pertinent family history.  Social History Social History   Tobacco Use  . Smoking status: Never Smoker  . Smokeless tobacco: Former Engineer, water Use Topics  .  Alcohol use: Yes    Alcohol/week: 4.8 oz    Types: 8 Cans of beer per week  . Drug use: No    Review of Systems Level 5 caveat: Unable to obtain review of systems due to altered mental status    ____________________________________________   PHYSICAL EXAM:  VITAL SIGNS: ED Triage Vitals  Enc Vitals Group     BP 02/08/18 1126 (!) 135/96     Pulse Rate 02/08/18 1126 64     Resp 02/08/18 1126 20     Temp 02/08/18 1126 (!) 89.3 F (31.8 C)     Temp Source 02/08/18 1126 Rectal     SpO2 02/08/18 1126 100 %      Weight --      Height --      Head Circumference --      Peak Flow --      Pain Score 02/08/18 1136 0     Pain Loc --      Pain Edu? --      Excl. in GC? --     Constitutional: Somnolent but arousable to loud voice.  Following commands. Eyes: Conjunctivae are normal.  EOMI.  PERRLA. Head: Atraumatic. Nose: No congestion/rhinnorhea. Mouth/Throat: Mucous membranes are somewhat dry.   Neck: Normal range of motion.  Cardiovascular: Normal rate, regular rhythm. Grossly normal heart sounds.  Good peripheral circulation. Respiratory: Normal respiratory effort.  No retractions. Lu slightly decreased breath sounds bilaterally. Gastrointestinal: Soft and nontender. No distention.  Genitourinary: No flank tenderness. Musculoskeletal: No lower extremity edema.  Extremities warm and well perfused.  Neurologic: Motor intact in all extremities. Skin:  Skin is cool and dry. No rash noted. Psychiatric: Unable to assess due to altered mental status.  ____________________________________________   LABS (all labs ordered are listed, but only abnormal results are displayed)  Labs Reviewed  GLUCOSE, CAPILLARY - Abnormal; Notable for the following components:      Result Value   Glucose-Capillary 148 (*)    All other components within normal limits  COMPREHENSIVE METABOLIC PANEL - Abnormal; Notable for the following components:   Sodium 128 (*)    Chloride 97 (*)    CO2 19 (*)    Glucose, Bld 379 (*)    BUN 27 (*)    Creatinine, Ser 1.38 (*)    Total Protein 8.9 (*)    AST 53 (*)    GFR calc non Af Amer 53 (*)    All other components within normal limits  ETHANOL - Abnormal; Notable for the following components:   Alcohol, Ethyl (B) 88 (*)    All other components within normal limits  LACTIC ACID, PLASMA - Abnormal; Notable for the following components:   Lactic Acid, Venous 2.4 (*)    All other components within normal limits  CBC WITH DIFFERENTIAL/PLATELET - Abnormal; Notable for the  following components:   WBC 3.2 (*)    Lymphs Abs 0.8 (*)    All other components within normal limits  CULTURE, BLOOD (ROUTINE X 2)  CULTURE, BLOOD (ROUTINE X 2)  TROPONIN I  LACTIC ACID, PLASMA  URINALYSIS, COMPLETE (UACMP) WITH MICROSCOPIC   ____________________________________________  EKG  ED ECG REPORT I, Dionne Bucy, the attending physician, personally viewed and interpreted this ECG.  Date: 02/08/2018 EKG Time: 1124 Rate: 63 Rhythm: normal sinus rhythm QRS Axis: normal Intervals: Nonspecific IVCD ST/T Wave abnormalities: LVH with repolarization abnormality Narrative Interpretation: no evidence of acute ischemia  ____________________________________________  RADIOLOGY  CXR: No focal infiltrate CT  head: No ICH or other acute abnormalities  ____________________________________________   PROCEDURES  Procedure(s) performed: No  Procedures  Critical Care performed: No ____________________________________________   INITIAL IMPRESSION / ASSESSMENT AND PLAN / ED COURSE  Pertinent labs & imaging results that were available during my care of the patient were reviewed by me and considered in my medical decision making (see chart for details).  62 year old male with history of alcohol abuse and other PMH as noted above presents with altered mental status, found on the floor this morning by roommates.  He was noted to be hypoglycemic by EMS.  I reviewed the past medical records in Epic; the patient was admitted last November for metabolic encephalopathy, and in February of this year for influenza and an STEMI.  On exam, the patient (whose glucose in the ED is now 148) is somnolent but arousable to voice and able to follow some commands.  He is not really answering questions except stating his name.  He is cool to the touch and his temperature is 89.3.  Other vital signs are within normal limits.  Neuro exam is nonfocal, and there is no evidence of  trauma.  Differential diagnosis is broad but I am primarily concerned for metabolic etiologies such as alcohol intoxication, AKA, dehydration or other electrolyte abnormality, or less likely infection/sepsis.  I suspect that the hypoglycemia and hypothermia are secondary to 1 of these etiologies.  At this time given the broad differential and lack of infectious source, there is no indication for empiric antibiotics but if work-up is consistent with sepsis I will initiate them.    ----------------------------------------- 2:30 PM on 02/08/2018 -----------------------------------------  Patient's lactate was elevated.  Given this finding and the hypothermia I decided to go ahead and initiate empiric IV antibiotics for possible sepsis.  His work-up also reveals hyponatremia.  I suspect that most likely patient became intoxicated and then was on the floor and became hypothermic.  Patient is becoming more arousable on exam.  He will require admission for IV fluids, treatment of hyponatremia, and monitoring of his glucose.  I signed the patient out to the hospitalist Dr. Imogene Burn.  ____________________________________________   FINAL CLINICAL IMPRESSION(S) / ED DIAGNOSES  Final diagnoses:  Sepsis, due to unspecified organism (HCC)  Hypothermia, initial encounter  Hypoglycemia  Hyponatremia  Altered mental status, unspecified altered mental status type      NEW MEDICATIONS STARTED DURING THIS VISIT:  New Prescriptions   No medications on file     Note:  This document was prepared using Dragon voice recognition software and may include unintentional dictation errors.    Dionne Bucy, MD 02/08/18 1433

## 2018-02-09 LAB — BLOOD CULTURE ID PANEL (REFLEXED)
Acinetobacter baumannii: NOT DETECTED
CANDIDA GLABRATA: NOT DETECTED
CANDIDA TROPICALIS: NOT DETECTED
Candida albicans: NOT DETECTED
Candida krusei: NOT DETECTED
Candida parapsilosis: NOT DETECTED
ENTEROBACTER CLOACAE COMPLEX: NOT DETECTED
Enterobacteriaceae species: NOT DETECTED
Enterococcus species: NOT DETECTED
Escherichia coli: NOT DETECTED
Haemophilus influenzae: NOT DETECTED
KLEBSIELLA PNEUMONIAE: NOT DETECTED
Klebsiella oxytoca: NOT DETECTED
Listeria monocytogenes: NOT DETECTED
Methicillin resistance: NOT DETECTED
Neisseria meningitidis: NOT DETECTED
PROTEUS SPECIES: NOT DETECTED
Pseudomonas aeruginosa: NOT DETECTED
SERRATIA MARCESCENS: NOT DETECTED
STAPHYLOCOCCUS AUREUS BCID: NOT DETECTED
STAPHYLOCOCCUS SPECIES: DETECTED — AB
STREPTOCOCCUS SPECIES: NOT DETECTED
Streptococcus agalactiae: NOT DETECTED
Streptococcus pneumoniae: NOT DETECTED
Streptococcus pyogenes: NOT DETECTED

## 2018-02-09 LAB — URINALYSIS, COMPLETE (UACMP) WITH MICROSCOPIC
Bilirubin Urine: NEGATIVE
GLUCOSE, UA: 50 mg/dL — AB
HGB URINE DIPSTICK: NEGATIVE
Ketones, ur: NEGATIVE mg/dL
Leukocytes, UA: NEGATIVE
NITRITE: NEGATIVE
PROTEIN: 30 mg/dL — AB
Specific Gravity, Urine: 1.013 (ref 1.005–1.030)
pH: 5 (ref 5.0–8.0)

## 2018-02-09 LAB — CBC
HCT: 35.1 % — ABNORMAL LOW (ref 40.0–52.0)
Hemoglobin: 12.3 g/dL — ABNORMAL LOW (ref 13.0–18.0)
MCH: 31.7 pg (ref 26.0–34.0)
MCHC: 35 g/dL (ref 32.0–36.0)
MCV: 90.5 fL (ref 80.0–100.0)
Platelets: 165 10*3/uL (ref 150–440)
RBC: 3.88 MIL/uL — AB (ref 4.40–5.90)
RDW: 13.7 % (ref 11.5–14.5)
WBC: 3.7 10*3/uL — ABNORMAL LOW (ref 3.8–10.6)

## 2018-02-09 LAB — GLUCOSE, CAPILLARY
GLUCOSE-CAPILLARY: 121 mg/dL — AB (ref 65–99)
GLUCOSE-CAPILLARY: 184 mg/dL — AB (ref 65–99)
Glucose-Capillary: 189 mg/dL — ABNORMAL HIGH (ref 65–99)
Glucose-Capillary: 273 mg/dL — ABNORMAL HIGH (ref 65–99)

## 2018-02-09 LAB — BASIC METABOLIC PANEL
Anion gap: 5 (ref 5–15)
BUN: 31 mg/dL — AB (ref 6–20)
CHLORIDE: 104 mmol/L (ref 101–111)
CO2: 23 mmol/L (ref 22–32)
CREATININE: 1.99 mg/dL — AB (ref 0.61–1.24)
Calcium: 8.2 mg/dL — ABNORMAL LOW (ref 8.9–10.3)
GFR calc Af Amer: 39 mL/min — ABNORMAL LOW (ref >60)
GFR calc non Af Amer: 34 mL/min — ABNORMAL LOW (ref >60)
GLUCOSE: 88 mg/dL (ref 65–99)
Potassium: 4.4 mmol/L (ref 3.5–5.1)
Sodium: 132 mmol/L — ABNORMAL LOW (ref 135–145)

## 2018-02-09 LAB — URINE DRUG SCREEN, QUALITATIVE (ARMC ONLY)
Amphetamines, Ur Screen: NOT DETECTED
BARBITURATES, UR SCREEN: NOT DETECTED
BENZODIAZEPINE, UR SCRN: NOT DETECTED
CANNABINOID 50 NG, UR ~~LOC~~: NOT DETECTED
Cocaine Metabolite,Ur ~~LOC~~: POSITIVE — AB
MDMA (Ecstasy)Ur Screen: NOT DETECTED
METHADONE SCREEN, URINE: NOT DETECTED
OPIATE, UR SCREEN: NOT DETECTED
PHENCYCLIDINE (PCP) UR S: NOT DETECTED
Tricyclic, Ur Screen: NOT DETECTED

## 2018-02-09 NOTE — Progress Notes (Signed)
PHARMACY - PHYSICIAN COMMUNICATION CRITICAL VALUE ALERT - BLOOD CULTURE IDENTIFICATION (BCID)  Maurice Jordan is an 62 y.o. male who presented to [facility] on 02/08/2018 with a chief complaint of hyponatremia   Name of physician (or Provider) Contacted: Sainani   Current antibiotics: N/A  Changes to prescribed antibiotics recommended:  Likely contaminant; no antibiotics at this time.   Results for orders placed or performed during the hospital encounter of 02/08/18  Blood Culture ID Panel (Reflexed) (Collected: 02/08/2018 12:00 PM)  Result Value Ref Range   Enterococcus species NOT DETECTED NOT DETECTED   Listeria monocytogenes NOT DETECTED NOT DETECTED   Staphylococcus species DETECTED (A) NOT DETECTED   Staphylococcus aureus NOT DETECTED NOT DETECTED   Methicillin resistance NOT DETECTED NOT DETECTED   Streptococcus species NOT DETECTED NOT DETECTED   Streptococcus agalactiae NOT DETECTED NOT DETECTED   Streptococcus pneumoniae NOT DETECTED NOT DETECTED   Streptococcus pyogenes NOT DETECTED NOT DETECTED   Acinetobacter baumannii NOT DETECTED NOT DETECTED   Enterobacteriaceae species NOT DETECTED NOT DETECTED   Enterobacter cloacae complex NOT DETECTED NOT DETECTED   Escherichia coli NOT DETECTED NOT DETECTED   Klebsiella oxytoca NOT DETECTED NOT DETECTED   Klebsiella pneumoniae NOT DETECTED NOT DETECTED   Proteus species NOT DETECTED NOT DETECTED   Serratia marcescens NOT DETECTED NOT DETECTED   Haemophilus influenzae NOT DETECTED NOT DETECTED   Neisseria meningitidis NOT DETECTED NOT DETECTED   Pseudomonas aeruginosa NOT DETECTED NOT DETECTED   Candida albicans NOT DETECTED NOT DETECTED   Candida glabrata NOT DETECTED NOT DETECTED   Candida krusei NOT DETECTED NOT DETECTED   Candida parapsilosis NOT DETECTED NOT DETECTED   Candida tropicalis NOT DETECTED NOT DETECTED    [name] L 02/09/2018  9:17 AM

## 2018-02-09 NOTE — Progress Notes (Signed)
[facility] [place] at [facility]   PATIENT NAME: Maurice Jordan    MR#:  [id number]  DATE OF BIRTH:  11-22-1955  SUBJECTIVE:   Patient here due to hypothermia and hypoglycemia as he was found down.  Hypoglycemia is improved, no further hypothermia.  Mental status much improved, patient has not required any medications for CIWA protocol.  REVIEW OF SYSTEMS:    Review of Systems  Constitutional: Negative for chills and fever.  HENT: Negative for congestion and tinnitus.   Eyes: Negative for blurred vision and double vision.  Respiratory: Negative for cough, shortness of breath and wheezing.   Cardiovascular: Negative for chest pain, orthopnea and PND.  Gastrointestinal: Negative for abdominal pain, diarrhea, nausea and vomiting.  Genitourinary: Negative for dysuria and hematuria.  Neurological: Negative for dizziness, sensory change and focal weakness.  All other systems reviewed and are negative.   Nutrition: Heart Healthy/Carb control Tolerating Diet: Yes Tolerating PT: Await Eval.   DRUG ALLERGIES:  No Known Allergies  VITALS:  Blood pressure 111/70, pulse 81, temperature 98.3 F (36.8 C), temperature source Oral, resp. rate 18, height 5\' 7"  (1.702 m), weight 80.6 kg (177 lb 11.1 oz), SpO2 99 %.  PHYSICAL EXAMINATION:   Physical Exam  GENERAL:  62 y.o.-year-old patient lying in bed in no acute distress.  EYES: Pupils equal, round, reactive to light and accommodation. No scleral icterus. Extraocular muscles intact.  HEENT: Head atraumatic, normocephalic. Oropharynx and nasopharynx clear.  NECK:  Supple, no jugular venous distention. No thyroid enlargement, no tenderness.  LUNGS: Normal breath sounds bilaterally, no wheezing, rales, rhonchi. No use of accessory muscles of respiration.  CARDIOVASCULAR: S1, S2 normal. No murmurs, rubs, or gallops.  ABDOMEN: Soft, nontender, nondistended. Bowel sounds present. No organomegaly or mass.  EXTREMITIES: No  cyanosis, clubbing or edema b/l.    NEUROLOGIC: Cranial nerves II through XII are intact. No focal Motor or sensory deficits b/l.   PSYCHIATRIC: The patient is alert and oriented x 3.  SKIN: No obvious rash, lesion, or ulcer.    LABORATORY PANEL:   CBC Recent Labs  Lab 02/09/18 0458  WBC 3.7*  HGB 12.3*  HCT 35.1*  PLT 165   ------------------------------------------------------------------------------------------------------------------  Chemistries  Recent Labs  Lab 02/08/18 1159 02/09/18 0458  NA 128* 132*  K 5.1 4.4  CL 97* 104  CO2 19* 23  GLUCOSE 379* 88  BUN 27* 31*  CREATININE 1.38* 1.99*  CALCIUM 9.1 8.2*  MG 1.9  --   AST 53*  --   ALT 49  --   ALKPHOS 96  --   BILITOT 1.0  --    ------------------------------------------------------------------------------------------------------------------  Cardiac Enzymes Recent Labs  Lab 02/08/18 1159  TROPONINI <0.03   ------------------------------------------------------------------------------------------------------------------  RADIOLOGY:  Ct Head Wo Contrast  Result Date: 02/08/2018 CLINICAL DATA:  Altered mental status. Hypoglycemia. History of alcohol abuse. EXAM: CT HEAD WITHOUT CONTRAST TECHNIQUE: Contiguous axial images were obtained from the base of the skull through the vertex without intravenous contrast. COMPARISON:  08/23/2017 FINDINGS: Brain: There is no evidence of acute infarct, intracranial hemorrhage, mass, midline shift, or extra-axial fluid collection. Mild cerebral atrophy is unchanged. Minimal cerebral white matter hypoattenuation is most notable in the right frontal lobe and is unchanged and nonspecific but compatible with chronic small vessel ischemia. Vascular: Calcified atherosclerosis at the skull base. No hyperdense vessel. Skull: No fracture or focal osseous lesion. Sinuses/Orbits: Single opacified posterior right ethmoid air cell. Clear mastoid air cells. Unremarkable included orbits.  Other:  Unchanged focal posterior scalp nonspecific soft tissue thickening/scarring. IMPRESSION: No evidence of acute intracranial abnormality. Electronically Signed   By: Sebastian Ache M.D.   On: 02/08/2018 12:40   Dg Chest Portable 1 View  Result Date: 02/08/2018 CLINICAL DATA:  Per nurse note: Pt arrives [facility] from home where roommate found pt lying on floor. CBG 28. EMS gave 50g D50, CBG 104. Pt is cold to touch. Hx ETOH abuse. Pt will re-state what you say to him but will not answer questions. EXAM: PORTABLE CHEST 1 VIEW COMPARISON:  To 06/2018 FINDINGS: The heart size and mediastinal contours are within normal limits. Both lungs are clear. The visualized skeletal structures are unremarkable. IMPRESSION: No active disease. Electronically Signed   By: Norva Pavlov M.D.   On: 02/08/2018 12:02     ASSESSMENT AND PLAN:   62 year old male with past medical history of essential hypertension, hyperlipidemia, depression, neuropathy, diabetes, history of CHF who presented to the hospital he was found down and noted to be hypo-glycemic and hypothermic.  1.  Hypoglycemia- etiology unclear, no evidence of acute sepsis.  Patient is diabetic regimen has not been recently changed. -Blood sugars much improved with dextrose.  Will get diabetes coordinator consult to see if patient's insulin needs to be adjusted.  Follow blood sugars closely.  Next  2.  Hypothermia-resolved.  Source unclear.  Patient was found down but no evidence of acute sepsis.  Patient was given empiric IV vancomycin, Zosyn x1 dose.  Will follow temperature.  3.  Alcohol abuse-continue CIWA protocol.  No evidence of alcohol withdrawal.  4.  Diabetes type 2 without complication-continue sliding scale insulin.  Follow blood sugars.  Was found hypoglycemic but much improved.  5.  Diabetic neuropathy-continue gabapentin.  6.  Essential hypertension-continue carvedilol, losartan.  7.  Depression-continue Celexa.  8.   Hyperlipidemia-continue atorvastatin.  9. BPH - cont. Flomax.    All the records are reviewed and case discussed with Care Management/Social Worker. Management plans discussed with the patient, family and they are in agreement.  CODE STATUS: full   DVT Prophylaxis: Lovenox  TOTAL TIME TAKING CARE OF THIS PATIENT: 30 minutes.   POSSIBLE D/C IN 1-2 DAYS, DEPENDING ON CLINICAL CONDITION.   Houston Siren M.D on 02/09/2018 at 1:21 PM  Between 7am to 6pm - Pager - [phone]  After 6pm go to [email or web address] - Social research officer, government  [facility] [place] [facility]  Office  [phone]  CC: Primary care physician; Theodosia Blender, MD

## 2018-02-10 LAB — BASIC METABOLIC PANEL
Anion gap: 3 — ABNORMAL LOW (ref 5–15)
BUN: 27 mg/dL — AB (ref 6–20)
CO2: 27 mmol/L (ref 22–32)
Calcium: 8.9 mg/dL (ref 8.9–10.3)
Chloride: 104 mmol/L (ref 101–111)
Creatinine, Ser: 1.62 mg/dL — ABNORMAL HIGH (ref 0.61–1.24)
GFR calc Af Amer: 51 mL/min — ABNORMAL LOW (ref >60)
GFR, EST NON AFRICAN AMERICAN: 44 mL/min — AB (ref >60)
GLUCOSE: 180 mg/dL — AB (ref 65–99)
POTASSIUM: 4.8 mmol/L (ref 3.5–5.1)
Sodium: 134 mmol/L — ABNORMAL LOW (ref 135–145)

## 2018-02-10 LAB — GLUCOSE, CAPILLARY
Glucose-Capillary: 168 mg/dL — ABNORMAL HIGH (ref 65–99)
Glucose-Capillary: 274 mg/dL — ABNORMAL HIGH (ref 65–99)

## 2018-02-10 MED ORDER — INSULIN GLARGINE 100 UNIT/ML ~~LOC~~ SOLN: 25.0000 [IU] | Freq: Every day | SUBCUTANEOUS | 11 refills | Status: AC

## 2018-02-10 NOTE — Progress Notes (Addendum)
Inpatient Diabetes Program Recommendations  AACE/ADA: New Consensus Statement on Inpatient Glycemic Control (2015)  Target Ranges:  Prepandial:   less than 140 mg/dL      Peak postprandial:   less than 180 mg/dL (1-2 hours)      Critically ill patients:  140 - 180 mg/dL   Results for Maurice Jordan, Maurice Jordan (MRN [id number]) as of 02/10/2018 07:43  Ref. Range 02/09/2018 09:10 02/09/2018 11:59 02/09/2018 16:43 02/09/2018 21:43 02/10/2018 07:37  Glucose-Capillary Latest Ref Range: 65 - 99 mg/dL 151 (H) 761 (H) 607 (H) 273 (H) 168 (H)   Review of Glycemic Control  Diabetes history: DM2 Outpatient Diabetes medications: Lantus 33 units QHS Current orders for Inpatient glycemic control: Novolog 0-9 units TID with meals, Novolog 0-5 units QHS  Inpatient Diabetes Program Recommendations: Insulin - Basal: Please consider ordering Lantus 5 units Q24H starting now. HgbA1C: A1C 8.5% on 02/08/18 indicating an average glucose of 197 mg/dl over the past 2-3 months. Anticipate Lantus dose needs to be decreased as an outpatient.   NOTE: Patient found down at home by roommate. EMS found patient to be hypoglycemic and he was treated with D50. Glucose ranged from 121-273 mg/dl on 3/71/06 with just Novolog correction (received a total of 7 units on 4/22). Will plan to talk with patient today--MB  Addendum 02/10/18@10 :40-Spoke with patient about diabetes and home regimen for diabetes control. Patient reports that he is followed by PCP for diabetes management and currently he takes Lantus 33 units QHS as an outpatient for diabetes control. Patient reports that he is taking insulin as prescribed and is consistently taking it every night before bed.  Patient states that he checks his glucose 2-3 times per day and that it is usually in the 200's mg/dl (reports lowest was 26 mg/dl this past Saturday and highest 315 mg/dl over this past week).  Inquired about prior A1C and patient reports that he does not recall his last A1C value.  Discussed A1C results (8.5% on 02/08/18) and explained that his current A1C indicates an average glucose of 197mg /dl over the past 2-3 months. Discussed glucose and A1C goals. Discussed importance of checking CBGs and maintaining good CBG control to prevent long-term and short-term complications. Reviewed hypoglycemia along with treatment. Patient states that he does not usually have any issues with hypoglycemia very often.  Patient states that he is eating 2 good meals per day (breakfast and supper).  Encouraged patient to check his glucose 4 times per day (before meals and at bedtime) and to keep a log book of glucose readings and insulin taken which he will need to take to doctor appointments. Explained how the doctor he follows up with can use the log book to continue to make insulin adjustments if needed. Explained that he may likely need Levemir dose decreased and asked that he pay attention to the discharge paperwork to see if he is instructed to take less units of the Levemir. Encouraged patient to contact PCP if he is having any further issues with hypoglycemia at home. Patient verbalized understanding of information discussed and he states that he has no further questions at this time related to diabetes.  Thanks, Orlando Penner, RN, MSN, CDE Diabetes Coordinator Inpatient Diabetes Program [phone] (Team Pager from 8am to 5pm)

## 2018-02-10 NOTE — Discharge Summary (Signed)
[facility] [place] at [facility]   PATIENT NAME: Maurice Jordan    MR#:  [id number]  DATE OF BIRTH:  Oct 01, 1956  DATE OF ADMISSION:  02/08/2018 ADMITTING PHYSICIAN: Shaune Pollack, MD  DATE OF DISCHARGE: 02/10/2018  PRIMARY CARE PHYSICIAN: Theodosia Blender, MD    ADMISSION DIAGNOSIS:  Hyponatremia [E87.1] Hypoglycemia [E16.2] Hypothermia, initial encounter [T68.XXXA] Sepsis, due to unspecified organism (HCC) [A41.9] Altered mental status, unspecified altered mental status type [R41.82]  DISCHARGE DIAGNOSIS:  Active Problems:   Hyponatremia   SECONDARY DIAGNOSIS:   Past Medical History:  Diagnosis Date  . Alcohol abuse   . Congestive heart failure (HCC)   . Depression   . Diabetes (HCC)   . Hx MRSA infection 04/15/2014  . Hyperlipidemia   . Hypertension   . Neuropathy   . Osteomyelitis of toe (HCC)    3rd toe    HOSPITAL COURSE:   62 year old male with past medical history of essential hypertension, hyperlipidemia, depression, neuropathy, diabetes, history of CHF who presented to the hospital he was found down and noted to be hypo-glycemic and hypothermic.  1.  Hypoglycemia- etiology unclear, no evidence of acute sepsis.  Patient's diabetic regimen has not been recently changed. -Patient's blood sugars improved with dextrose.  Diabetes coordinator consult obtained.  Patient's hemoglobin is A1c is 8.5.  Patient's Lantus insulin has been lowered prior to discharge from 33-25 units.  Patient was advised to check his blood sugars 3 times a day and report to his primary care physician. - Blood Sugars have been stable, and he is now clinically asymptomatic.  2.  Hypothermia-resolved.  Source unclear.  Patient was found down but no evidence of acute sepsis.  Patient was given empiric IV vancomycin, Zosyn x1 dose.   -No further hypothermia, this was probably related to underlying hypoglycemia and now has resolved.  3.  Alcohol abuse- he was maintained on  CIWA protocol but did not require any meds.  Clinically has no evidence of alcohol withdrawal presently.  4.  Diabetes type 2 without complication- while in the hospital patient was maintained on sliding scale insulin.  Patient's Lantus dose has been reduced due to the hypoglycemia as mentioned above.  5.  Diabetic neuropathy- he will continue gabapentin.  6.  Essential hypertension- he will  continue carvedilol, losartan.  7.  Depression- he will continue Celexa.  8.  Hyperlipidemia- he will continue atorvastatin.  9. BPH - he will cont. Flomax.     DISCHARGE CONDITIONS:   Stable  CONSULTS OBTAINED:    DRUG ALLERGIES:  No Known Allergies  DISCHARGE MEDICATIONS:   Allergies as of 02/10/2018   No Known Allergies     Medication List    STOP taking these medications   albuterol 108 (90 Base) MCG/ACT inhaler Commonly known as:  PROVENTIL HFA;VENTOLIN HFA     TAKE these medications   aspirin 81 MG chewable tablet Chew 81 mg by mouth daily. What changed:  Another medication with the same name was removed. Continue taking this medication, and follow the directions you see here.   atorvastatin 80 MG tablet Commonly known as:  LIPITOR Take 1 tablet (80 mg total) by mouth every evening.   carvedilol 6.25 MG tablet Commonly known as:  COREG Take 1 tablet (6.25 mg total) by mouth 2 (two) times daily.   citalopram 20 MG tablet Commonly known as:  CELEXA Take 1 tablet (20 mg total) by mouth daily.   furosemide 20 MG tablet Commonly known as:  LASIX Take 20 mg by mouth 2 (two) times daily.   gabapentin 300 MG capsule Commonly known as:  NEURONTIN Take 900 mg by mouth at bedtime.   insulin glargine 100 UNIT/ML injection Commonly known as:  LANTUS Inject 0.25 mLs (25 Units total) into the skin at bedtime. What changed:  how much to take   losartan 25 MG tablet Commonly known as:  COZAAR Take 25 mg by mouth daily.   tamsulosin 0.4 MG Caps capsule Commonly  known as:  FLOMAX Take 1 capsule (0.4 mg total) by mouth daily.         DISCHARGE INSTRUCTIONS:   DIET:  Cardiac diet and Diabetic diet  DISCHARGE CONDITION:  Stable  ACTIVITY:  Activity as tolerated  OXYGEN:  Home Oxygen: No.   Oxygen Delivery: room air  DISCHARGE LOCATION:  home   If you experience worsening of your admission symptoms, develop shortness of breath, life threatening emergency, suicidal or homicidal thoughts you must seek medical attention immediately by calling 911 or calling your MD immediately  if symptoms less severe.  You Must read complete instructions/literature along with all the possible adverse reactions/side effects for all the Medicines you take and that have been prescribed to you. Take any new Medicines after you have completely understood and accpet all the possible adverse reactions/side effects.   Please note  You were cared for by a hospitalist during your hospital stay. If you have any questions about your discharge medications or the care you received while you were in the hospital after you are discharged, you can call the unit and asked to speak with the hospitalist on call if the hospitalist that took care of you is not available. Once you are discharged, your primary care physician will handle any further medical issues. Please note that NO REFILLS for any discharge medications will be authorized once you are discharged, as it is imperative that you return to your primary care physician (or establish a relationship with a primary care physician if you do not have one) for your aftercare needs so that they can reassess your need for medications and monitor your lab values.     Today   No acute events overnight, no further hypothermia or hypoglycemia.  Patient's Lantus dose will be adjusted prior to discharge.  Patient did have positive blood cultures which are consistent with coagulase-negative staph which is likely a contaminant.   Patient has no fever and is hemodynamically stable.  VITAL SIGNS:  Blood pressure (!) 133/98, pulse 80, temperature 98.4 F (36.9 C), resp. rate 19, height 5\' 7"  (1.702 m), weight 80.6 kg (177 lb 11.1 oz), SpO2 97 %.  I/O:    Intake/Output Summary (Last 24 hours) at 02/10/2018 1443 Last data filed at 02/10/2018 1414 Gross per 24 hour  Intake 1200 ml  Output 1275 ml  Net -75 ml    PHYSICAL EXAMINATION:  GENERAL:  62 y.o.-year-old patient sitting up in chair in no acute distress.  EYES: Pupils equal, round, reactive to light and accommodation. No scleral icterus. Extraocular muscles intact.  HEENT: Head atraumatic, normocephalic. Oropharynx and nasopharynx clear.  NECK:  Supple, no jugular venous distention. No thyroid enlargement, no tenderness.  LUNGS: Normal breath sounds bilaterally, no wheezing, rales,rhonchi. No use of accessory muscles of respiration.  CARDIOVASCULAR: S1, S2 normal. No murmurs, rubs, or gallops.  ABDOMEN: Soft, non-tender, non-distended. Bowel sounds present. No organomegaly or mass.  EXTREMITIES: No pedal edema, cyanosis, or clubbing.  NEUROLOGIC: Cranial nerves II through XII  are intact. No focal motor or sensory defecits b/l.  PSYCHIATRIC: The patient is alert and oriented x 3.  SKIN: No obvious rash, lesion, or ulcer.   DATA REVIEW:   CBC Recent Labs  Lab 02/09/18 0458  WBC 3.7*  HGB 12.3*  HCT 35.1*  PLT 165    Chemistries  Recent Labs  Lab 02/08/18 1159  02/10/18 0537  NA 128*   < > 134*  K 5.1   < > 4.8  CL 97*   < > 104  CO2 19*   < > 27  GLUCOSE 379*   < > 180*  BUN 27*   < > 27*  CREATININE 1.38*   < > 1.62*  CALCIUM 9.1   < > 8.9  MG 1.9  --   --   AST 53*  --   --   ALT 49  --   --   ALKPHOS 96  --   --   BILITOT 1.0  --   --    < > = values in this interval not displayed.    Cardiac Enzymes Recent Labs  Lab 02/08/18 1159  TROPONINI <0.03    Microbiology Results  Results for orders placed or performed during the  hospital encounter of 02/08/18  MRSA PCR Screening     Status: None   Collection Time: 02/08/18 11:19 AM  Result Value Ref Range Status   MRSA by PCR NEGATIVE NEGATIVE Final    Comment:        The GeneXpert MRSA Assay (FDA approved for NASAL specimens only), is one component of a comprehensive MRSA colonization surveillance program. It is not intended to diagnose MRSA infection nor to guide or monitor treatment for MRSA infections. Performed at [facility], [address] [place], [place] [address]   Culture, blood (routine x 2)     Status: None (Preliminary result)   Collection Time: 02/08/18 11:59 AM  Result Value Ref Range Status   Specimen Description BLOOD LEFT HAND  Final   Special Requests BOTTLES DRAWN AEROBIC AND ANAEROBIC BCAV  Final   Culture   Final    NO GROWTH 2 DAYS Performed at [facility], [address], [place], [place] [address]    Report Status PENDING  Incomplete  Culture, blood (routine x 2)     Status: Abnormal (Preliminary result)   Collection Time: 02/08/18 12:00 PM  Result Value Ref Range Status   Specimen Description   Final    BLOOD RIGHT ARM Performed at [facility], [address], [place], [place] [address]    Special Requests   Final    BOTTLES DRAWN AEROBIC AND ANAEROBIC Blood Culture adequate volume   Culture  Setup Time   Final    IN BOTH AEROBIC AND ANAEROBIC BOTTLES GRAM POSITIVE COCCI IN CLUSTERS CRITICAL RESULT CALLED TO, READ BACK BY AND VERIFIED WITH: MICHAEL SIMPSON AT 1410 02/09/18 BY SNJ Performed at [facility], [address] [place], [place] [address]    Culture STAPHYLOCOCCUS SPECIES (COAGULASE NEGATIVE) (A)  Final   Report Status PENDING  Incomplete  Blood Culture ID Panel (Reflexed)     Status: Abnormal   Collection Time: 02/08/18 12:00 PM  Result Value Ref Range Status   Enterococcus species NOT DETECTED NOT DETECTED Final   Listeria monocytogenes NOT DETECTED NOT DETECTED  Final   Staphylococcus species DETECTED (A) NOT DETECTED Final    Comment: Methicillin (oxacillin) susceptible coagulase negative staphylococcus. Possible blood culture  contaminant (unless isolated from more than one blood culture draw or clinical case suggests pathogenicity). No antibiotic treatment is indicated for blood  culture contaminants. CRITICAL RESULT CALLED TO, READ BACK BY AND VERIFIED WITH: MICHAEL SIMPSON AT 0851 ON 02/09/18 BY SNJ    Staphylococcus aureus NOT DETECTED NOT DETECTED Final   Methicillin resistance NOT DETECTED NOT DETECTED Final   Streptococcus species NOT DETECTED NOT DETECTED Final   Streptococcus agalactiae NOT DETECTED NOT DETECTED Final   Streptococcus pneumoniae NOT DETECTED NOT DETECTED Final   Streptococcus pyogenes NOT DETECTED NOT DETECTED Final   Acinetobacter baumannii NOT DETECTED NOT DETECTED Final   Enterobacteriaceae species NOT DETECTED NOT DETECTED Final   Enterobacter cloacae complex NOT DETECTED NOT DETECTED Final   Escherichia coli NOT DETECTED NOT DETECTED Final   Klebsiella oxytoca NOT DETECTED NOT DETECTED Final   Klebsiella pneumoniae NOT DETECTED NOT DETECTED Final   Proteus species NOT DETECTED NOT DETECTED Final   Serratia marcescens NOT DETECTED NOT DETECTED Final   Haemophilus influenzae NOT DETECTED NOT DETECTED Final   Neisseria meningitidis NOT DETECTED NOT DETECTED Final   Pseudomonas aeruginosa NOT DETECTED NOT DETECTED Final   Candida albicans NOT DETECTED NOT DETECTED Final   Candida glabrata NOT DETECTED NOT DETECTED Final   Candida krusei NOT DETECTED NOT DETECTED Final   Candida parapsilosis NOT DETECTED NOT DETECTED Final   Candida tropicalis NOT DETECTED NOT DETECTED Final    Comment: Performed at [facility], [address] [place], [place] [address]    RADIOLOGY:  No results found.    Management plans discussed with the patient, family and they are in agreement.  CODE STATUS:     Code Status  Orders  (From admission, onward)        Start     Ordered   02/08/18 1600  Full code  Continuous     02/08/18 1559    TOTAL TIME TAKING CARE OF THIS PATIENT: 40 minutes.    Houston Siren M.D on 02/10/2018 at 2:43 PM  Between 7am to 6pm - Pager - [phone]  After 6pm go to [email or web address] - Social research officer, government  [facility]  Office  [phone]  CC: Primary care physician; Theodosia Blender, MD

## 2018-02-10 NOTE — Evaluation (Signed)
Physical Therapy Evaluation Patient Details Name: Maurice Jordan MRN: [id number] DOB: Sep 15, 1956 Today's Date: 02/10/2018   History of Present Illness  Pt admitted for hyponatremia and hypoglycemia. Pt found down on ground by roommate. Reports he does not recall events that occurred. HIstory includes alcohol abuse, CHF, Depression, DM, and HTN.  Clinical Impression  Pt is a pleasant 62 year old male who was admitted for hyponatremia and hypoglycemia. Pt demonstrates all bed mobility/transfers/ambulation at baseline level without AD. Pt reports he feels at baseline level after mobility tasks. Encouraged to continue to be active. Pt does not require any further PT needs at this time. Pt will be dc in house and does not require follow up. RN aware. Will dc current orders.      Follow Up Recommendations No PT follow up    Equipment Recommendations  None recommended by PT    Recommendations for Other Services       Precautions / Restrictions Restrictions Weight Bearing Restrictions: No      Mobility  Bed Mobility               General bed mobility comments: not performed as pt received in bathroom  Transfers Overall transfer level: Independent Equipment used: None             General transfer comment: safe technique performed without AD. Upright posture noted  Ambulation/Gait Ambulation/Gait assistance: Independent Ambulation Distance (Feet): 250 Feet Assistive device: None Gait Pattern/deviations: Step-through pattern     General Gait Details: slight staggering at first, however improves with increased distance. No AD used although pt uses SPC at baseline. Able to carry conversation during mobility without LOB  Stairs            Wheelchair Mobility    Modified Rankin (Stroke Patients Only)       Balance Overall balance assessment: History of Falls                                           Pertinent Vitals/Pain Pain Assessment:  No/denies pain    Home Living Family/patient expects to be discharged to:: Private residence Living Arrangements: Non-relatives/Friends(roommate) Available Help at Discharge: Family;Available 24 hours/day Type of Home: House Home Access: Stairs to enter Entrance Stairs-Rails: Can reach both Entrance Stairs-Number of Steps: 4 Home Layout: One level Home Equipment: Walker - 2 wheels;Cane - single point      Prior Function Level of Independence: Independent with assistive device(s)         Comments: used SPC in R hand      Hand Dominance        Extremity/Trunk Assessment   Upper Extremity Assessment Upper Extremity Assessment: Overall WFL for tasks assessed    Lower Extremity Assessment Lower Extremity Assessment: Overall WFL for tasks assessed       Communication   Communication: No difficulties  Cognition Arousal/Alertness: Awake/alert Behavior During Therapy: WFL for tasks assessed/performed Overall Cognitive Status: Within Functional Limits for tasks assessed                                        General Comments      Exercises Other Exercises Other Exercises: offerred to perform stair training, however declined at this time reporting he feels confident with all mobility tasks Other Exercises: received  in bathroom upon arrival, independent with all hygiene and washing hands   Assessment/Plan    PT Assessment Patent does not need any further PT services  PT Problem List         PT Treatment Interventions      PT Goals (Current goals can be found in the Care Plan section)  Acute Rehab PT Goals Patient Stated Goal: to go home PT Goal Formulation: All assessment and education complete, DC therapy Time For Goal Achievement: 02/10/18 Potential to Achieve Goals: Good    Frequency     Barriers to discharge        Co-evaluation               AM-PAC PT "6 Clicks" Daily Activity  Outcome Measure Difficulty turning over in  bed (including adjusting bedclothes, sheets and blankets)?: None Difficulty moving from lying on back to sitting on the side of the bed? : None Difficulty sitting down on and standing up from a chair with arms (e.g., wheelchair, bedside commode, etc,.)?: None Help needed moving to and from a bed to chair (including a wheelchair)?: None Help needed walking in hospital room?: None Help needed climbing 3-5 steps with a railing? : None 6 Click Score: 24    End of Session   Activity Tolerance: Patient tolerated treatment well Patient left: in chair Nurse Communication: Mobility status PT Visit Diagnosis: Unsteadiness on feet (R26.81)    Time: 6283-1517 PT Time Calculation (min) (ACUTE ONLY): 10 min   Charges:   PT Evaluation $PT Eval Low Complexity: 1 Low     PT G CodesElizabeth Palau, PT, DPT [phone]   Vitali Seibert 02/10/2018, 9:33 AM

## 2018-02-10 NOTE — Progress Notes (Signed)
Maurice Jordan  A and O x 4. VSS. Pt tolerating diet well. No complaints of pain or nausea. IV removed intact, no new prescriptions given. Pt voiced understanding of discharge instructions with no further questions. Pt discharged via wheelchair with nurse aide.  Orvil Feil MSN, RN-BC  Allergies as of 02/10/2018   No Known Allergies     Medication List    STOP taking these medications   albuterol 108 (90 Base) MCG/ACT inhaler Commonly known as:  PROVENTIL HFA;VENTOLIN HFA     TAKE these medications   aspirin 81 MG chewable tablet Chew 81 mg by mouth daily. What changed:  Another medication with the same name was removed. Continue taking this medication, and follow the directions you see here.   atorvastatin 80 MG tablet Commonly known as:  LIPITOR Take 1 tablet (80 mg total) by mouth every evening.   carvedilol 6.25 MG tablet Commonly known as:  COREG Take 1 tablet (6.25 mg total) by mouth 2 (two) times daily.   citalopram 20 MG tablet Commonly known as:  CELEXA Take 1 tablet (20 mg total) by mouth daily.   furosemide 20 MG tablet Commonly known as:  LASIX Take 20 mg by mouth 2 (two) times daily.   gabapentin 300 MG capsule Commonly known as:  NEURONTIN Take 900 mg by mouth at bedtime.   insulin glargine 100 UNIT/ML injection Commonly known as:  LANTUS Inject 0.25 mLs (25 Units total) into the skin at bedtime. What changed:  how much to take   losartan 25 MG tablet Commonly known as:  COZAAR Take 25 mg by mouth daily.   tamsulosin 0.4 MG Caps capsule Commonly known as:  FLOMAX Take 1 capsule (0.4 mg total) by mouth daily.       Vitals:   02/10/18 0548 02/10/18 1036  BP: (!) 148/92 (!) 133/98  Pulse: 80   Resp: 19   Temp: 98.4 F (36.9 C)   SpO2: 97%

## 2018-02-11 LAB — CULTURE, BLOOD (ROUTINE X 2): SPECIAL REQUESTS: ADEQUATE

## 2018-02-13 ENCOUNTER — Telehealth: Payer: Self-pay

## 2018-02-13 LAB — CULTURE, BLOOD (ROUTINE X 2): Culture: NO GROWTH

## 2018-02-13 NOTE — Telephone Encounter (Signed)
Flagged on EMMI report for not having transportation to follow up.  1st attempt to reach patient made 02/13/18 at 1:50pm, however unable to reach.  Left voicemail encouraging callback.  Will attempt at later time.

## 2018-02-19 NOTE — Telephone Encounter (Signed)
Reached upon 2nd attempt on 02/19/18 at 2:40pm.  Patient has transportation to appointments.  No further issues at this time.  I thanked him for his time.

## 2018-03-16 NOTE — Progress Notes (Deleted)
Patient ID: Maurice Jordan, male    DOB: 1955-11-24, 62 y.o.   MRN: [id number]  HPI  Mr Cacciola is a 62 y/o male with a history of depression, DM, hyperlipidemia, HTN, osteomyelitis, current alcohol use and chronic heart failure.   Echo report from 05/19/17 reviewed and shows an EF of 30% along with mild MR and normal pulmonary artery pressures.  Admitted 02/08/18 due to hypoglycemia and hypothermia. Treated and discharged after 2 days. Admitted 11/29/17 due to chest pain and influenza A. Cardiology consult. Chronically elevated troponin levels. Medications were adjusted and he was discharged the next day. Admitted 08/23/17 due to unresponsiveness possibly due to alcohol use. Discharged home after 2 days.   He presents today for a follow up visit with a chief complaint of     Past Medical History:  Diagnosis Date  . Alcohol abuse   . Congestive heart failure (HCC)   . Depression   . Diabetes (HCC)   . Hx MRSA infection 04/15/2014  . Hyperlipidemia   . Hypertension   . Neuropathy   . Osteomyelitis of toe (HCC)    3rd toe   Past Surgical History:  Procedure Laterality Date  . AMPUTATION OF REPLICATED TOES     Family History  Problem Relation Age of Onset  . Diabetes Mother   . Hypertension Mother    Social History   Tobacco Use  . Smoking status: Never Smoker  . Smokeless tobacco: Former Engineer, water Use Topics  . Alcohol use: Yes    Alcohol/week: 4.8 oz    Types: 8 Cans of beer per week   No Known Allergies    Review of Systems  Constitutional: Positive for fatigue (improving). Negative for appetite change and fever.  HENT: Negative for congestion, rhinorrhea and sore throat.   Eyes: Negative.   Respiratory: Positive for shortness of breath (when walking long distances). Negative for cough and chest tightness.   Cardiovascular: Negative for chest pain, palpitations and leg swelling.  Gastrointestinal: Positive for constipation. Negative for abdominal distention and  abdominal pain.  Endocrine: Negative.   Genitourinary: Negative.   Musculoskeletal: Positive for arthralgias (right lower rib cage). Negative for back pain and neck pain.  Skin: Negative.   Allergic/Immunologic: Negative.   Neurological: Positive for dizziness (at times when changing positions quickly). Negative for light-headedness.  Hematological: Negative for adenopathy. Does not bruise/bleed easily.  Psychiatric/Behavioral: Negative for dysphoric mood and sleep disturbance (sleeping on 2 pillows). The patient is not nervous/anxious.      Physical Exam  Constitutional: He is oriented to person, place, and time. He appears well-developed and well-nourished.  HENT:  Head: Normocephalic and atraumatic.  Neck: Normal range of motion. Neck supple. No JVD present.  Cardiovascular: Normal rate and regular rhythm.  Pulmonary/Chest: Effort normal and breath sounds normal.  Abdominal: Soft. He exhibits no distension. There is no tenderness.  Musculoskeletal: He exhibits no edema or tenderness.  Neurological: He is alert and oriented to person, place, and time.  Skin: Skin is warm and dry. Bruising (right lower rib cage) noted.  Psychiatric: He has a normal mood and affect. His behavior is normal. Thought content normal.  Nursing note and vitals reviewed.  Assessment & Plan:  1: Chronic heart failure with reduced ejection fraction-  - NYHA class II - euvolemic today - Weighing daily and reminded to call for an overnight weight gain of >2 pounds or a weekly weight gain of >5 pounds - weight down 3 pounds from 2 weeks ago;  hasn't been eating ice cream  - not adding salt - Reviewed fluid intake; No more than 40-50 ounces/day  - [facility]ology at [facility])  - BNP 02/13/15 was 414 - PharmD reconciled medications with the patient  2: HTN- - BP better today since starting losartan - taking furosemide 40mg  daily - entresto had been stopped due to previously low blood pressure - BMP  from 02/10/18 reviewed and showed sodium 134, potassium 4.8 and GFR 51  3: Diabetes- - nonfasting glucose at home this morning was  - he hasn't been eating ice cream - continues on Lantus qhs - saw PCP Rubye Oaks) 12/25/17  4: Alcohol use- - last alcohol use was 01/03/18 and he drank 7 twelve ounce cans of beer at a party that he went to - said that prior to going to the party, he also didn't take any of his medications so went the entire day without any of his medications - complete cessation discussed for 3 minutes with him  Medication bottles were reviewed.  Marland Kitchen

## 2018-03-18 ENCOUNTER — Ambulatory Visit: Payer: Medicaid Other | Admitting: Family

## 2018-03-23 ENCOUNTER — Ambulatory Visit: Payer: Medicaid Other | Admitting: Family

## 2018-03-24 ENCOUNTER — Encounter: Payer: Self-pay | Admitting: Family

## 2018-03-24 ENCOUNTER — Ambulatory Visit: Payer: Medicaid Other | Attending: Family | Admitting: Family

## 2018-03-24 VITALS — BP 138/96 | HR 89 | Resp 18 | Ht 66.0 in | Wt 170.0 lb

## 2018-03-24 DIAGNOSIS — Z794 Long term (current) use of insulin: Secondary | ICD-10-CM | POA: Diagnosis not present

## 2018-03-24 DIAGNOSIS — M869 Osteomyelitis, unspecified: Secondary | ICD-10-CM | POA: Insufficient documentation

## 2018-03-24 DIAGNOSIS — Z8249 Family history of ischemic heart disease and other diseases of the circulatory system: Secondary | ICD-10-CM | POA: Diagnosis not present

## 2018-03-24 DIAGNOSIS — I5022 Chronic systolic (congestive) heart failure: Secondary | ICD-10-CM

## 2018-03-24 DIAGNOSIS — Z09 Encounter for follow-up examination after completed treatment for conditions other than malignant neoplasm: Secondary | ICD-10-CM | POA: Insufficient documentation

## 2018-03-24 DIAGNOSIS — E1140 Type 2 diabetes mellitus with diabetic neuropathy, unspecified: Secondary | ICD-10-CM | POA: Insufficient documentation

## 2018-03-24 DIAGNOSIS — E785 Hyperlipidemia, unspecified: Secondary | ICD-10-CM | POA: Insufficient documentation

## 2018-03-24 DIAGNOSIS — E11649 Type 2 diabetes mellitus with hypoglycemia without coma: Secondary | ICD-10-CM | POA: Insufficient documentation

## 2018-03-24 DIAGNOSIS — E875 Hyperkalemia: Secondary | ICD-10-CM | POA: Diagnosis not present

## 2018-03-24 DIAGNOSIS — I10 Essential (primary) hypertension: Secondary | ICD-10-CM

## 2018-03-24 DIAGNOSIS — Z833 Family history of diabetes mellitus: Secondary | ICD-10-CM | POA: Diagnosis not present

## 2018-03-24 DIAGNOSIS — Z79899 Other long term (current) drug therapy: Secondary | ICD-10-CM | POA: Insufficient documentation

## 2018-03-24 DIAGNOSIS — Z89429 Acquired absence of other toe(s), unspecified side: Secondary | ICD-10-CM | POA: Diagnosis not present

## 2018-03-24 DIAGNOSIS — E1169 Type 2 diabetes mellitus with other specified complication: Secondary | ICD-10-CM | POA: Insufficient documentation

## 2018-03-24 DIAGNOSIS — Z7982 Long term (current) use of aspirin: Secondary | ICD-10-CM | POA: Insufficient documentation

## 2018-03-24 DIAGNOSIS — E1022 Type 1 diabetes mellitus with diabetic chronic kidney disease: Secondary | ICD-10-CM

## 2018-03-24 DIAGNOSIS — I509 Heart failure, unspecified: Secondary | ICD-10-CM | POA: Insufficient documentation

## 2018-03-24 DIAGNOSIS — F329 Major depressive disorder, single episode, unspecified: Secondary | ICD-10-CM | POA: Diagnosis not present

## 2018-03-24 DIAGNOSIS — N183 Chronic kidney disease, stage 3 (moderate): Secondary | ICD-10-CM

## 2018-03-24 DIAGNOSIS — Z8614 Personal history of Methicillin resistant Staphylococcus aureus infection: Secondary | ICD-10-CM | POA: Diagnosis not present

## 2018-03-24 DIAGNOSIS — I110 Hypertensive heart disease with heart failure: Secondary | ICD-10-CM | POA: Insufficient documentation

## 2018-03-24 LAB — BASIC METABOLIC PANEL
ANION GAP: 9 (ref 5–15)
BUN: 23 mg/dL — AB (ref 6–20)
CALCIUM: 9.4 mg/dL (ref 8.9–10.3)
CO2: 25 mmol/L (ref 22–32)
Chloride: 101 mmol/L (ref 101–111)
Creatinine, Ser: 1.91 mg/dL — ABNORMAL HIGH (ref 0.61–1.24)
GFR calc Af Amer: 41 mL/min — ABNORMAL LOW (ref >60)
GFR calc non Af Amer: 36 mL/min — ABNORMAL LOW (ref >60)
Glucose, Bld: 231 mg/dL — ABNORMAL HIGH (ref 65–99)
Potassium: 4.1 mmol/L (ref 3.5–5.1)
Sodium: 135 mmol/L (ref 135–145)

## 2018-03-24 MED ORDER — CARVEDILOL 12.5 MG PO TABS: 12.5000 mg | ORAL_TABLET | Freq: Two times a day (BID) | ORAL | 5 refills | Status: DC

## 2018-03-24 NOTE — Progress Notes (Signed)
Patient ID: Maurice Jordan, male    DOB: 11-13-1955, 62 y.o.   MRN: [id number]  HPI  Maurice Jordan is a 62 y/o male with a history of depression, DM, hyperlipidemia, HTN, osteomyelitis, current alcohol use and chronic heart failure.   Echo report from 05/19/17 reviewed and shows an EF of 30% along with mild Maurice and normal pulmonary artery pressures.  Admitted 02/08/18 due to hypoglycemia and hypothermia. Treated and discharged after 2 days. Admitted 11/29/17 due to chest pain and influenza A. Cardiology consult. Chronically elevated troponin levels. Medications were adjusted and he was discharged the next day. Admitted 08/23/17 due to unresponsiveness possibly due to alcohol use. Discharged home after 2 days.   He presents today for a follow up visit with a chief complaint of minimal fatigue upon moderate exertion. He describes this as chronic in nature having been present for several years. He has no associated symptoms. He denies any difficulty sleeping, abdominal distention, palpitations, pedal edema, chest pain, shortness of breath, cough, dizziness or weight gain. His losartan has been decreased due to hyperkalemia.  Past Medical History:  Diagnosis Date  . Alcohol abuse   . Congestive heart failure (HCC)   . Depression   . Diabetes (HCC)   . Hx MRSA infection 04/15/2014  . Hyperlipidemia   . Hypertension   . Neuropathy   . Osteomyelitis of toe (HCC)    3rd toe   Past Surgical History:  Procedure Laterality Date  . AMPUTATION OF REPLICATED TOES     Family History  Problem Relation Age of Onset  . Diabetes Mother   . Hypertension Mother    Social History   Tobacco Use  . Smoking status: Never Smoker  . Smokeless tobacco: Former Engineer, water Use Topics  . Alcohol use: Yes    Alcohol/week: 4.8 oz    Types: 8 Cans of beer per week   No Known Allergies  Prior to Admission medications   Medication Sig Start Date End Date Taking? Authorizing Provider  aspirin 81 MG chewable  tablet Chew 81 mg by mouth daily.   Yes [provider]  atorvastatin (LIPITOR) 80 MG tablet Take 1 tablet (80 mg total) by mouth every evening. 05/20/17  Yes Maurice Baas, MD  citalopram (CELEXA) 20 MG tablet Take 1 tablet (20 mg total) by mouth daily. 05/20/17  Yes Maurice Baas, MD  docusate sodium (COLACE) 100 MG capsule Take 200 mg by mouth 2 (two) times daily.   Yes [provider]  furosemide (LASIX) 20 MG tablet Take 20 mg by mouth 2 (two) times daily.    Yes [provider]  gabapentin (NEURONTIN) 300 MG capsule Take 900 mg by mouth at bedtime.   Yes [provider]  insulin glargine (LANTUS) 100 UNIT/ML injection Inject 0.25 mLs (25 Units total) into the skin at bedtime. Patient taking differently: Inject 27 Units into the skin at bedtime.  02/10/18  Yes Maurice Jordan, Maurice Pancake, MD  losartan (COZAAR) 25 MG tablet Take 12.5 mg by mouth daily.    Yes [provider]  tamsulosin (FLOMAX) 0.4 MG CAPS capsule Take 1 capsule (0.4 mg total) by mouth daily. 05/20/17  Yes Maurice Baas, MD  carvedilol (COREG) 12.5 MG tablet Take 1 tablet (12.5 mg total) by mouth 2 (two) times daily. 03/24/18 06/22/18  Maurice Jordan    Review of Systems  Constitutional: Positive for fatigue (improving). Negative for appetite change and fever.  HENT: Negative for congestion, rhinorrhea and sore throat.  Eyes: Negative.   Respiratory: Negative for cough, chest tightness and shortness of breath.   Cardiovascular: Negative for chest pain, palpitations and leg swelling.  Gastrointestinal: Negative for abdominal distention and abdominal pain.  Endocrine: Negative.   Genitourinary: Negative.   Musculoskeletal: Negative for back pain and neck pain.  Skin: Negative.   Allergic/Immunologic: Negative.   Neurological: Negative for dizziness and light-headedness.  Hematological: Negative for adenopathy. Does not bruise/bleed easily.  Psychiatric/Behavioral: Negative  for dysphoric mood and sleep disturbance (sleeping on 2 pillows). The patient is not nervous/anxious.    Vitals:   03/24/18 0952  BP: (!) 138/96  Pulse: 89  Resp: 18  SpO2: 100%  Weight: 170 lb (77.1 kg)  Height: 5\' 6"  (1.676 m)   Wt Readings from Last 3 Encounters:  03/24/18 170 lb (77.1 kg)  02/08/18 177 lb 11.1 oz (80.6 kg)  01/14/18 173 lb 6 oz (78.6 kg)   Lab Results  Component Value Date   CREATININE 1.62 (H) 02/10/2018   CREATININE 1.99 (H) 02/09/2018   CREATININE 1.38 (H) 02/08/2018    Physical Exam  Constitutional: He is oriented to person, place, and time. He appears well-developed and well-nourished.  HENT:  Head: Normocephalic and atraumatic.  Neck: Normal range of motion. Neck supple. No JVD present.  Cardiovascular: Normal rate and regular rhythm.  Pulmonary/Chest: Effort normal and breath sounds normal.  Abdominal: Soft. He exhibits no distension. There is no tenderness.  Musculoskeletal: He exhibits no edema or tenderness.  Neurological: He is alert and oriented to person, place, and time.  Skin: Skin is warm and dry.  Psychiatric: He has a normal mood and affect. His behavior is normal. Thought content normal.  Nursing note and vitals reviewed.  Assessment & Plan:  1: Chronic heart failure with reduced ejection fraction-  - NYHA class II - euvolemic today - Weighing daily and reminded to call for an overnight weight gain of >2 pounds or a weekly weight gain of >5 pounds - weight down 3.6 pounds from the last time he was here on 01/14/18  - not adding salt - losartan had been decreased by PCP due to hyperkalemia; will get a BMP today - will increase his carvedilol to 12.5mg  BID - Reviewed fluid intake; No more than 40-50 ounces/day  - BNP 02/13/15 was 414 - PharmD reconciled medications with the patient  2: HTN- - BP mildly elevated today; increasing carvedilol per above - taking furosemide 40mg  daily - BMP from 02/10/18 reviewed and showed sodium  134, potassium 4.8 and GFR 51  3: Diabetes- - nonfasting glucose at home this morning was 182 - continues on Lantus qhs - saw PCP Maurice Jordan) 03/12/18 - PCP made nephrology referral  Medication bottles were reviewed.  Return in 1 month or sooner for any questions/problems before then.   Marland Kitchen

## 2018-03-24 NOTE — Patient Instructions (Addendum)
Continue weighing daily and call for an overnight weight gain of > 2 pounds or a weekly weight gain of >5 pounds.  Finish your current bottle of carvedilol by taking 2 tablets twice daily until they are gone. Then you will pick up the new dosage and then resume taking it as 1 tablet twice daily

## 2018-04-21 ENCOUNTER — Ambulatory Visit: Payer: Medicaid Other | Admitting: Family

## 2018-05-03 IMAGING — US US CAROTID DUPLEX BILAT
1 series · 13 of 24 positions shown · non-contrast
Comparison: None.

CLINICAL DATA: Syncope

EXAM:
BILATERAL CAROTID DUPLEX ULTRASOUND
TECHNIQUE: Gray scale imaging, color Doppler and duplex ultrasound were
performed of bilateral carotid and vertebral arteries in the neck.

[Series 1: us carotid duplex bilat · 13 of 62 slices shown]
[im 1/62]
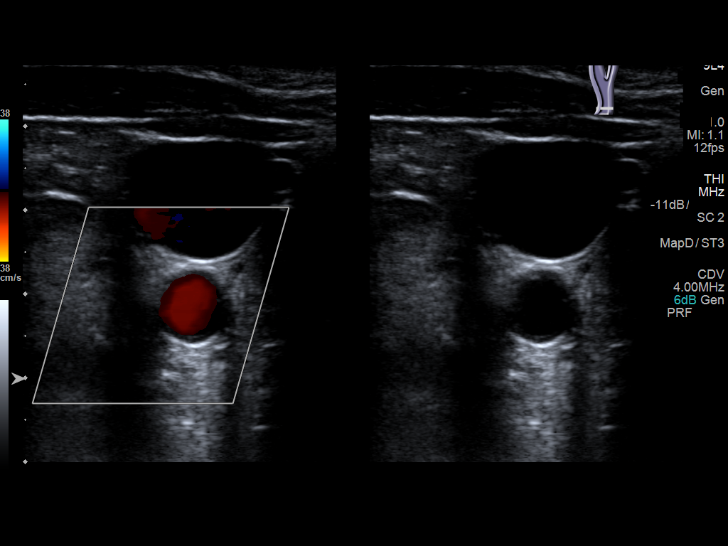
[im 6/62]
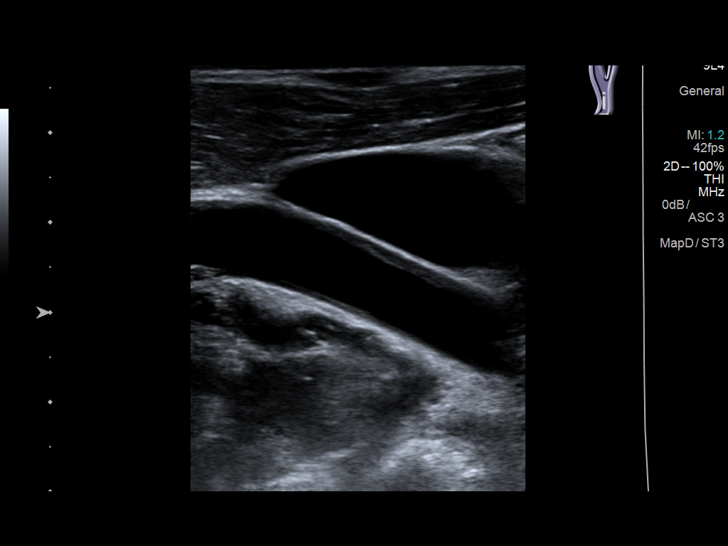
[im 11/62]
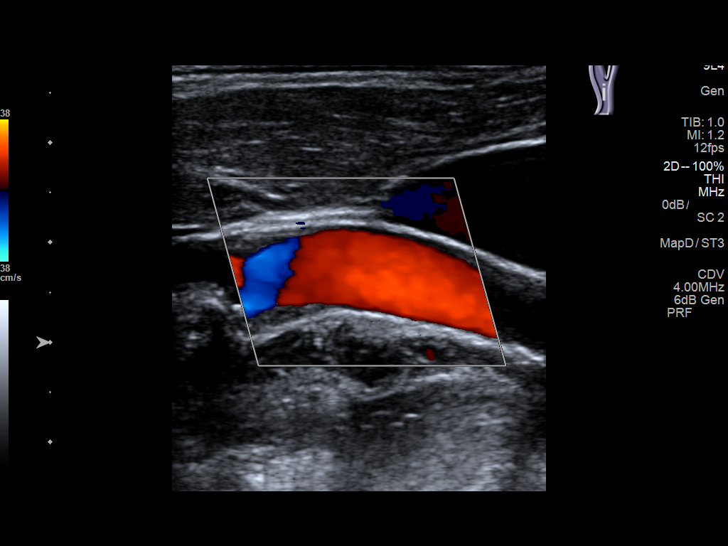
[im 16/62]
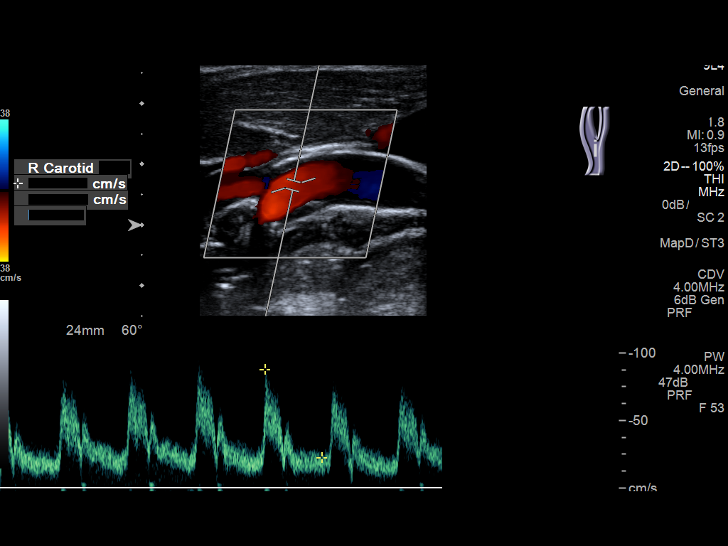
[im 22/62]
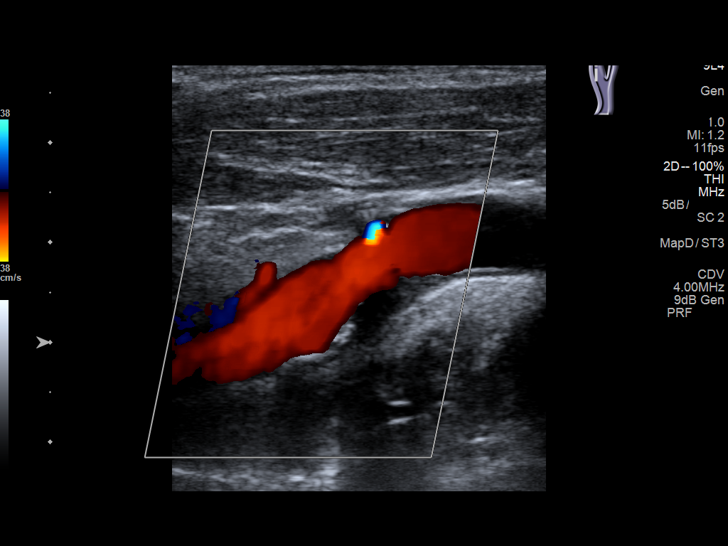
[im 27/62]
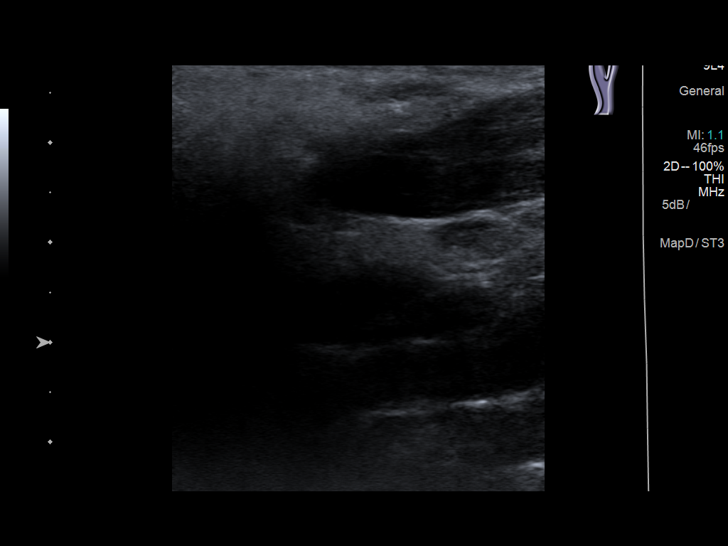
[im 32/62]
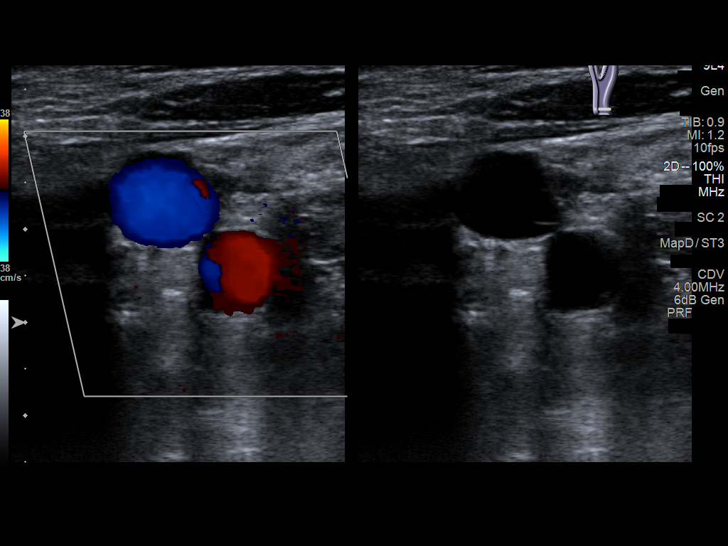
[im 35/62]
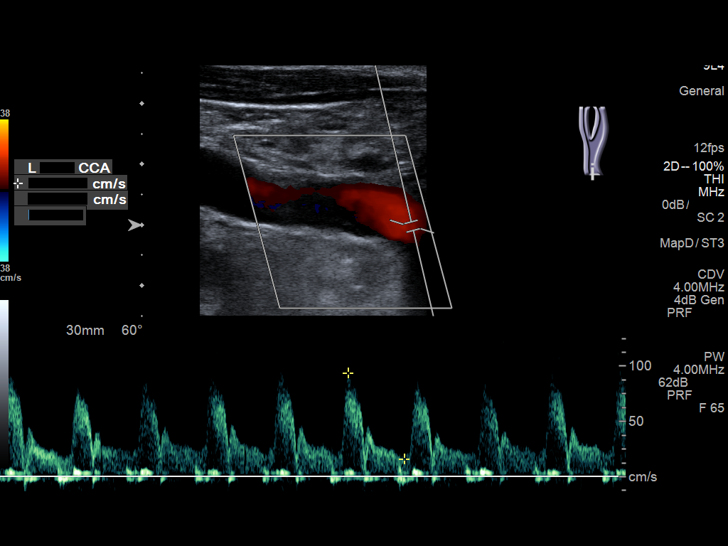
[im 40/62]
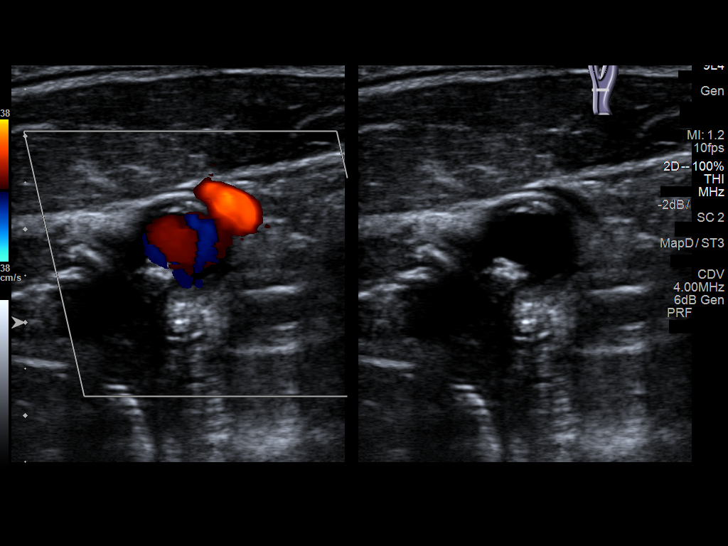
[im 46/62]
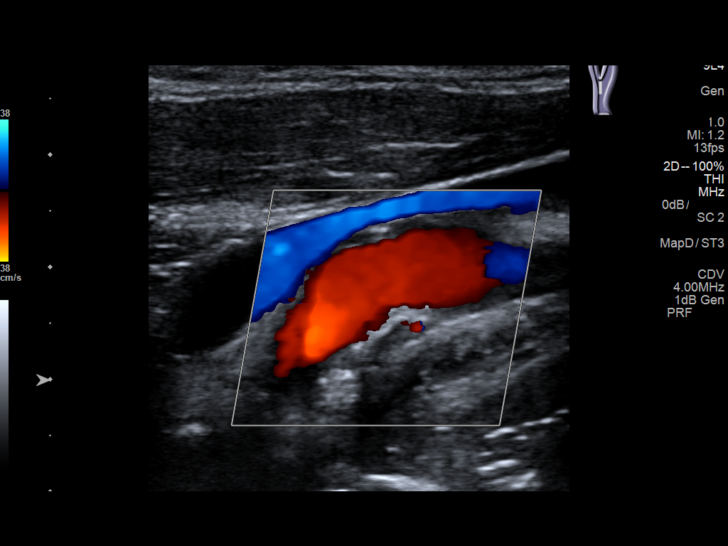
[im 51/62]
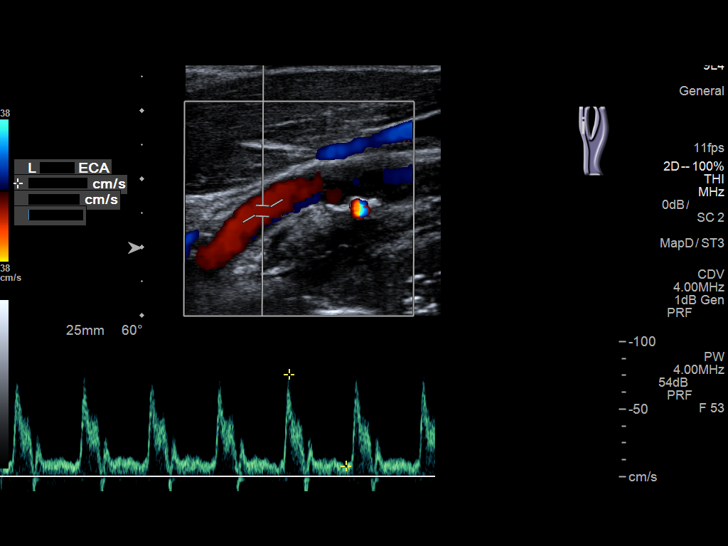
[im 56/62]
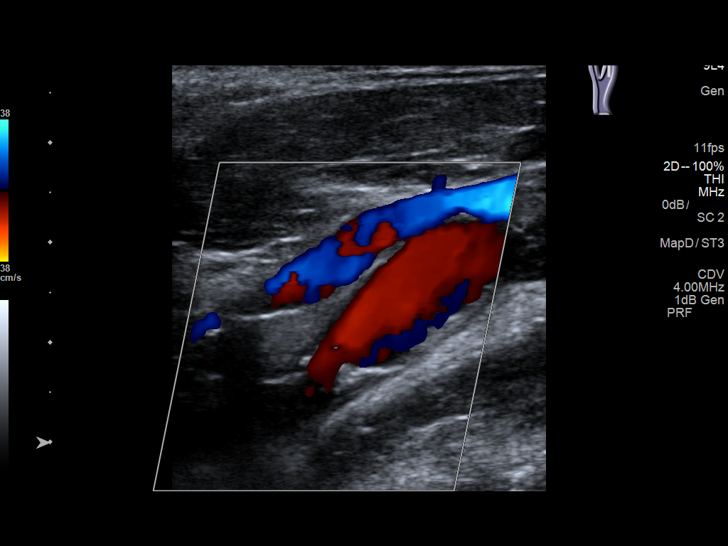
[im 62/62]
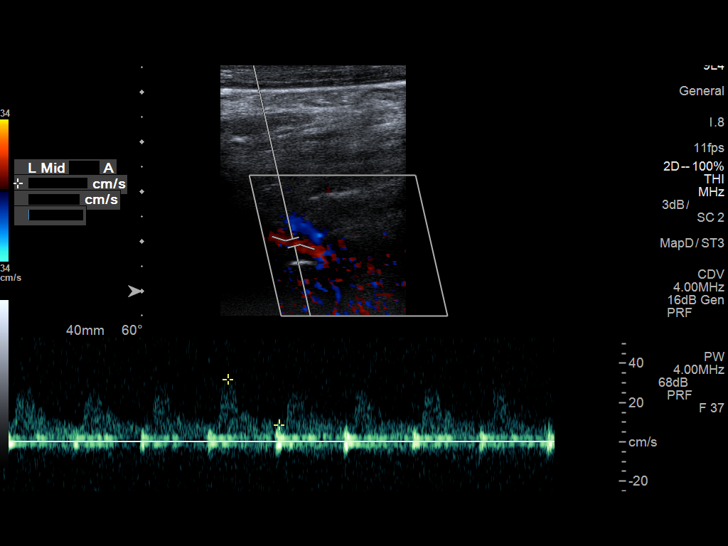

[13 of 24 positions shown; findings below may reference images not displayed]

FINDINGS: Criteria: Quantification of carotid stenosis is based on velocity
parameters that correlate the residual internal carotid diameter
with NASCET-based stenosis levels, using the diameter of the distal
internal carotid lumen as the denominator for stenosis measurement.

The following velocity measurements were obtained:

RIGHT

ICA:  121 cm/sec

CCA:  63 cm/sec

SYSTOLIC ICA/CCA RATIO:

DIASTOLIC ICA/CCA RATIO:

ECA:  115 cm/sec

LEFT

ICA:  65 cm/sec

CCA:  54 cm/sec

SYSTOLIC ICA/CCA RATIO:

DIASTOLIC ICA/CCA RATIO:

ECA:  76 cm/sec

RIGHT CAROTID ARTERY: Moderate irregular mixed plaque in the bulb.
Low resistance internal carotid Doppler pattern.

RIGHT VERTEBRAL ARTERY:  Antegrade.

LEFT CAROTID ARTERY: Mild calcified plaque in the bulb. Low
resistance internal carotid Doppler pattern. There is prominent
irregular calcified plaque in the mid internal carotid artery.

LEFT VERTEBRAL ARTERY:  Antegrade.
IMPRESSION: There is less than 50% stenosis in the right and left internal
carotid arteries. Calcified plaque in both bulbs is noted.

## 2018-05-07 ENCOUNTER — Encounter: Payer: Self-pay | Admitting: Family

## 2018-05-07 ENCOUNTER — Ambulatory Visit: Payer: Medicaid Other | Attending: Family | Admitting: Family

## 2018-05-07 VITALS — BP 108/79 | HR 73 | Resp 18 | Ht 66.0 in | Wt 171.2 lb

## 2018-05-07 DIAGNOSIS — Z09 Encounter for follow-up examination after completed treatment for conditions other than malignant neoplasm: Secondary | ICD-10-CM | POA: Insufficient documentation

## 2018-05-07 DIAGNOSIS — F329 Major depressive disorder, single episode, unspecified: Secondary | ICD-10-CM | POA: Insufficient documentation

## 2018-05-07 DIAGNOSIS — E1022 Type 1 diabetes mellitus with diabetic chronic kidney disease: Secondary | ICD-10-CM

## 2018-05-07 DIAGNOSIS — Z794 Long term (current) use of insulin: Secondary | ICD-10-CM | POA: Insufficient documentation

## 2018-05-07 DIAGNOSIS — E1169 Type 2 diabetes mellitus with other specified complication: Secondary | ICD-10-CM | POA: Diagnosis not present

## 2018-05-07 DIAGNOSIS — Z79899 Other long term (current) drug therapy: Secondary | ICD-10-CM | POA: Insufficient documentation

## 2018-05-07 DIAGNOSIS — I5022 Chronic systolic (congestive) heart failure: Secondary | ICD-10-CM | POA: Insufficient documentation

## 2018-05-07 DIAGNOSIS — Z7982 Long term (current) use of aspirin: Secondary | ICD-10-CM | POA: Insufficient documentation

## 2018-05-07 DIAGNOSIS — Z8614 Personal history of Methicillin resistant Staphylococcus aureus infection: Secondary | ICD-10-CM | POA: Insufficient documentation

## 2018-05-07 DIAGNOSIS — E1140 Type 2 diabetes mellitus with diabetic neuropathy, unspecified: Secondary | ICD-10-CM | POA: Insufficient documentation

## 2018-05-07 DIAGNOSIS — N183 Chronic kidney disease, stage 3 unspecified: Secondary | ICD-10-CM

## 2018-05-07 DIAGNOSIS — Z8249 Family history of ischemic heart disease and other diseases of the circulatory system: Secondary | ICD-10-CM | POA: Insufficient documentation

## 2018-05-07 DIAGNOSIS — Z9889 Other specified postprocedural states: Secondary | ICD-10-CM | POA: Diagnosis not present

## 2018-05-07 DIAGNOSIS — E785 Hyperlipidemia, unspecified: Secondary | ICD-10-CM | POA: Insufficient documentation

## 2018-05-07 DIAGNOSIS — I110 Hypertensive heart disease with heart failure: Secondary | ICD-10-CM | POA: Diagnosis not present

## 2018-05-07 DIAGNOSIS — I10 Essential (primary) hypertension: Secondary | ICD-10-CM

## 2018-05-07 DIAGNOSIS — E11649 Type 2 diabetes mellitus with hypoglycemia without coma: Secondary | ICD-10-CM | POA: Insufficient documentation

## 2018-05-07 NOTE — Patient Instructions (Signed)
Continue weighing daily and call for an overnight weight gain of > 2 pounds or a weekly weight gain of >5 pounds. 

## 2018-05-07 NOTE — Progress Notes (Deleted)
Patient ID: Maurice Jordan, male    DOB: 1956/02/09, 62 y.o.   MRN: [id number]  HPI  Maurice Jordan is a 62 y/o male with a history of depression, DM, hyperlipidemia, HTN, osteomyelitis, current alcohol use and chronic heart failure.   Echo report from 05/19/17 reviewed and shows an EF of 30% along with mild Maurice and normal pulmonary artery pressures.  Admitted 02/08/18 due to hypoglycemia and hypothermia. Treated and discharged after 2 days. Admitted 11/29/17 due to chest pain and influenza A. Cardiology consult. Chronically elevated troponin levels. Medications were adjusted and he was discharged the next day. Admitted 08/23/17 due to unresponsiveness possibly due to alcohol use. Discharged home after 2 days.   He presents today for a follow up visit with a chief complaint of minimal fatigue upon moderate exertion. He describes this as chronic in nature having been present for several years. He has no associated symptoms. He denies any difficulty sleeping, abdominal distention, palpitations, pedal edema, chest pain, shortness of breath, cough, dizziness or weight gain. His losartan has been decreased due to hyperkalemia.  Past Medical History:  Diagnosis Date  . Alcohol abuse   . Congestive heart failure (HCC)   . Depression   . Diabetes (HCC)   . Hx MRSA infection 04/15/2014  . Hyperlipidemia   . Hypertension   . Neuropathy   . Osteomyelitis of toe (HCC)    3rd toe   Past Surgical History:  Procedure Laterality Date  . AMPUTATION OF REPLICATED TOES     Family History  Problem Relation Age of Onset  . Diabetes Mother   . Hypertension Mother    Social History   Tobacco Use  . Smoking status: Never Smoker  . Smokeless tobacco: Former Engineer, water Use Topics  . Alcohol use: Yes    Alcohol/week: 4.8 oz    Types: 8 Cans of beer per week   No Known Allergies    Review of Systems  Constitutional: Positive for fatigue (improving). Negative for appetite change and fever.  HENT:  Negative for congestion, rhinorrhea and sore throat.   Eyes: Negative.   Respiratory: Negative for cough, chest tightness and shortness of breath.   Cardiovascular: Negative for chest pain, palpitations and leg swelling.  Gastrointestinal: Negative for abdominal distention and abdominal pain.  Endocrine: Negative.   Genitourinary: Negative.   Musculoskeletal: Negative for back pain and neck pain.  Skin: Negative.   Allergic/Immunologic: Negative.   Neurological: Negative for dizziness and light-headedness.  Hematological: Negative for adenopathy. Does not bruise/bleed easily.  Psychiatric/Behavioral: Negative for dysphoric mood and sleep disturbance (sleeping on 2 pillows). The patient is not nervous/anxious.      Physical Exam  Constitutional: He is oriented to person, place, and time. He appears well-developed and well-nourished.  HENT:  Head: Normocephalic and atraumatic.  Neck: Normal range of motion. Neck supple. No JVD present.  Cardiovascular: Normal rate and regular rhythm.  Pulmonary/Chest: Effort normal and breath sounds normal.  Abdominal: Soft. He exhibits no distension. There is no tenderness.  Musculoskeletal: He exhibits no edema or tenderness.  Neurological: He is alert and oriented to person, place, and time.  Skin: Skin is warm and dry.  Psychiatric: He has a normal mood and affect. His behavior is normal. Thought content normal.  Nursing note and vitals reviewed.  Assessment & Plan:  1: Chronic heart failure with reduced ejection fraction-  - NYHA class II - euvolemic today - Weighing daily and reminded to call for an overnight weight gain of >  2 pounds or a weekly weight gain of >5 pounds - weight down 3.6 pounds from the last time he was here on 01/14/18  - not adding salt - losartan had been decreased by PCP due to hyperkalemia; will get a BMP today - will increase his carvedilol to 12.5mg  BID - Reviewed fluid intake; No more than 40-50 ounces/day  - BNP  02/13/15 was 414 - PharmD reconciled medications with the patient  2: HTN- - BP mildly elevated today; increasing carvedilol per above - taking furosemide 40mg  daily - BMP from 02/10/18 reviewed and showed sodium 134, potassium 4.8 and GFR 51  3: Diabetes- - nonfasting glucose at home this morning was 182 - continues on Lantus qhs - saw PCP Maurice Jordan) 03/12/18 - PCP made nephrology referral  Medication bottles were reviewed.  Return in 1 month or sooner for any questions/problems before then.   Marland Kitchen

## 2018-05-07 NOTE — Progress Notes (Signed)
Patient ID: Maurice Jordan, male    DOB: April 13, 1956, 62 y.o.   MRN: [id number]  HPI  Maurice Jordan is a 62 y/o male with a history of depression, DM, hyperlipidemia, HTN, osteomyelitis, current alcohol use and chronic heart failure.   Echo report from 05/19/17 reviewed and shows an EF of 30% along with mild Maurice and normal pulmonary artery pressures.  Admitted 02/08/18 due to hypoglycemia and hypothermia. Treated and discharged after 2 days. Admitted 11/29/17 due to chest pain and influenza A. Cardiology consult. Chronically elevated troponin levels. Medications were adjusted and he was discharged the next day.   He presents today for a follow up visit with a chief complaint of minimal fatigue upon moderate exertion. He says this has been present for several years. He has associated light-headedness along with this. He denies any difficulty sleeping, abdominal distention, palpitations, pedal edema, chest pain, cough, shortness of breath or weight gain.   Past Medical History:  Diagnosis Date  . Alcohol abuse   . Congestive heart failure (HCC)   . Depression   . Diabetes (HCC)   . Hx MRSA infection 04/15/2014  . Hyperlipidemia   . Hypertension   . Neuropathy   . Osteomyelitis of toe (HCC)    3rd toe   Past Surgical History:  Procedure Laterality Date  . AMPUTATION OF REPLICATED TOES     Family History  Problem Relation Age of Onset  . Diabetes Mother   . Hypertension Mother    Social History   Tobacco Use  . Smoking status: Never Smoker  . Smokeless tobacco: Former Engineer, water Use Topics  . Alcohol use: Yes    Alcohol/week: 4.8 oz    Types: 8 Cans of beer per week   No Known Allergies  Prior to Admission medications   Medication Sig Start Date End Date Taking? Authorizing Provider  aspirin 81 MG chewable tablet Chew 81 mg by mouth daily.   Yes [provider]  atorvastatin (LIPITOR) 80 MG tablet Take 1 tablet (80 mg total) by mouth every evening. 05/20/17  Yes  Enid Baas, MD  citalopram (CELEXA) 20 MG tablet Take 1 tablet (20 mg total) by mouth daily. 05/20/17  Yes Enid Baas, MD  docusate sodium (COLACE) 100 MG capsule Take 200 mg by mouth 2 (two) times daily.   Yes [provider]  furosemide (LASIX) 20 MG tablet Take 20 mg by mouth 2 (two) times daily.    Yes [provider]  gabapentin (NEURONTIN) 300 MG capsule Take 900 mg by mouth at bedtime.   Yes [provider]  insulin glargine (LANTUS) 100 UNIT/ML injection Inject 0.25 mLs (25 Units total) into the skin at bedtime. Patient taking differently: Inject 27 Units into the skin at bedtime.  02/10/18  Yes Sainani, Rolly Pancake, MD  losartan (COZAAR) 25 MG tablet Take 12.5 mg by mouth daily.    Yes [provider]  tamsulosin (FLOMAX) 0.4 MG CAPS capsule Take 1 capsule (0.4 mg total) by mouth daily. 05/20/17  Yes Enid Baas, MD  carvedilol (COREG) 12.5 MG tablet Take 1 tablet (12.5 mg total) by mouth 2 (two) times daily. 03/24/18 06/22/18  Delma Freeze, FNP    Review of Systems  Constitutional: Positive for fatigue (improving). Negative for appetite change and fever.  HENT: Negative for congestion, rhinorrhea and sore throat.   Eyes: Negative.   Respiratory: Negative for cough, chest tightness and shortness of breath.   Cardiovascular: Negative for chest pain, palpitations and leg  swelling.  Gastrointestinal: Negative for abdominal distention and abdominal pain.  Endocrine: Negative.   Genitourinary: Negative.   Musculoskeletal: Negative for back pain and neck pain.  Skin: Negative.   Allergic/Immunologic: Negative.   Neurological: Positive for light-headedness (at times). Negative for dizziness.  Hematological: Negative for adenopathy. Does not bruise/bleed easily.  Psychiatric/Behavioral: Negative for dysphoric mood and sleep disturbance (sleeping on 2 pillows). The patient is not nervous/anxious.    Vitals:   05/07/18 1353  BP: 108/79   Pulse: 73  Resp: 18  SpO2: 99%  Weight: 171 lb 4 oz (77.7 kg)  Height: 5\' 6"  (1.676 m)   Wt Readings from Last 3 Encounters:  05/07/18 171 lb 4 oz (77.7 kg)  03/24/18 170 lb (77.1 kg)  02/08/18 177 lb 11.1 oz (80.6 kg)   Lab Results  Component Value Date   CREATININE 1.91 (H) 03/24/2018   CREATININE 1.62 (H) 02/10/2018   CREATININE 1.99 (H) 02/09/2018    Physical Exam  Constitutional: He is oriented to person, place, and time. He appears well-developed and well-nourished.  HENT:  Head: Normocephalic and atraumatic.  Neck: Normal range of motion. Neck supple. No JVD present.  Cardiovascular: Normal rate and regular rhythm.  Pulmonary/Chest: Effort normal and breath sounds normal.  Abdominal: Soft. He exhibits no distension. There is no tenderness.  Musculoskeletal: He exhibits no edema or tenderness.  Neurological: He is alert and oriented to person, place, and time.  Skin: Skin is warm and dry.  Psychiatric: He has a normal mood and affect. His behavior is normal. Thought content normal.  Nursing note and vitals reviewed.  Assessment & Plan:  1: Chronic heart failure with reduced ejection fraction-  - NYHA class II - euvolemic today - Weighing daily and reminded to call for an overnight weight gain of >2 pounds or a weekly weight gain of >5 pounds - weight stable from last time he was here - not adding salt - losartan had been decreased by PCP due to hyperkalemia;  - Reviewed fluid intake; No more than 40-50 ounces/day  - BNP 02/13/15 was 414  2: HTN- - BP looks good today - taking furosemide 40mg  daily - BMP from 03/24/18 reviewed and showed sodium 135, potassium 4.1 and GFR 41  3: Diabetes- - fasting glucose at home this morning was 69 - continues on Lantus qhs - saw PCP Rubye Oaks) 03/12/18 - PCP made nephrology referral  Medication bottles were reviewed.  Return in 3 months or sooner for any questions/problems before then.   Marland Kitchen

## 2018-05-12 ENCOUNTER — Ambulatory Visit: Payer: Medicaid Other | Admitting: Family

## 2018-06-22 ENCOUNTER — Emergency Department
Admission: EM | Admit: 2018-06-22 | Discharge: 2018-06-22 | Disposition: A | Discharge status: Home / Self Care | Payer: Medicaid Other | Attending: Emergency Medicine | Admitting: Emergency Medicine

## 2018-06-22 ENCOUNTER — Emergency Department: Payer: Medicaid Other

## 2018-06-22 ENCOUNTER — Other Ambulatory Visit: Payer: Self-pay

## 2018-06-22 DIAGNOSIS — R55 Syncope and collapse: Secondary | ICD-10-CM

## 2018-06-22 DIAGNOSIS — I5022 Chronic systolic (congestive) heart failure: Secondary | ICD-10-CM | POA: Diagnosis not present

## 2018-06-22 DIAGNOSIS — E1140 Type 2 diabetes mellitus with diabetic neuropathy, unspecified: Secondary | ICD-10-CM | POA: Diagnosis not present

## 2018-06-22 DIAGNOSIS — I110 Hypertensive heart disease with heart failure: Secondary | ICD-10-CM | POA: Insufficient documentation

## 2018-06-22 LAB — ETHANOL: ALCOHOL ETHYL (B): 17 mg/dL — AB (ref <10)

## 2018-06-22 LAB — URINE DRUG SCREEN, QUALITATIVE (ARMC ONLY)
AMPHETAMINES, UR SCREEN: NOT DETECTED
BARBITURATES, UR SCREEN: NOT DETECTED
Cannabinoid 50 Ng, Ur ~~LOC~~: NOT DETECTED
Cocaine Metabolite,Ur ~~LOC~~: POSITIVE — AB
MDMA (Ecstasy)Ur Screen: NOT DETECTED
Methadone Scn, Ur: NOT DETECTED
Opiate, Ur Screen: NOT DETECTED
Phencyclidine (PCP) Ur S: NOT DETECTED
TRICYCLIC, UR SCREEN: NOT DETECTED

## 2018-06-22 LAB — BASIC METABOLIC PANEL
ANION GAP: 12 (ref 5–15)
BUN: 26 mg/dL — ABNORMAL HIGH (ref 8–23)
CALCIUM: 9 mg/dL (ref 8.9–10.3)
CO2: 24 mmol/L (ref 22–32)
CREATININE: 1.9 mg/dL — AB (ref 0.61–1.24)
Chloride: 92 mmol/L — ABNORMAL LOW (ref 98–111)
GFR, EST AFRICAN AMERICAN: 42 mL/min — AB (ref >60)
GFR, EST NON AFRICAN AMERICAN: 36 mL/min — AB (ref >60)
Glucose, Bld: 157 mg/dL — ABNORMAL HIGH (ref 70–99)
Potassium: 3.9 mmol/L (ref 3.5–5.1)
SODIUM: 128 mmol/L — AB (ref 135–145)

## 2018-06-22 LAB — CBC
HCT: 37.8 % — ABNORMAL LOW (ref 40.0–52.0)
Hemoglobin: 13.6 g/dL (ref 13.0–18.0)
MCH: 32.4 pg (ref 26.0–34.0)
MCHC: 36 g/dL (ref 32.0–36.0)
MCV: 90.2 fL (ref 80.0–100.0)
PLATELETS: 161 10*3/uL (ref 150–440)
RBC: 4.19 MIL/uL — AB (ref 4.40–5.90)
RDW: 13 % (ref 11.5–14.5)
WBC: 3.2 10*3/uL — AB (ref 3.8–10.6)

## 2018-06-22 LAB — GLUCOSE, CAPILLARY: Glucose-Capillary: 143 mg/dL — ABNORMAL HIGH (ref 70–99)

## 2018-06-22 LAB — TROPONIN I

## 2018-06-22 MED ORDER — SODIUM CHLORIDE 0.9 % IV BOLUS
250.0000 mL | Freq: Once | INTRAVENOUS | Status: AC
  Administered 2018-06-22: 250 mL via INTRAVENOUS

## 2018-06-22 NOTE — ED Notes (Signed)
Pt ambulated from room to nurses' station without difficulty with steady gait.  MD notified.

## 2018-06-22 NOTE — ED Triage Notes (Signed)
Pt comes via [facility] after being found passed out. He reports having one beer after walking to the store and back to home. Girlfriend reports taht he passed out. Diaphoresis and slightly altered mental status per EMS. Oriented x4 currently. CBG 168. Temp 97.6 oral.

## 2018-06-22 NOTE — ED Provider Notes (Signed)
[facility] Emergency Department Provider Note       Time seen: ----------------------------------------- 12:32 PM on 06/22/2018 -----------------------------------------   I have reviewed the triage vital signs and the nursing notes.  HISTORY   Chief Complaint No chief complaint on file.    HPI Maurice Jordan is a 62 y.o. male with a history of alcohol abuse, congestive heart failure, depression, diabetes, hyperlipidemia and hypertension who presents to the ED for near syncope.  Patient states he went to the store to buy alcohol and he was walking back home when he had a near syncopal event.  EMS arrived to find him diaphoretic and weak in appearance.  EKGs prehospital were concerning for some ST depression diffusely.  Patient denies having any chest pain or difficulty breathing.  Past Medical History:  Diagnosis Date  . Alcohol abuse   . Congestive heart failure (HCC)   . Depression   . Diabetes (HCC)   . Hx MRSA infection 04/15/2014  . Hyperlipidemia   . Hypertension   . Neuropathy   . Osteomyelitis of toe (HCC)    3rd toe    Patient Active Problem List   Diagnosis Date Noted  . Fall 01/14/2018  . Influenza A 12/04/2017  . SIRS (systemic inflammatory response syndrome) (HCC) 08/23/2017  . Hypothermia 08/23/2017  . Hypotension 05/30/2017  . Hyponatremia 03/11/2015  . Chronic systolic congestive heart failure (HCC) 03/11/2015  . Polysubstance abuse (HCC) 03/11/2015  . Alcohol abuse 03/11/2015  . Hypertension 03/11/2015  . Diabetes mellitus (HCC) 03/11/2015  . Rhabdomyolysis 03/11/2015    Past Surgical History:  Procedure Laterality Date  . AMPUTATION OF REPLICATED TOES      Allergies Patient has no known allergies.  Social History Social History   Tobacco Use  . Smoking status: Never Smoker  . Smokeless tobacco: Former Engineer, water Use Topics  . Alcohol use: Yes    Alcohol/week: 8.0 standard drinks    Types: 8 Cans of  beer per week  . Drug use: No   Review of Systems Constitutional: Negative for fever. Cardiovascular: Negative for chest pain. Respiratory: Negative for shortness of breath. Gastrointestinal: Negative for abdominal pain, vomiting and diarrhea. Musculoskeletal: Negative for back pain. Skin: Positive for diaphoresis Neurological: Negative for headaches, focal weakness or numbness.  All systems negative/normal/unremarkable except as stated in the HPI  ____________________________________________   PHYSICAL EXAM:  VITAL SIGNS: ED Triage Vitals [06/22/18 1231]  Enc Vitals Group     BP (!) 140/91     Pulse Rate 82     Resp 18     Temp 97.6 F (36.4 C)     Temp Source Oral     SpO2 98 %     Weight      Height      Head Circumference      Peak Flow      Pain Score 0     Pain Loc      Pain Edu?      Excl. in GC?    Constitutional: Alert and oriented.  Mild distress Eyes: Conjunctivae are normal. Normal extraocular movements. ENT   Head: Normocephalic and atraumatic.   Nose: No congestion/rhinnorhea.   Mouth/Throat: Mucous membranes are moist.   Neck: No stridor. Cardiovascular: Normal rate, regular rhythm. No murmurs, rubs, or gallops. Respiratory: Normal respiratory effort without tachypnea nor retractions. Breath sounds are clear and equal bilaterally. No wheezes/rales/rhonchi. Gastrointestinal: Soft and nontender. Normal bowel sounds Musculoskeletal: Nontender with normal range of motion in  extremities. No lower extremity tenderness nor edema. Neurologic:  Normal speech and language. No gross focal neurologic deficits are appreciated.  Skin:  Skin is warm, dry with diaphoresis noted Psychiatric: Mood and affect are normal. Speech and behavior are normal.  ____________________________________________  EKG: Interpreted by me.  Sinus rhythm the rate of 82 bpm, LVH, possible anterior infarct age-indeterminate, leftward  axis  ____________________________________________  ED COURSE:  As part of my medical decision making, I reviewed the following data within the electronic MEDICAL RECORD NUMBER History obtained from family if available, nursing notes, old chart and ekg, as well as notes from prior ED visits. Patient presented for near syncope, we will assess with labs and imaging as indicated at this time.   Procedures ____________________________________________   LABS (pertinent positives/negatives)  Labs Reviewed  GLUCOSE, CAPILLARY - Abnormal; Notable for the following components:      Result Value   Glucose-Capillary 143 (*)    All other components within normal limits  BASIC METABOLIC PANEL - Abnormal; Notable for the following components:   Sodium 128 (*)    Chloride 92 (*)    Glucose, Bld 157 (*)    BUN 26 (*)    Creatinine, Ser 1.90 (*)    GFR calc non Af Amer 36 (*)    GFR calc Af Amer 42 (*)    All other components within normal limits  CBC - Abnormal; Notable for the following components:   WBC 3.2 (*)    RBC 4.19 (*)    HCT 37.8 (*)    All other components within normal limits  ETHANOL - Abnormal; Notable for the following components:   Alcohol, Ethyl (B) 17 (*)    All other components within normal limits  URINE DRUG SCREEN, QUALITATIVE ([facility] ONLY) - Abnormal; Notable for the following components:   Cocaine Metabolite,Ur [place] POSITIVE (*)    Benzodiazepine, Ur Scrn TEST NOT PERFORMED, REAGENT NOT AVAILABLE (*)    All other components within normal limits  TROPONIN I    RADIOLOGY Images were viewed by me  Chest x-ray IMPRESSION: Left basilar scarring/atelectasis. No active cardiopulmonary disease. ____________________________________________  DIFFERENTIAL DIAGNOSIS   Intoxication, substance use disorder, MI, unstable angina, hypoglycemia, dehydration, electrolyte abnormality, occult infection  FINAL ASSESSMENT AND PLAN  Near syncope   Plan: The patient had presented  for near syncope. Patient's labs revealed chronic kidney disease, hyponatremia, mild alcohol intoxication and urine drug screen was positive for cocaine. Patient's imaging was negative for any acute process.  I suspect his syncopal or near syncopal event had to do with alcohol and/or cocaine abuse.  He denies any complaints at this time, appears cleared for outpatient follow-up.   Ulice Dash, MD   Note: This note was generated in part or whole with voice recognition software. Voice recognition is usually quite accurate but there are transcription errors that can and very often do occur. I apologize for any typographical errors that were not detected and corrected.     Emily Filbert, MD 06/22/18 1425

## 2018-08-11 ENCOUNTER — Ambulatory Visit: Payer: Medicaid Other | Admitting: Family

## 2018-08-18 NOTE — Progress Notes (Signed)
Patient ID: Maurice Jordan, male    DOB: 1956/03/24, 62 y.o.   MRN: [id number]  HPI  Maurice Jordan is a 62 y/o male with a history of depression, DM, hyperlipidemia, HTN, osteomyelitis, current alcohol use and chronic heart failure.   Echo report from 05/19/17 reviewed and shows an EF of 30% along with mild Maurice and normal pulmonary artery pressures.  Was in the ED 06/22/18 due to syncopal event due to mild alcohol intoxication. Urine toxicology was positive for cocaine. Treated and released.   He presents today for a follow up visit with a chief complaint of minimal shortness of breath upon moderate exertion. He describes this as chronic in nature having been present for several years. He has associated fatigue along with this. He denies any difficulty sleeping, abdominal distention, palpitations, pedal edema, chest pain, cough or dizziness. Says that his scales broke so hasn't been able to weigh himself recently. Drinks ~ 32 ounces of beer daily.  Past Medical History:  Diagnosis Date  . Alcohol abuse   . Congestive heart failure (HCC)   . Depression   . Diabetes (HCC)   . Hx MRSA infection 04/15/2014  . Hyperlipidemia   . Hypertension   . Neuropathy   . Osteomyelitis of toe (HCC)    3rd toe   Past Surgical History:  Procedure Laterality Date  . AMPUTATION OF REPLICATED TOES     Family History  Problem Relation Age of Onset  . Diabetes Mother   . Hypertension Mother    Social History   Tobacco Use  . Smoking status: Never Smoker  . Smokeless tobacco: Former Engineer, water Use Topics  . Alcohol use: Yes    Alcohol/week: 8.0 standard drinks    Types: 8 Cans of beer per week   No Known Allergies  Prior to Admission medications   Medication Sig Start Date End Date Taking? Authorizing Provider  aspirin 81 MG chewable tablet Chew 81 mg by mouth daily.   Yes [provider]  atorvastatin (LIPITOR) 80 MG tablet Take 1 tablet (80 mg total) by mouth every evening. 05/20/17   Yes Enid Baas, MD  carvedilol (COREG) 12.5 MG tablet Take 1 tablet (12.5 mg total) by mouth 2 (two) times daily. 03/24/18 08/19/18 Yes Niyam Bisping, Inetta Fermo A, FNP  citalopram (CELEXA) 20 MG tablet Take 1 tablet (20 mg total) by mouth daily. 05/20/17  Yes Enid Baas, MD  docusate sodium (COLACE) 100 MG capsule Take 200 mg by mouth 2 (two) times daily.   Yes [provider]  furosemide (LASIX) 20 MG tablet Take 20 mg by mouth 2 (two) times daily.    Yes [provider]  gabapentin (NEURONTIN) 300 MG capsule Take 900 mg by mouth at bedtime.   Yes [provider]  insulin glargine (LANTUS) 100 UNIT/ML injection Inject 0.25 mLs (25 Units total) into the skin at bedtime. Patient taking differently: Inject 27 Units into the skin at bedtime. 7 units before meals. 27 units at bedtime 02/10/18  Yes Sainani, Rolly Pancake, MD  losartan (COZAAR) 25 MG tablet Take 12.5 mg by mouth daily.    Yes [provider]  tamsulosin (FLOMAX) 0.4 MG CAPS capsule Take 1 capsule (0.4 mg total) by mouth daily. 05/20/17  Yes Enid Baas, MD    Review of Systems  Constitutional: Positive for fatigue (improving). Negative for appetite change and fever.  HENT: Negative for congestion, rhinorrhea and sore throat.   Eyes: Negative.   Respiratory: Positive for shortness of breath (  minimal). Negative for cough and chest tightness.   Cardiovascular: Negative for chest pain, palpitations and leg swelling.  Gastrointestinal: Negative for abdominal distention and abdominal pain.  Endocrine: Negative.   Genitourinary: Negative.   Musculoskeletal: Negative for back pain and neck pain.  Skin: Negative.   Allergic/Immunologic: Negative.   Neurological: Negative for dizziness and light-headedness.  Hematological: Negative for adenopathy. Does not bruise/bleed easily.  Psychiatric/Behavioral: Negative for dysphoric mood and sleep disturbance (sleeping on 2 pillows). The patient is not  nervous/anxious.    Vitals:   08/19/18 0901  BP: (!) 141/93  Pulse: 87  Resp: 18  SpO2: 100%  Weight: 176 lb 2 oz (79.9 kg)  Height: 5\' 6"  (1.676 m)   Wt Readings from Last 3 Encounters:  08/19/18 176 lb 2 oz (79.9 kg)  06/22/18 171 lb (77.6 kg)  05/07/18 171 lb 4 oz (77.7 kg)   Lab Results  Component Value Date   CREATININE 1.90 (H) 06/22/2018   CREATININE 1.91 (H) 03/24/2018   CREATININE 1.62 (H) 02/10/2018    Physical Exam  Constitutional: He is oriented to person, place, and time. He appears well-developed and well-nourished.  HENT:  Head: Normocephalic and atraumatic.  Neck: Normal range of motion. Neck supple. No JVD present.  Cardiovascular: Normal rate and regular rhythm.  Pulmonary/Chest: Effort normal and breath sounds normal.  Abdominal: Soft. He exhibits no distension. There is no tenderness.  Musculoskeletal: He exhibits no edema or tenderness.  Neurological: He is alert and oriented to person, place, and time.  Skin: Skin is warm and dry.  Psychiatric: He has a normal mood and affect. His behavior is normal. Thought content normal.  Nursing note and vitals reviewed.  Assessment & Plan:  1: Chronic heart failure with reduced ejection fraction-  - NYHA class II - euvolemic today - not weighing daily as his scales broke; set of scales given to him today and he was reminded to call for an overnight weight gain of >2 pounds or a weekly weight gain of >5 pounds - weight up 5 pounds since last visit here 3 months ago - not adding salt - losartan had been decreased by PCP due to hyperkalemia; had hypotension with entresto previously - if BP remains elevated may try changing his losartan back to low dose entresto again - Reviewed fluid intake; No more than 40-50 ounces/day  - encouraged complete alcohol cessation; drinking ~ 32 ounces of beer daily - BNP 02/13/15 was 414 - reports receiving his flu vaccine for this season  2: HTN- - BP mildly elevated today -  will increase his carvedilol to 25mg  BID; he is to finish his current bottle by taking 2 of his 12.5mg  tablets twice daily until they are gone and then begin the 25mg  dose twice daily - BMP from 06/22/18 reviewed and showed sodium 128, potassium 3.9, creatinine 1.90 and GFR 42  3: Diabetes- - fasting glucose at home yesterday was 162  - continues on Lantus  - saw PCP Rubye Oaks) 03/12/18 - PCP made nephrology referral  Medication bottles were reviewed.  Return in 1 month or sooner for any questions/problems before then.     Marland Kitchen

## 2018-08-19 ENCOUNTER — Ambulatory Visit: Payer: Medicaid Other | Attending: Family | Admitting: Family

## 2018-08-19 ENCOUNTER — Encounter: Payer: Self-pay | Admitting: Family

## 2018-08-19 VITALS — BP 141/93 | HR 87 | Resp 18 | Ht 66.0 in | Wt 176.1 lb

## 2018-08-19 DIAGNOSIS — N183 Chronic kidney disease, stage 3 (moderate): Secondary | ICD-10-CM

## 2018-08-19 DIAGNOSIS — Z79899 Other long term (current) drug therapy: Secondary | ICD-10-CM | POA: Insufficient documentation

## 2018-08-19 DIAGNOSIS — Z8614 Personal history of Methicillin resistant Staphylococcus aureus infection: Secondary | ICD-10-CM | POA: Insufficient documentation

## 2018-08-19 DIAGNOSIS — I110 Hypertensive heart disease with heart failure: Secondary | ICD-10-CM | POA: Insufficient documentation

## 2018-08-19 DIAGNOSIS — E785 Hyperlipidemia, unspecified: Secondary | ICD-10-CM | POA: Diagnosis not present

## 2018-08-19 DIAGNOSIS — I509 Heart failure, unspecified: Secondary | ICD-10-CM | POA: Diagnosis present

## 2018-08-19 DIAGNOSIS — I5022 Chronic systolic (congestive) heart failure: Secondary | ICD-10-CM | POA: Diagnosis not present

## 2018-08-19 DIAGNOSIS — Z794 Long term (current) use of insulin: Secondary | ICD-10-CM | POA: Insufficient documentation

## 2018-08-19 DIAGNOSIS — E1022 Type 1 diabetes mellitus with diabetic chronic kidney disease: Secondary | ICD-10-CM

## 2018-08-19 DIAGNOSIS — E1140 Type 2 diabetes mellitus with diabetic neuropathy, unspecified: Secondary | ICD-10-CM | POA: Diagnosis not present

## 2018-08-19 DIAGNOSIS — F329 Major depressive disorder, single episode, unspecified: Secondary | ICD-10-CM | POA: Diagnosis not present

## 2018-08-19 DIAGNOSIS — I10 Essential (primary) hypertension: Secondary | ICD-10-CM

## 2018-08-19 DIAGNOSIS — Z8249 Family history of ischemic heart disease and other diseases of the circulatory system: Secondary | ICD-10-CM | POA: Insufficient documentation

## 2018-08-19 DIAGNOSIS — Z7982 Long term (current) use of aspirin: Secondary | ICD-10-CM | POA: Insufficient documentation

## 2018-08-19 DIAGNOSIS — Z833 Family history of diabetes mellitus: Secondary | ICD-10-CM | POA: Diagnosis not present

## 2018-08-19 MED ORDER — CARVEDILOL 25 MG PO TABS: 25.0000 mg | ORAL_TABLET | Freq: Two times a day (BID) | ORAL | 5 refills | Status: DC

## 2018-08-19 NOTE — Patient Instructions (Signed)
Continue weighing daily and call for an overnight weight gain of > 2 pounds or a weekly weight gain of >5 pounds.  Increase your carvedilol to 2 tablets twice daily until gone. Then pick up the new prescription of the 25mg  tablets and then you will only take 1 tablet twice daily.

## 2018-08-20 ENCOUNTER — Encounter: Payer: Self-pay | Admitting: Family

## 2018-09-16 ENCOUNTER — Ambulatory Visit: Payer: Medicaid Other | Admitting: Family

## 2018-09-16 NOTE — Progress Notes (Deleted)
Patient ID: Maurice Jordan, male    DOB: Oct 04, 1956, 62 y.o.   MRN: [id number]  HPI  Maurice Jordan is a 62 y/o male with a history of depression, DM, hyperlipidemia, HTN, osteomyelitis, current alcohol use and chronic heart failure.   Echo report from 05/19/17 reviewed and shows an EF of 30% along with mild Maurice and normal pulmonary artery pressures.  Was in the ED 06/22/18 due to syncopal event due to mild alcohol intoxication. Urine toxicology was positive for cocaine. Treated and released.   He presents today for a follow up visit with a chief complaint of  Past Medical History:  Diagnosis Date  . Alcohol abuse   . Congestive heart failure (HCC)   . Depression   . Diabetes (HCC)   . Hx MRSA infection 04/15/2014  . Hyperlipidemia   . Hypertension   . Neuropathy   . Osteomyelitis of toe (HCC)    3rd toe   Past Surgical History:  Procedure Laterality Date  . AMPUTATION OF REPLICATED TOES     Family History  Problem Relation Age of Onset  . Diabetes Mother   . Hypertension Mother    Social History   Tobacco Use  . Smoking status: Never Smoker  . Smokeless tobacco: Former Engineer, water Use Topics  . Alcohol use: Yes    Alcohol/week: 8.0 standard drinks    Types: 8 Cans of beer per week   No Known Allergies    Review of Systems  Constitutional: Positive for fatigue (improving). Negative for appetite change and fever.  HENT: Negative for congestion, rhinorrhea and sore throat.   Eyes: Negative.   Respiratory: Positive for shortness of breath (minimal). Negative for cough and chest tightness.   Cardiovascular: Negative for chest pain, palpitations and leg swelling.  Gastrointestinal: Negative for abdominal distention and abdominal pain.  Endocrine: Negative.   Genitourinary: Negative.   Musculoskeletal: Negative for back pain and neck pain.  Skin: Negative.   Allergic/Immunologic: Negative.   Neurological: Negative for dizziness and light-headedness.  Hematological:  Negative for adenopathy. Does not bruise/bleed easily.  Psychiatric/Behavioral: Negative for dysphoric mood and sleep disturbance (sleeping on 2 pillows). The patient is not nervous/anxious.     Physical Exam  Constitutional: He is oriented to person, place, and time. He appears well-developed and well-nourished.  HENT:  Head: Normocephalic and atraumatic.  Neck: Normal range of motion. Neck supple. No JVD present.  Cardiovascular: Normal rate and regular rhythm.  Pulmonary/Chest: Effort normal and breath sounds normal.  Abdominal: Soft. He exhibits no distension. There is no tenderness.  Musculoskeletal: He exhibits no edema or tenderness.  Neurological: He is alert and oriented to person, place, and time.  Skin: Skin is warm and dry.  Psychiatric: He has a normal mood and affect. His behavior is normal. Thought content normal.  Nursing note and vitals reviewed.  Assessment & Plan:  1: Chronic heart failure with reduced ejection fraction-  - NYHA class II - euvolemic today - not weighing daily as his scales broke; set of scales given to him today and he was reminded to call for an overnight weight gain of >2 pounds or a weekly weight gain of >5 pounds - weight  - not adding salt - losartan had been decreased by PCP due to hyperkalemia; had hypotension with entresto previously - if BP remains elevated may try changing his losartan back to low dose entresto again - Reviewed fluid intake; No more than 40-50 ounces/day  - encouraged complete alcohol cessation; drinking ~  32 ounces of beer daily - BNP 02/13/15 was 414 - reports receiving his flu vaccine for this season  2: HTN- - BP  - will increase his carvedilol to 25mg  BID; he is to finish his current bottle by taking 2 of his 12.5mg  tablets twice daily until they are gone and then begin the 25mg  dose twice daily - BMP from 06/22/18 reviewed and showed sodium 128, potassium 3.9, creatinine 1.90 and GFR 42  3: Diabetes- - fasting  glucose at home yesterday was  - continues on Lantus  - saw PCP Maurice Jordan) 03/12/18 - PCP made nephrology referral  Medication bottles were reviewed.      Marland Kitchen

## 2018-09-17 NOTE — Progress Notes (Signed)
Patient ID: Maurice Jordan, male    DOB: 06-06-1956, 62 y.o.   MRN: [id number]  HPI  Mr Ruppert is a 62 y/o male with a history of depression, DM, hyperlipidemia, HTN, osteomyelitis, current alcohol use and chronic heart failure.   Echo report from 05/19/17 reviewed and shows an EF of 30% along with mild MR and normal pulmonary artery pressures.  Was in the ED 06/22/18 due to syncopal event due to mild alcohol intoxication. Urine toxicology was positive for cocaine. Treated and released.   He presents today for a follow up visit with a chief complaint of minimal shortness of breath upon moderate exertion. He describes this as chronic in nature having been present for several years. He has associated fatigue along with this. He denies any difficulty sleeping, dizziness, cough, chest pain, pedal edema, palpitations, abdominal distention or weight gain.   Past Medical History:  Diagnosis Date  . Alcohol abuse   . Congestive heart failure (HCC)   . Depression   . Diabetes (HCC)   . Hx MRSA infection 04/15/2014  . Hyperlipidemia   . Hypertension   . Neuropathy   . Osteomyelitis of toe (HCC)    3rd toe   Past Surgical History:  Procedure Laterality Date  . AMPUTATION OF REPLICATED TOES     Family History  Problem Relation Age of Onset  . Diabetes Mother   . Hypertension Mother    Social History   Tobacco Use  . Smoking status: Never Smoker  . Smokeless tobacco: Former Engineer, water Use Topics  . Alcohol use: Yes    Alcohol/week: 8.0 standard drinks    Types: 8 Cans of beer per week   No Known Allergies  Prior to Admission medications   Medication Sig Start Date End Date Taking? Authorizing Provider  aspirin 81 MG chewable tablet Chew 81 mg by mouth daily.   Yes [provider]  atorvastatin (LIPITOR) 80 MG tablet Take 1 tablet (80 mg total) by mouth every evening. 05/20/17  Yes Enid Baas, MD  carvedilol (COREG) 25 MG tablet Take 1 tablet (25 mg total) by  mouth 2 (two) times daily. 08/19/18 11/17/18 Yes Jantz Main, Inetta Fermo A, FNP  citalopram (CELEXA) 20 MG tablet Take 1 tablet (20 mg total) by mouth daily. 05/20/17  Yes Enid Baas, MD  docusate sodium (COLACE) 100 MG capsule Take 200 mg by mouth 2 (two) times daily.   Yes [provider]  furosemide (LASIX) 20 MG tablet Take 20 mg by mouth 2 (two) times daily.    Yes [provider]  gabapentin (NEURONTIN) 300 MG capsule Take 900 mg by mouth at bedtime.   Yes [provider]  insulin glargine (LANTUS) 100 UNIT/ML injection Inject 0.25 mLs (25 Units total) into the skin at bedtime. Patient taking differently: Inject 27 Units into the skin at bedtime. 8 units before meals. 28 units at bedtime 02/10/18  Yes Sainani, Rolly Pancake, MD  losartan (COZAAR) 25 MG tablet Take 12.5 mg by mouth daily.    Yes [provider]  tamsulosin (FLOMAX) 0.4 MG CAPS capsule Take 1 capsule (0.4 mg total) by mouth daily. 05/20/17  Yes Enid Baas, MD     Review of Systems  Constitutional: Positive for fatigue (sometimes). Negative for appetite change and fever.  HENT: Negative for congestion, rhinorrhea and sore throat.   Eyes: Negative.   Respiratory: Positive for shortness of breath (minimal). Negative for cough and chest tightness.   Cardiovascular: Negative for chest pain, palpitations and  leg swelling.  Gastrointestinal: Negative for abdominal distention and abdominal pain.  Endocrine: Negative.   Genitourinary: Negative.   Musculoskeletal: Negative for back pain and neck pain.  Skin: Negative.   Allergic/Immunologic: Negative.   Neurological: Negative for dizziness and light-headedness.  Hematological: Negative for adenopathy. Does not bruise/bleed easily.  Psychiatric/Behavioral: Negative for dysphoric mood and sleep disturbance (sleeping on 2 pillows). The patient is not nervous/anxious.    Vitals:   09/21/18 1508  BP: (!) 140/99  Pulse: 88  Resp: 18  SpO2: 97%   Weight: 176 lb (79.8 kg)  Height: 5\' 6"  (1.676 m)   Wt Readings from Last 3 Encounters:  09/21/18 176 lb (79.8 kg)  08/19/18 176 lb 2 oz (79.9 kg)  06/22/18 171 lb (77.6 kg)   Lab Results  Component Value Date   CREATININE 1.90 (H) 06/22/2018   CREATININE 1.91 (H) 03/24/2018   CREATININE 1.62 (H) 02/10/2018    Physical Exam  Constitutional: He is oriented to person, place, and time. He appears well-developed and well-nourished.  HENT:  Head: Normocephalic and atraumatic.  Neck: Normal range of motion. Neck supple. No JVD present.  Cardiovascular: Normal rate and regular rhythm.  Pulmonary/Chest: Effort normal and breath sounds normal.  Abdominal: Soft. He exhibits no distension. There is no tenderness.  Musculoskeletal: He exhibits no edema or tenderness.  Neurological: He is alert and oriented to person, place, and time.  Skin: Skin is warm and dry.  Psychiatric: He has a normal mood and affect. His behavior is normal. Thought content normal.  Nursing note and vitals reviewed.  Assessment & Plan:  1: Chronic heart failure with reduced ejection fraction-  - NYHA class II - euvolemic today - weighing daily; reminded to call for an overnight weight gain of >2 pounds or a weekly weight gain of >5 pounds - weight unchanged from last visit here 1 month ago - not adding salt - losartan had been decreased by PCP due to hyperkalemia; had hypotension with entresto previously - consider changing losartan back to entresto if able - Reviewed fluid intake; No more than 40-50 ounces/day  - encouraged complete alcohol cessation; drinking ~ 36 ounces of beer daily - BNP 02/13/15 was 414 - reports receiving his flu vaccine for this season  2: HTN- - BP mildly elevated today and has been trending up - will increase losartan to 25mg  daily; may need valtessa if potassium level rises - sees PCP next week and he will ask PCP to check BMP - carvedilol increased at last visit to 25mg  BID -  BMP from 06/22/18 reviewed and showed sodium 128, potassium 3.9, creatinine 1.90 and GFR 42  3: Diabetes- - fasting glucose at home today was 90 - continues on Lantus  - saw PCP Rubye Oaks) 03/12/18 - PCP made nephrology referral  Medication bottles were reviewed.  Return in 1 month or sooner for any questions/problems before then.     Marland Kitchen

## 2018-09-21 ENCOUNTER — Ambulatory Visit: Payer: Medicaid Other | Attending: Family | Admitting: Family

## 2018-09-21 ENCOUNTER — Encounter: Payer: Self-pay | Admitting: Family

## 2018-09-21 VITALS — BP 140/99 | HR 88 | Resp 18 | Ht 66.0 in | Wt 176.0 lb

## 2018-09-21 DIAGNOSIS — Z79899 Other long term (current) drug therapy: Secondary | ICD-10-CM | POA: Insufficient documentation

## 2018-09-21 DIAGNOSIS — Z8614 Personal history of Methicillin resistant Staphylococcus aureus infection: Secondary | ICD-10-CM | POA: Insufficient documentation

## 2018-09-21 DIAGNOSIS — Z7982 Long term (current) use of aspirin: Secondary | ICD-10-CM | POA: Diagnosis not present

## 2018-09-21 DIAGNOSIS — Z8249 Family history of ischemic heart disease and other diseases of the circulatory system: Secondary | ICD-10-CM | POA: Diagnosis not present

## 2018-09-21 DIAGNOSIS — F329 Major depressive disorder, single episode, unspecified: Secondary | ICD-10-CM | POA: Diagnosis not present

## 2018-09-21 DIAGNOSIS — Z9889 Other specified postprocedural states: Secondary | ICD-10-CM | POA: Diagnosis not present

## 2018-09-21 DIAGNOSIS — E1022 Type 1 diabetes mellitus with diabetic chronic kidney disease: Secondary | ICD-10-CM

## 2018-09-21 DIAGNOSIS — I5022 Chronic systolic (congestive) heart failure: Secondary | ICD-10-CM | POA: Diagnosis present

## 2018-09-21 DIAGNOSIS — E1140 Type 2 diabetes mellitus with diabetic neuropathy, unspecified: Secondary | ICD-10-CM | POA: Insufficient documentation

## 2018-09-21 DIAGNOSIS — Z794 Long term (current) use of insulin: Secondary | ICD-10-CM | POA: Diagnosis not present

## 2018-09-21 DIAGNOSIS — N183 Chronic kidney disease, stage 3 unspecified: Secondary | ICD-10-CM

## 2018-09-21 DIAGNOSIS — Z833 Family history of diabetes mellitus: Secondary | ICD-10-CM | POA: Diagnosis not present

## 2018-09-21 DIAGNOSIS — I110 Hypertensive heart disease with heart failure: Secondary | ICD-10-CM | POA: Diagnosis not present

## 2018-09-21 DIAGNOSIS — F1011 Alcohol abuse, in remission: Secondary | ICD-10-CM | POA: Insufficient documentation

## 2018-09-21 DIAGNOSIS — E785 Hyperlipidemia, unspecified: Secondary | ICD-10-CM | POA: Insufficient documentation

## 2018-09-21 DIAGNOSIS — I10 Essential (primary) hypertension: Secondary | ICD-10-CM

## 2018-09-21 NOTE — Patient Instructions (Addendum)
Continue weighing daily and call for an overnight weight gain of > 2 pounds or a weekly weight gain of >5 pounds.  Increase losartan to 1 tablet daily.   Ask Dr. Rubye Oaks to check a BMP when you go there next week.

## 2018-10-19 ENCOUNTER — Ambulatory Visit: Payer: Medicaid Other | Admitting: Family

## 2018-11-03 ENCOUNTER — Ambulatory Visit: Payer: Medicaid Other | Attending: Family | Admitting: Family

## 2018-11-03 ENCOUNTER — Encounter: Payer: Self-pay | Admitting: Family

## 2018-11-03 VITALS — BP 92/68 | HR 79 | Resp 18 | Ht 66.0 in | Wt 174.4 lb

## 2018-11-03 DIAGNOSIS — E875 Hyperkalemia: Secondary | ICD-10-CM | POA: Insufficient documentation

## 2018-11-03 DIAGNOSIS — N183 Chronic kidney disease, stage 3 (moderate): Secondary | ICD-10-CM

## 2018-11-03 DIAGNOSIS — E1022 Type 1 diabetes mellitus with diabetic chronic kidney disease: Secondary | ICD-10-CM

## 2018-11-03 DIAGNOSIS — I5022 Chronic systolic (congestive) heart failure: Secondary | ICD-10-CM | POA: Diagnosis not present

## 2018-11-03 DIAGNOSIS — Z7982 Long term (current) use of aspirin: Secondary | ICD-10-CM | POA: Diagnosis not present

## 2018-11-03 DIAGNOSIS — Z8249 Family history of ischemic heart disease and other diseases of the circulatory system: Secondary | ICD-10-CM | POA: Insufficient documentation

## 2018-11-03 DIAGNOSIS — I110 Hypertensive heart disease with heart failure: Secondary | ICD-10-CM | POA: Insufficient documentation

## 2018-11-03 DIAGNOSIS — Z8614 Personal history of Methicillin resistant Staphylococcus aureus infection: Secondary | ICD-10-CM | POA: Diagnosis not present

## 2018-11-03 DIAGNOSIS — Z79899 Other long term (current) drug therapy: Secondary | ICD-10-CM | POA: Insufficient documentation

## 2018-11-03 DIAGNOSIS — R0602 Shortness of breath: Secondary | ICD-10-CM | POA: Insufficient documentation

## 2018-11-03 DIAGNOSIS — E785 Hyperlipidemia, unspecified: Secondary | ICD-10-CM | POA: Insufficient documentation

## 2018-11-03 DIAGNOSIS — Z794 Long term (current) use of insulin: Secondary | ICD-10-CM | POA: Insufficient documentation

## 2018-11-03 DIAGNOSIS — E1140 Type 2 diabetes mellitus with diabetic neuropathy, unspecified: Secondary | ICD-10-CM | POA: Diagnosis not present

## 2018-11-03 DIAGNOSIS — I10 Essential (primary) hypertension: Secondary | ICD-10-CM

## 2018-11-03 NOTE — Progress Notes (Signed)
Patient ID: Maurice Jordan, male    DOB: 08-23-1956, 63 y.o.   MRN: [id number]  HPI  [name] is a 63 y/o male with a history of depression, DM, hyperlipidemia, HTN, osteomyelitis, current alcohol use and chronic heart failure.   Echo report from 05/19/17 reviewed and shows an EF of 30% along with mild MR and normal pulmonary artery pressures.  Was in the ED 06/22/18 due to syncopal event due to mild alcohol intoxication. Urine toxicology was positive for cocaine. Treated and released.   He presents today for a follow up visit with a chief complaint of minimal shortness of breath upon moderate exertion. He describes this as chronic in nature having been present for several years. He has associated fatigue and light-headedness along with this. He denies any difficulty sleeping, abdominal distention, palpitations, pedal edema, chest pain, cough or weight gain. Does does dizziness after he takes his medications in the morning. Says that he's been taking 40mg  furosemide AM and sometimes 40mg  PM.   Past Medical History:  Diagnosis Date  . Alcohol abuse   . Congestive heart failure (HCC)   . Depression   . Diabetes (HCC)   . Hx MRSA infection 04/15/2014  . Hyperlipidemia   . Hypertension   . Neuropathy   . Osteomyelitis of toe (HCC)    3rd toe   Past Surgical History:  Procedure Laterality Date  . AMPUTATION OF REPLICATED TOES     Family History  Problem Relation Age of Onset  . Diabetes Mother   . Hypertension Mother    Social History   Tobacco Use  . Smoking status: Never Smoker  . Smokeless tobacco: Former Engineer, water Use Topics  . Alcohol use: Yes    Alcohol/week: 8.0 standard drinks    Types: 8 Cans of beer per week   No Known Allergies  Prior to Admission medications   Medication Sig Start Date End Date Taking? Authorizing Provider  aspirin 81 MG chewable tablet Chew 81 mg by mouth daily.   Yes [provider]  atorvastatin (LIPITOR) 80 MG tablet Take 1  tablet (80 mg total) by mouth every evening. 05/20/17  Yes Enid Baas, MD  carvedilol (COREG) 25 MG tablet Take 1 tablet (25 mg total) by mouth 2 (two) times daily. 08/19/18 11/17/18 Yes Asuncion Shibata, Inetta Fermo A, FNP  citalopram (CELEXA) 20 MG tablet Take 1 tablet (20 mg total) by mouth daily. 05/20/17  Yes Enid Baas, MD  docusate sodium (COLACE) 100 MG capsule Take 200 mg by mouth 2 (two) times daily.   Yes [provider]  furosemide (LASIX) 20 MG tablet Take 40 mg by mouth 2 (two) times daily.    Yes [provider]  gabapentin (NEURONTIN) 300 MG capsule Take 900 mg by mouth at bedtime.   Yes [provider]  insulin glargine (LANTUS) 100 UNIT/ML injection Inject 0.25 mLs (25 Units total) into the skin at bedtime. Patient taking differently: Inject 27 Units into the skin at bedtime. 8 units before meals. 28 units at bedtime 02/10/18  Yes Sainani, Rolly Pancake, MD  losartan (COZAAR) 25 MG tablet Take 25 mg by mouth daily.    Yes [provider]  tamsulosin (FLOMAX) 0.4 MG CAPS capsule Take 1 capsule (0.4 mg total) by mouth daily. 05/20/17  Yes Enid Baas, MD     Review of Systems  Constitutional: Positive for fatigue (sometimes). Negative for appetite change and fever.  HENT: Negative for congestion, rhinorrhea and sore throat.   Eyes: Negative.  Respiratory: Positive for shortness of breath (minimal). Negative for cough and chest tightness.   Cardiovascular: Negative for chest pain, palpitations and leg swelling.  Gastrointestinal: Negative for abdominal distention and abdominal pain.  Endocrine: Negative.   Genitourinary: Negative.   Musculoskeletal: Negative for back pain and neck pain.  Skin: Negative.   Allergic/Immunologic: Negative.   Neurological: Positive for light-headedness (after taking morning medications). Negative for dizziness.  Hematological: Negative for adenopathy. Does not bruise/bleed easily.  Psychiatric/Behavioral:  Negative for dysphoric mood and sleep disturbance (sleeping on 2 pillows). The patient is not nervous/anxious.    Vitals:   11/03/18 1246  BP: 92/68  Pulse: 79  Resp: 18  SpO2: 99%  Weight: 174 lb 6 oz (79.1 kg)  Height: 5\' 6"  (1.676 m)   Wt Readings from Last 3 Encounters:  11/03/18 174 lb 6 oz (79.1 kg)  09/21/18 176 lb (79.8 kg)  08/19/18 176 lb 2 oz (79.9 kg)   Lab Results  Component Value Date   CREATININE 1.90 (H) 06/22/2018   CREATININE 1.91 (H) 03/24/2018   CREATININE 1.62 (H) 02/10/2018    Physical Exam  Constitutional: He is oriented to person, place, and time. He appears well-developed and well-nourished.  HENT:  Head: Normocephalic and atraumatic.  Neck: Normal range of motion. Neck supple. No JVD present.  Cardiovascular: Normal rate and regular rhythm.  Pulmonary/Chest: Effort normal and breath sounds normal.  Abdominal: Soft. He exhibits no distension. There is no abdominal tenderness.  Musculoskeletal:        General: No tenderness or edema.  Neurological: He is alert and oriented to person, place, and time.  Skin: Skin is warm and dry.  Psychiatric: He has a normal mood and affect. His behavior is normal. Thought content normal.  Nursing note and vitals reviewed.  Assessment & Plan:  1: Chronic heart failure with reduced ejection fraction-  - NYHA class II - euvolemic today - weighing daily; reminded to call for an overnight weight gain of >2 pounds or a weekly weight gain of >5 pounds - weight down ~ 2 pounds from last visit here 6 weeks ago - not adding salt - losartan had been decreased by PCP due to hyperkalemia; had hypotension with entresto previously - consider changing losartan back to entresto if able - Reviewed fluid intake; No more than 40-50 ounces/day  - encouraged complete alcohol cessation; drinking ~ 36 ounces of beer daily - BNP 02/13/15 was 414 - reports receiving his flu vaccine for this season  2: HTN- - BP low today; will  decrease his furosemide to 20mg  daily with additional 20mg  as needed for above weight gain, edema or worsening shortness of breath - may need valtessa if potassium level rises - sees PCP tomorrow and RX written for BMP to be drawn tomorrow - BMP from 06/22/18 reviewed and showed sodium 128, potassium 3.9, creatinine 1.90 and GFR 42  3: Diabetes- - fasting glucose at home today was 229 - continues on Lantus  - saw PCP Rubye Oaks) 03/12/18 - PCP made nephrology referral  Medication bottles were reviewed.  Return in 2 months or sooner for any questions/problems before then.       Marland Kitchen

## 2018-11-03 NOTE — Patient Instructions (Addendum)
Continue weighing daily and call for an overnight weight gain of > 2 pounds or a weekly weight gain of >5 pounds.  Decrease furosemide (lasix) to 1 tablet daily (20mg  tablet). Take an additional tablet if needed for above weight gain, swelling or shortness of breath.

## 2018-11-25 ENCOUNTER — Other Ambulatory Visit: Payer: Self-pay | Admitting: Family

## 2018-11-25 ENCOUNTER — Other Ambulatory Visit: Payer: Medicaid Other

## 2018-12-28 NOTE — Progress Notes (Deleted)
Patient ID: Maurice Jordan, male    DOB: 1956-01-29, 63 y.o.   MRN: [id number]  HPI  Maurice Jordan is a 63 y/o male with a history of depression, DM, hyperlipidemia, HTN, osteomyelitis, current alcohol use and chronic heart failure.   Echo report from 05/19/17 reviewed and shows an EF of 30% along with mild Maurice and normal pulmonary artery pressures.  Has not been admitted or been in the ED in the last 6 months.   He presents today for a follow up visit with a chief complaint of   Past Medical History:  Diagnosis Date  . Alcohol abuse   . Congestive heart failure (HCC)   . Depression   . Diabetes (HCC)   . Hx MRSA infection 04/15/2014  . Hyperlipidemia   . Hypertension   . Neuropathy   . Osteomyelitis of toe (HCC)    3rd toe   Past Surgical History:  Procedure Laterality Date  . AMPUTATION OF REPLICATED TOES     Family History  Problem Relation Age of Onset  . Diabetes Mother   . Hypertension Mother    Social History   Tobacco Use  . Smoking status: Never Smoker  . Smokeless tobacco: Former Engineer, water Use Topics  . Alcohol use: Yes    Comment: 20 oz of beer    No Known Allergies     Review of Systems  Constitutional: Positive for fatigue (sometimes). Negative for appetite change and fever.  HENT: Negative for congestion, rhinorrhea and sore throat.   Eyes: Negative.   Respiratory: Positive for shortness of breath (minimal). Negative for cough and chest tightness.   Cardiovascular: Negative for chest pain, palpitations and leg swelling.  Gastrointestinal: Negative for abdominal distention and abdominal pain.  Endocrine: Negative.   Genitourinary: Negative.   Musculoskeletal: Negative for back pain and neck pain.  Skin: Negative.   Allergic/Immunologic: Negative.   Neurological: Positive for light-headedness (after taking morning medications). Negative for dizziness.  Hematological: Negative for adenopathy. Does not bruise/bleed easily.   Psychiatric/Behavioral: Negative for dysphoric mood and sleep disturbance (sleeping on 2 pillows). The patient is not nervous/anxious.      Physical Exam  Constitutional: He is oriented to person, place, and time. He appears well-developed and well-nourished.  HENT:  Head: Normocephalic and atraumatic.  Neck: Normal range of motion. Neck supple. No JVD present.  Cardiovascular: Normal rate and regular rhythm.  Pulmonary/Chest: Effort normal and breath sounds normal.  Abdominal: Soft. He exhibits no distension. There is no abdominal tenderness.  Musculoskeletal:        General: No tenderness or edema.  Neurological: He is alert and oriented to person, place, and time.  Skin: Skin is warm and dry.  Psychiatric: He has a normal mood and affect. His behavior is normal. Thought content normal.  Nursing note and vitals reviewed.  Assessment & Plan:  1: Chronic heart failure with reduced ejection fraction-  - NYHA class II - euvolemic today - weighing daily; reminded to call for an overnight weight gain of >2 pounds or a weekly weight gain of >5 pounds - weight  - not adding salt - losartan had been decreased by PCP due to hyperkalemia; had hypotension with entresto previously - consider changing losartan back to entresto if able - will get BMP today since it doesn't look like one has been done - Reviewed fluid intake; No more than 40-50 ounces/day  - encouraged complete alcohol cessation; drinking ~ 36 ounces of beer daily - BNP 02/13/15 was  414 - reports receiving his flu vaccine for this season  2: HTN- - BP - may need valtessa if potassium level rises - BMP from 06/22/18 reviewed and showed sodium 128, potassium 3.9, creatinine 1.90 and GFR 42  3: Diabetes- - fasting glucose at home today was  - continues on Lantus  - saw PCP Maurice Jordan) 03/12/18 - PCP made nephrology referral  Medication bottles were reviewed.        Marland Kitchen

## 2018-12-30 ENCOUNTER — Ambulatory Visit: Payer: Medicaid Other | Admitting: Family

## 2018-12-31 ENCOUNTER — Ambulatory Visit: Payer: Medicaid Other | Attending: Family | Admitting: Family

## 2018-12-31 ENCOUNTER — Encounter: Payer: Self-pay | Admitting: Pharmacist

## 2018-12-31 ENCOUNTER — Encounter: Payer: Self-pay | Admitting: Family

## 2018-12-31 ENCOUNTER — Other Ambulatory Visit: Payer: Self-pay

## 2018-12-31 VITALS — BP 97/77 | HR 78 | Resp 18 | Ht 66.0 in | Wt 183.5 lb

## 2018-12-31 DIAGNOSIS — N183 Chronic kidney disease, stage 3 unspecified: Secondary | ICD-10-CM

## 2018-12-31 DIAGNOSIS — Z8249 Family history of ischemic heart disease and other diseases of the circulatory system: Secondary | ICD-10-CM | POA: Insufficient documentation

## 2018-12-31 DIAGNOSIS — Z8739 Personal history of other diseases of the musculoskeletal system and connective tissue: Secondary | ICD-10-CM | POA: Diagnosis not present

## 2018-12-31 DIAGNOSIS — E1140 Type 2 diabetes mellitus with diabetic neuropathy, unspecified: Secondary | ICD-10-CM | POA: Diagnosis not present

## 2018-12-31 DIAGNOSIS — I110 Hypertensive heart disease with heart failure: Secondary | ICD-10-CM | POA: Insufficient documentation

## 2018-12-31 DIAGNOSIS — E785 Hyperlipidemia, unspecified: Secondary | ICD-10-CM | POA: Insufficient documentation

## 2018-12-31 DIAGNOSIS — Z7982 Long term (current) use of aspirin: Secondary | ICD-10-CM | POA: Insufficient documentation

## 2018-12-31 DIAGNOSIS — Z833 Family history of diabetes mellitus: Secondary | ICD-10-CM | POA: Insufficient documentation

## 2018-12-31 DIAGNOSIS — E1022 Type 1 diabetes mellitus with diabetic chronic kidney disease: Secondary | ICD-10-CM

## 2018-12-31 DIAGNOSIS — F329 Major depressive disorder, single episode, unspecified: Secondary | ICD-10-CM | POA: Insufficient documentation

## 2018-12-31 DIAGNOSIS — Z794 Long term (current) use of insulin: Secondary | ICD-10-CM | POA: Diagnosis not present

## 2018-12-31 DIAGNOSIS — I5022 Chronic systolic (congestive) heart failure: Secondary | ICD-10-CM | POA: Insufficient documentation

## 2018-12-31 DIAGNOSIS — I10 Essential (primary) hypertension: Secondary | ICD-10-CM

## 2018-12-31 DIAGNOSIS — Z8614 Personal history of Methicillin resistant Staphylococcus aureus infection: Secondary | ICD-10-CM | POA: Insufficient documentation

## 2018-12-31 DIAGNOSIS — Z79899 Other long term (current) drug therapy: Secondary | ICD-10-CM | POA: Diagnosis not present

## 2018-12-31 LAB — BASIC METABOLIC PANEL
ANION GAP: 6 (ref 5–15)
BUN: 15 mg/dL (ref 8–23)
CO2: 25 mmol/L (ref 22–32)
Calcium: 8.2 mg/dL — ABNORMAL LOW (ref 8.9–10.3)
Chloride: 103 mmol/L (ref 98–111)
Creatinine, Ser: 1.89 mg/dL — ABNORMAL HIGH (ref 0.61–1.24)
GFR calc Af Amer: 43 mL/min — ABNORMAL LOW (ref >60)
GFR calc non Af Amer: 37 mL/min — ABNORMAL LOW (ref >60)
Glucose, Bld: 136 mg/dL — ABNORMAL HIGH (ref 70–99)
Potassium: 4.9 mmol/L (ref 3.5–5.1)
Sodium: 134 mmol/L — ABNORMAL LOW (ref 135–145)

## 2018-12-31 MED ORDER — FUROSEMIDE 20 MG PO TABS: 20.0000 mg | ORAL_TABLET | Freq: Every day | ORAL | 5 refills | Status: AC

## 2018-12-31 MED ORDER — ASPIRIN 81 MG PO CHEW: 81.0000 mg | CHEWABLE_TABLET | Freq: Every day | ORAL | 5 refills | Status: AC

## 2018-12-31 MED ORDER — CARVEDILOL 12.5 MG PO TABS: 12.5000 mg | ORAL_TABLET | Freq: Two times a day (BID) | ORAL | 5 refills | Status: AC

## 2018-12-31 NOTE — Progress Notes (Signed)
Patient ID: Maurice Jordan, male    DOB: 25-Apr-1956, 63 y.o.   MRN: [id number]  HPI  Maurice Jordan is a 63 y/o male with a history of depression, DM, hyperlipidemia, HTN, osteomyelitis, current alcohol use and chronic heart failure.   Echo report from 05/19/17 reviewed and shows an EF of 30% along with mild Maurice and normal pulmonary artery pressures.  Has not been admitted or been in the ED in the last 6 months.   He presents today for a follow up visit with a chief complaint of minimal fatigue upon moderate exertion. He says that this has been present for several years. He has associated shortness of breath, pedal edema, light-headedness and gradual weight gain along with this. He denies any difficulty sleeping, abdominal distention, palpitations, chest pain or cough.   Past Medical History:  Diagnosis Date  . Alcohol abuse   . Congestive heart failure (HCC)   . Depression   . Diabetes (HCC)   . Hx MRSA infection 04/15/2014  . Hyperlipidemia   . Hypertension   . Neuropathy   . Osteomyelitis of toe (HCC)    3rd toe   Past Surgical History:  Procedure Laterality Date  . AMPUTATION OF REPLICATED TOES     Family History  Problem Relation Age of Onset  . Diabetes Mother   . Hypertension Mother    Social History   Tobacco Use  . Smoking status: Never Smoker  . Smokeless tobacco: Former Engineer, water Use Topics  . Alcohol use: Yes    Comment: 20 oz of beer    No Known Allergies  Prior to Admission medications   Medication Sig Start Date End Date Taking? Authorizing Provider  aspirin 81 MG chewable tablet Chew 81 mg by mouth daily.   Yes [provider]  atorvastatin (LIPITOR) 80 MG tablet Take 1 tablet (80 mg total) by mouth every evening. 05/20/17  Yes Enid Baas, MD  carvedilol (COREG) 25 MG tablet Take 1 tablet (25 mg total) by mouth 2 (two) times daily. 08/19/18 12/31/18 Yes Jode Lippe, Inetta Fermo A, FNP  citalopram (CELEXA) 20 MG tablet Take 1 tablet (20 mg total) by  mouth daily. 05/20/17  Yes Enid Baas, MD  gabapentin (NEURONTIN) 300 MG capsule Take 900 mg by mouth at bedtime.   Yes [provider]  insulin glargine (LANTUS) 100 UNIT/ML injection Inject 0.25 mLs (25 Units total) into the skin at bedtime. Patient taking differently: Inject 27 Units into the skin at bedtime. 28 units at bedtime 02/10/18  Yes Sainani, Rolly Pancake, MD  insulin lispro (HUMALOG) 100 UNIT/ML injection Inject 8 Units into the skin 2 (two) times daily before lunch and supper.   Yes [provider]  losartan (COZAAR) 25 MG tablet Take 25 mg by mouth daily.    Yes [provider]  tamsulosin (FLOMAX) 0.4 MG CAPS capsule Take 1 capsule (0.4 mg total) by mouth daily. 05/20/17  Yes Enid Baas, MD  docusate sodium (COLACE) 100 MG capsule Take 200 mg by mouth 2 (two) times daily.    [provider]  furosemide (LASIX) 20 MG tablet Take 20 mg by mouth daily. Take an extra tablet as needed for weight gain, swelling.    [provider]     Review of Systems  Constitutional: Positive for fatigue (sometimes). Negative for appetite change and fever.  HENT: Negative for congestion, rhinorrhea and sore throat.   Eyes: Negative.   Respiratory: Positive for shortness of breath (minimal). Negative for cough and  chest tightness.   Cardiovascular: Positive for leg swelling. Negative for chest pain and palpitations.  Gastrointestinal: Negative for abdominal distention and abdominal pain.  Endocrine: Negative.   Genitourinary: Negative.   Musculoskeletal: Negative for back pain and neck pain.  Skin: Negative.   Allergic/Immunologic: Negative.   Neurological: Positive for light-headedness (after taking morning medications). Negative for dizziness.  Hematological: Negative for adenopathy. Does not bruise/bleed easily.  Psychiatric/Behavioral: Negative for dysphoric mood and sleep disturbance (sleeping on 2 pillows). The patient is not  nervous/anxious.    Vitals:   12/31/18 1052  BP: 97/77  Pulse: 78  Resp: 18  SpO2: 100%  Weight: 183 lb 8 oz (83.2 kg)  Height: 5\' 6"  (1.676 m)   Wt Readings from Last 3 Encounters:  12/31/18 183 lb 8 oz (83.2 kg)  11/03/18 174 lb 6 oz (79.1 kg)  09/21/18 176 lb (79.8 kg)   Lab Results  Component Value Date   CREATININE 1.90 (H) 06/22/2018   CREATININE 1.91 (H) 03/24/2018   CREATININE 1.62 (H) 02/10/2018    Physical Exam  Constitutional: He is oriented to person, place, and time. He appears well-developed and well-nourished.  HENT:  Head: Normocephalic and atraumatic.  Neck: Normal range of motion. Neck supple. No JVD present.  Cardiovascular: Normal rate and regular rhythm.  Pulmonary/Chest: Effort normal and breath sounds normal.  Abdominal: Soft. He exhibits no distension. There is no abdominal tenderness.  Musculoskeletal:        General: Edema (1+ pitting bilateral lower legs) present. No tenderness.  Neurological: He is alert and oriented to person, place, and time.  Skin: Skin is warm and dry.  Psychiatric: He has a normal mood and affect. His behavior is normal. Thought content normal.  Nursing note and vitals reviewed.  Assessment & Plan:  1: Chronic heart failure with reduced ejection fraction-  - NYHA class II - euvolemic today - weighing daily; reminded to call for an overnight weight gain of >2 pounds or a weekly weight gain of >5 pounds - weight up 9 pounds from last visit here 2 months ago; he is unsure if he's taking the furosemide or not. New RX sent to pharmacy for this - not adding salt - losartan had been decreased by PCP due to hyperkalemia; had hypotension with entresto previously - BP will not allow changing losartan to entresto - will get BMP today to see how renal function is doing - Reviewed fluid intake; No more than 40-50 ounces/day  - encouraged complete alcohol cessation - BNP 02/13/15 was 414 - PharmD reconciled medications with the  patient - reports receiving his flu vaccine for this season  2: HTN- - BP remains on the low side - will decrease carvedilol to 12.5mg  BID; PharmD cut all his current 25mg  tablets in 1/2 and he was instructed to take 1/2 tablet twice daily. Once he finishes this prescription, he can get the new RX picked up and then take 12.5mg  tablet twice daily.  - may need valtessa if potassium level rises - BMP from 11/04/2018 [facility] office visit) reviewed and showed sodium 135, potassium 4.6, creatinine 2.36 and GFR 32  3: Diabetes- - fasting glucose at home today was 81 - continues on Lantus  - saw PCP Lorin Picket) 11/04/2018 - PCP made nephrology referral  Medication bottles were reviewed.  Return in 6 weeks or sooner for any questions/problems before then.         Marland Kitchen

## 2018-12-31 NOTE — Patient Instructions (Addendum)
Continue weighing daily and call for an overnight weight gain of > 2 pounds or a weekly weight gain of >5 pounds.  Resume taking furosemide (lasix) as 20mg  tablet once daily in the morning.  Finish your current carvedilol bottle by taking 1/2 tablet twice daily. When you finish with this bottle, you will start your new bottle which is a lower dose and resume taking 1 tablet twice daily at that time.

## 2018-12-31 NOTE — Progress Notes (Signed)
[place] - PHARMACIST COUNSELING NOTE  ADHERENCE ASSESSMENT  Adherence strategy: keeps medications together   Do you ever forget to take your medication? [] Yes (1) [x] No (0)  Do you ever skip doses due to side effects? [] Yes (1) [x] No (0)  Do you have trouble affording your medicines? [] Yes (1) [x] No (0)  Are you ever unable to pick up your medication due to transportation difficulties? [] Yes (1) [x] No (0)  Do you ever stop taking your medications because you don't believe they are helping? [] Yes (1) [x] No (0)  Total score _0______    Recommendations given to patient about increasing adherence: None. Patient reports good compliance with current regimen.  Guideline-Directed Medical Therapy/Evidence Based Medicine    ACE/ARB/ARNI: losartan 25 mg daily   Beta Blocker: carvedilol 25 mg twice daily   Aldosterone Antagonist: none (low BP) Diuretic: furosemide 20 mg daily + 20 mg PRN weight gain, swelling    SUBJECTIVE   HPI: Here for follow up visit. Reports shortness of breath when walking if he takes his carvedilol. If he takes his carvedilol after he walks, he does not have the shortness of breath.  Past Medical History:  Diagnosis Date  . Alcohol abuse   . Congestive heart failure ([place])   . Depression   . Diabetes ([place])   . Hx MRSA infection 04/15/2014  . Hyperlipidemia   . Hypertension   . Neuropathy   . Osteomyelitis of toe ([facility])    3rd toe        OBJECTIVE    Vital signs: HR 78, BP 97/77, weight (pounds) 183.8  ECHO: Date 05/19/17, EF 30%   BMP Latest Ref Rng & Units 06/22/2018 03/24/2018 02/10/2018  Glucose 70 - 99 mg/dL 157(H) 231(H) 180(H)  BUN 8 - 23 mg/dL 26(H) 23(H) 27(H)  Creatinine 0.61 - 1.24 mg/dL 1.90(H) 1.91(H) 1.62(H)  Sodium 135 - 145 mmol/L 128(L) 135 134(L)  Potassium 3.5 - 5.1 mmol/L 3.9 4.1 4.8  Chloride 98 - 111 mmol/L 92(L) 101 104  CO2 22 - 32 mmol/L 24 25 27   Calcium 8.9 - 10.3 mg/dL 9.0 9.4  8.9    ASSESSMENT 63 year old male with HFrEF. He was previously on Entresto but was unable to tolerate and had to go back to losartan. He reports shortness of breath when he is walking if he takes carvedilol. When he delays his dose until after his walk, he does not experience the shortness of breath. BP today on the lower end.    PLAN Decrease carvedilol to 12.5 mg BID. Patient's current tablets split for him so he can finish using them. Patient to continue all other medications. Further optimization of HF regimen limited by BP.    Time spent: 10 minutes  [place], Pharm.D. 12/31/2018 12:05 PM    Current Outpatient Medications:  .  aspirin 81 MG chewable tablet, Chew 1 tablet (81 mg total) by mouth daily., Disp: 30 tablet, Rfl: 5 .  atorvastatin (LIPITOR) 80 MG tablet, Take 1 tablet (80 mg total) by mouth every evening., Disp: 30 tablet, Rfl: 2 .  carvedilol (COREG) 12.5 MG tablet, Take 1 tablet (12.5 mg total) by mouth 2 (two) times daily., Disp: 60 tablet, Rfl: 5 .  citalopram (CELEXA) 20 MG tablet, Take 1 tablet (20 mg total) by mouth daily., Disp: 30 tablet, Rfl: 2 .  docusate sodium (COLACE) 100 MG capsule, Take 200 mg by mouth 2 (two) times daily., Disp: , Rfl:  .  furosemide (LASIX) 20 MG tablet, Take 1 tablet (20 mg total) by mouth daily. Take an extra tablet as needed for weight gain, swelling., Disp: 45 tablet, Rfl: 5 .  gabapentin (NEURONTIN) 300 MG capsule, Take 900 mg by mouth at bedtime., Disp: , Rfl:  .  insulin glargine (LANTUS) 100 UNIT/ML injection, Inject 0.25 mLs (25 Units total) into the skin at bedtime. (Patient taking differently: Inject 27 Units into the skin at bedtime. 28 units at bedtime), Disp: 10 mL, Rfl: 11 .  insulin lispro (HUMALOG) 100 UNIT/ML injection, Inject 8 Units into the skin 2 (two) times daily before lunch and supper., Disp: , Rfl:  .  losartan (COZAAR) 25 MG tablet, Take 25 mg by mouth daily. , Disp: , Rfl:  .  tamsulosin (FLOMAX) 0.4  MG CAPS capsule, Take 1 capsule (0.4 mg total) by mouth daily., Disp: 30 capsule, Rfl: 2   COUNSELING POINTS/CLINICAL PEARLS  Carvedilol (Goal: weight less than 85 kg is 25 mg BID, weight greater than 85 kg is 50 mg BID)  Patient should avoid activities requiring coordination until drug effects are realized, as drug may cause dizziness.  This drug may cause diarrhea, nausea, vomiting, arthralgia, back pain, myalgia, headache, vision disorder, erectile dysfunction, reduced libido, or fatigue.  Instruct patient to report signs/symptoms of adverse cardiovascular effects such as hypotension (especially in elderly patients), arrhythmias, syncope, palpitations, angina, or edema.  Drug may mask symptoms of hypoglycemia. Advise diabetic patients to carefully monitor blood sugar levels.  Patient should take drug with food.  Advise patient against sudden discontinuation of drug. Losartan (Goal: 150 mg once daily)  Warn male patient to avoid pregnancy and to report a pregnancy that occurs during therapy.  Side effects may include dizziness, upper respiratory infection, nasal congestion, and back pain.  Warn patient to avoid use of potassium supplements or potassium-containing salt substitutes unless they consult healthcare provider. Furosemide  Drug causes sun-sensitivity. Advise patient to use sunscreen and avoid tanning beds. Patient should avoid activities requiring coordination until drug effects are realized, as drug may cause dizziness, vertigo, or blurred vision. This drug may cause hyperglycemia, hyperuricemia, constipation, diarrhea, loss of appetite, nausea, vomiting, purpuric disorder, cramps, spasticity, asthenia, headache, paresthesia, or scaling eczema. Instruct patient to report unusual bleeding/bruising or signs/symptoms of hypotension, infection, pancreatitis, or ototoxicity (tinnitus, hearing impairment). Advise patient to report signs/symptoms of a severe skin reactions (flu-like  symptoms, spreading red rash, or skin/mucous membrane blistering) or erythema multiforme. Instruct patient to eat high-potassium foods during drug therapy, as directed by healthcare professional.  Patient should not drink alcohol while taking this drug.   DRUGS TO AVOID IN HEART FAILURE  Drug or Class Mechanism  Analgesics . NSAIDs . COX-2 inhibitors . Glucocorticoids  Sodium and water retention, increased systemic vascular resistance, decreased response to diuretics   Diabetes Medications . Metformin . Thiazolidinediones o Rosiglitazone (Avandia) o Pioglitazone (Actos) . DPP4 Inhibitors o Saxagliptin (Onglyza) o Sitagliptin (Januvia)   Lactic acidosis Possible calcium channel blockade   Unknown  Antiarrhythmics . Class I  o Flecainide o Disopyramide . Class III o Sotalol . Other o Dronedarone  Negative inotrope, proarrhythmic   Proarrhythmic, beta blockade  Negative inotrope  Antihypertensives . Alpha Blockers o Doxazosin . Calcium Channel Blockers o Diltiazem o Verapamil o Nifedipine . Central Alpha Adrenergics o Moxonidine . Peripheral Vasodilators o Minoxidil  Increases renin and aldosterone  Negative inotrope    Possible sympathetic withdrawal  Unknown  Anti-infective . Itraconazole . Amphotericin B  Negative inotrope  Unknown  Hematologic . Anagrelide . Cilostazol   Possible inhibition of PD IV Inhibition of PD III causing arrhythmias  Neurologic/Psychiatric . Stimulants . Anti-Seizure Drugs o Carbamazepine o Pregabalin . Antidepressants o Tricyclics o Citalopram . Parkinsons o Bromocriptine o Pergolide o Pramipexole . Antipsychotics o Clozapine . Antimigraine o Ergotamine o Methysergide . Appetite suppressants . Bipolar o Lithium  Peripheral alpha and beta agonist activity  Negative inotrope and chronotrope Calcium channel blockade  Negative inotrope, proarrhythmic Dose-dependent QT prolongation  Excessive  serotonin activity/valvular damage Excessive serotonin activity/valvular damage Unknown  IgE mediated hypersensitivy, calcium channel blockade  Excessive serotonin activity/valvular damage Excessive serotonin activity/valvular damage Valvular damage  Direct myofibrillar degeneration, adrenergic stimulation  Antimalarials . Chloroquine . Hydroxychloroquine Intracellular inhibition of lysosomal enzymes  Urologic Agents . Alpha Blockers o Doxazosin o Prazosin o Tamsulosin o Terazosin  Increased renin and aldosterone  Adapted from Page RL, et al. "Drugs That May Cause or Exacerbate Heart Failure: A Scientific Statement from the [place]." Circulation 2016; 921:J94-R74. DOI: 10.1161/CIR.0000000000000426   MEDICATION ADHERENCES TIPS AND STRATEGIES 1. Taking medication as prescribed improves patient outcomes in heart failure (reduces hospitalizations, improves symptoms, increases survival) 2. Side effects of medications can be managed by decreasing doses, switching agents, stopping drugs, or adding additional therapy. Please let someone in the [facility] know if you have having bothersome side effects so we can modify your regimen. Do not alter your medication regimen without talking to [place].  3. Medication reminders can help patients remember to take drugs on time. If you are missing or forgetting doses you can try linking behaviors, using pill boxes, or an electronic reminder like an alarm on your phone or an app. Some people can also get automated phone calls as medication reminders.

## 2019-01-22 ENCOUNTER — Telehealth: Payer: Self-pay | Admitting: Family

## 2019-01-22 DIAGNOSIS — I5022 Chronic systolic (congestive) heart failure: Secondary | ICD-10-CM

## 2019-01-22 NOTE — Telephone Encounter (Signed)
Received phone call from centralized scheduling regarding patient's echo order. Due to COVID-19 restrictions, they are having to reschedule patient's echo and current echo order will expire by the time the echo is done. New echo order was placed.

## 2019-01-28 ENCOUNTER — Ambulatory Visit: Payer: Medicaid Other

## 2019-02-04 ENCOUNTER — Telehealth: Payer: Self-pay

## 2019-02-04 NOTE — Telephone Encounter (Signed)
   TELEPHONE CALL NOTE  This patient has been deemed a candidate for follow-up tele-health visit to limit community exposure during the Covid-19 pandemic. I spoke with the patient via phone to discuss instructions. The patient was advised to review the section on consent for treatment as well. The patient will receive a phone call 2-3 days prior to their E-Visit at which time consent will be verbally confirmed. A Virtual Office Visit appointment type has been scheduled for 02/07/2018 with Clarisa Kindred FNP.  Nira Retort L, CMA 02/04/2019 11:26 AM

## 2019-02-04 NOTE — Telephone Encounter (Signed)
TELEPHONE CALL NOTE  Maurice Jordan has been deemed a candidate for a follow-up tele-health visit to limit community exposure during the Covid-19 pandemic. I spoke with the patient via phone to ensure availability of phone/video source, confirm preferred email & phone number, discuss instructions and expectations, and review consent.   I reminded Maurice Jordan to be prepared with any vital sign and/or heart rhythm information that could potentially be obtained via home monitoring, at the time of his visit.  Finally, I reminded Maurice Jordan to expect an e-mail containing a link for their video-based visit approximately 15 minutes before his visit, or alternatively, a phone call at the time of his visit if his visit is planned to be a phone encounter.  Did the patient verbally consent to treatment as below? YES  Maurice Jordan, CMA 02/04/2019 11:27 AM  CONSENT FOR TELE-HEALTH VISIT - PLEASE REVIEW  I hereby voluntarily request, consent and authorize [facility] and its employed or contracted physicians, physician assistants, nurse practitioners or other licensed health care professionals (the Practitioner), to provide me with telemedicine health care services (the "Services") as deemed necessary by the treating Practitioner. I acknowledge and consent to receive the Services by the Practitioner via telemedicine. I understand that the telemedicine visit will involve communicating with the Practitioner through telephonic communication technology and the disclosure of certain medical information by electronic transmission. I acknowledge that I have been given the opportunity to request an in-person assessment or other available alternative prior to the telemedicine visit and am voluntarily participating in the telemedicine visit.  I understand that I have the right to withhold or withdraw my consent to the use of telemedicine in the course of my care at any time, without affecting my  right to future care or treatment, and that the Practitioner or I may terminate the telemedicine visit at any time. I understand that I have the right to inspect all information obtained and/or recorded in the course of the telemedicine visit and may receive copies of available information for a reasonable fee.  I understand that some of the potential risks of receiving the Services via telemedicine include:  Marland Kitchen Delay or interruption in medical evaluation due to technological equipment failure or disruption; . Information transmitted may not be sufficient (e.g. poor resolution of images) to allow for appropriate medical decision making by the Practitioner; and/or  . In rare instances, security protocols could fail, causing a breach of personal health information.  Furthermore, I acknowledge that it is my responsibility to provide information about my medical history, conditions and care that is complete and accurate to the best of my ability. I acknowledge that Practitioner's advice, recommendations, and/or decision may be based on factors not within their control, such as incomplete or inaccurate data provided by me or lack of visual representation. I understand that the practice of medicine is not an exact science and that Practitioner makes no warranties or guarantees regarding treatment outcomes. I acknowledge that I will receive a copy of this consent concurrently upon execution via email to the email address I last provided but may also request a printed copy by calling the office of [facility].    I understand that my insurance may be billed for this visit.   I have read or had this consent read to me. . I understand the contents of this consent, which adequately explains the benefits and risks of the Services being provided via telemedicine.  . I have been  provided ample opportunity to ask questions regarding this consent and the Services and have had my questions answered to my  satisfaction. . I give my informed consent for the services to be provided through the use of telemedicine in my medical care  By participating in this telemedicine visit I agree to the above.

## 2019-02-08 ENCOUNTER — Ambulatory Visit: Payer: Medicaid Other | Attending: Family | Admitting: Family

## 2019-02-08 ENCOUNTER — Encounter: Payer: Self-pay | Admitting: Family

## 2019-02-08 ENCOUNTER — Other Ambulatory Visit: Payer: Self-pay

## 2019-02-08 VITALS — Wt 176.0 lb

## 2019-02-08 DIAGNOSIS — I5022 Chronic systolic (congestive) heart failure: Secondary | ICD-10-CM

## 2019-02-08 DIAGNOSIS — E1022 Type 1 diabetes mellitus with diabetic chronic kidney disease: Secondary | ICD-10-CM

## 2019-02-08 DIAGNOSIS — N183 Chronic kidney disease, stage 3 unspecified: Secondary | ICD-10-CM

## 2019-02-08 DIAGNOSIS — I10 Essential (primary) hypertension: Secondary | ICD-10-CM

## 2019-02-08 NOTE — Patient Instructions (Signed)
Continue weighing daily and call for an overnight weight gain of > 2 pounds or a weekly weight gain of >5 pounds. 

## 2019-02-08 NOTE — Progress Notes (Signed)
Virtual Visit via Telephone Note    Evaluation Performed:  Follow-up visit  This visit type was conducted due to national recommendations for restrictions regarding the COVID-19 Pandemic (e.g. social distancing).  This format is felt to be most appropriate for this patient at this time.  All issues noted in this document were discussed and addressed.  No physical exam was performed (except for noted visual exam findings with Video Visits).  Please refer to the patient's chart (MyChart message for video visits and phone note for telephone visits) for the patient's consent to telehealth for [facility]  Date:  02/08/2019   ID:  Maurice Jordan, DOB 08/08/1956, MRN [id number]  Patient Location:  [address] [place] [address]   Provider location:   [facility] [address] [place], [place] [address]  PCP:  Theodosia Blender, MD  Cardiologist:  No primary care provider on file.  Electrophysiologist:  None   Chief Complaint:  Minimal fatigue  History of Present Illness:    Maurice Jordan is a 63 y.o. male who presents via audio/video conferencing for a telehealth visit today.  Patient verified DOB and address.  The patient does not have symptoms concerning for COVID-19 infection (fever, chills, cough, or new SHORTNESS OF BREATH).   Patient describes minimal fatigue upon moderate exertion. He says that this has been present for several years. He has a chronic, intermittent dry cough along with this and occasional dizziness as well. He denies any swelling in his legs/ abdomen, palpitations, chest pain, shortness of breath, rhinorrhea, sore throat, difficulty sleeping or weight gain. Says that he's been eating well and is not adding any salt to his food. Does get his groceries delivered.   Prior CV studies:   The following studies were reviewed today:  Echo report from 05/19/17 reviewed and showed an EF of 30% along with mild MR.  Past  Medical History:  Diagnosis Date  . Alcohol abuse   . Congestive heart failure ([facility])   . Depression   . Diabetes ([facility])   . Hx MRSA infection 04/15/2014  . Hyperlipidemia   . Hypertension   . Neuropathy   . Osteomyelitis of toe ([facility])    3rd toe   Past Surgical History:  Procedure Laterality Date  . AMPUTATION OF REPLICATED TOES       Current Meds  Medication Sig  . aspirin 81 MG chewable tablet Chew 1 tablet (81 mg total) by mouth daily.  Marland Kitchen atorvastatin (LIPITOR) 80 MG tablet Take 1 tablet (80 mg total) by mouth every evening.  . carvedilol (COREG) 12.5 MG tablet Take 1 tablet (12.5 mg total) by mouth 2 (two) times daily.  . citalopram (CELEXA) 20 MG tablet Take 1 tablet (20 mg total) by mouth daily.  . furosemide (LASIX) 20 MG tablet Take 1 tablet (20 mg total) by mouth daily. Take an extra tablet as needed for weight gain, swelling. (Patient taking differently: Take 20 mg by mouth 2 (two) times daily. Take an extra tablet as needed for weight gain, swelling.)  . gabapentin (NEURONTIN) 300 MG capsule Take 900 mg by mouth at bedtime.  . insulin glargine (LANTUS) 100 UNIT/ML injection Inject 0.25 mLs (25 Units total) into the skin at bedtime. (Patient taking differently: Inject 28 Units into the skin at bedtime. 28 units at bedtime)  . losartan (COZAAR) 25 MG tablet Take 25 mg by mouth daily.   . tamsulosin (FLOMAX) 0.4 MG CAPS capsule  Take 1 capsule (0.4 mg total) by mouth daily.     Allergies:   Patient has no known allergies.   Social History   Tobacco Use  . Smoking status: Never Smoker  . Smokeless tobacco: Former Engineer, water Use Topics  . Alcohol use: Yes    Comment: 20 oz of beer   . Drug use: No     Family Hx: The patient's family history includes Diabetes in his mother; Hypertension in his mother.  ROS:   Please see the history of present illness.     All other systems reviewed and are negative.   Labs/Other Tests and Data Reviewed:    Recent  Labs: 02/08/2018: ALT 49; Magnesium 1.9 06/22/2018: Hemoglobin 13.6; Platelets 161 12/31/2018: BUN 15; Creatinine, Ser 1.89; Potassium 4.9; Sodium 134   Recent Lipid Panel Lab Results  Component Value Date/Time   CHOL 102 11/30/2017 05:43 AM   CHOL 126 06/01/2012 04:51 AM   TRIG 129 11/30/2017 05:43 AM   TRIG 230 (H) 06/01/2012 04:51 AM   HDL 25 (L) 11/30/2017 05:43 AM   HDL 38 (L) 06/01/2012 04:51 AM   CHOLHDL 4.1 11/30/2017 05:43 AM   LDLCALC 51 11/30/2017 05:43 AM   LDLCALC 42 06/01/2012 04:51 AM    Wt Readings from Last 3 Encounters:  02/08/19 176 lb (79.8 kg)  12/31/18 183 lb 8 oz (83.2 kg)  11/03/18 174 lb 6 oz (79.1 kg)     Exam:    Vital Signs:  Wt 176 lb (79.8 kg) Comment: self-reported  BMI 28.41 kg/m    Well nourished, well developed male in no  acute distress.   ASSESSMENT & PLAN:    1. Chronic heart failure with reduced ejection fraction- - NYHA class II - euvolemic per patient's description of symptoms   - weighing daily and he says that his weight has been stable. Reminded to call for an overnight weight gain of >2 pounds or a weekly weight gain of >5 pounds - not adding salt to his food and is trying to eat low sodium foods. Is having his groceries delivered so is not having to go to the grocery store - echo currently scheduled for 02/25/2019 - did decrease his carvedilol at his last visit and he says that he has felt better since then.  - drinks 12 ounces beer 1-2 times/ week  2: HTN- - patient does not check his BP at home; has been low on previous occasions - BMP from 12/31/2018 reviewed and showed sodium 134, potassium 4.9, creatinine 1.89 and GFR 43  3: Diabetes- - patient checked fasting glucose while on the phone with me and it was 71 - says that he doesn't take his humalog before meals very often but does take lantus at bedtime  COVID-19 Education: The signs and symptoms of COVID-19 were discussed with the patient and how to seek care for testing  (follow up with PCP or arrange E-visit).  The importance of social distancing was discussed today.  Patient Risk:   After full review of this patients clinical status, I feel that they are at least moderate risk at this time.  Time:   Today, I have spent 15 minutes with the patient with telehealth technology discussing medications, daily weighing and symptoms to report.   Medication Adjustments/Labs and Tests Ordered: Current medicines are reviewed at length with the patient today.  Concerns regarding medicines are outlined above.   Tests Ordered: No orders of the defined types were placed in this  encounter.  Medication Changes: No orders of the defined types were placed in this encounter.   Disposition:  Follow-up in 3 months or sooner for any questions/problems before then.   Signed, Delma Freeze, FNP  02/08/2019 9:29 AM    [facility]

## 2019-02-25 ENCOUNTER — Ambulatory Visit: Admission: RE | Admit: 2019-02-25 | Payer: Medicaid Other | Source: Ambulatory Visit

## 2019-03-22 DEATH — deceased

## 2019-05-07 NOTE — Progress Notes (Deleted)
Patient ID: Maurice Jordan, male    DOB: 20-Sep-1956, 63 y.o.   MRN: [id number]  HPI  Mr Pembleton is a 63 y/o male with a history of depression, DM, hyperlipidemia, HTN, osteomyelitis, current alcohol use and chronic heart failure.   Echo report from 05/19/17 reviewed and shows an EF of 30% along with mild MR and normal pulmonary artery pressures.  Has not been admitted or been in the ED in the last 6 months.   He presents today for a follow up visit with a chief complaint of    Past Medical History:  Diagnosis Date  . Alcohol abuse   . Congestive heart failure (HCC)   . Depression   . Diabetes (HCC)   . Hx MRSA infection 04/15/2014  . Hyperlipidemia   . Hypertension   . Neuropathy   . Osteomyelitis of toe (HCC)    3rd toe   Past Surgical History:  Procedure Laterality Date  . AMPUTATION OF REPLICATED TOES     Family History  Problem Relation Age of Onset  . Diabetes Mother   . Hypertension Mother    Social History   Tobacco Use  . Smoking status: Never Smoker  . Smokeless tobacco: Former Engineer, water Use Topics  . Alcohol use: Yes    Comment: 20 oz of beer    No Known Allergies     Review of Systems  Constitutional: Positive for fatigue (sometimes). Negative for appetite change and fever.  HENT: Negative for congestion, rhinorrhea and sore throat.   Eyes: Negative.   Respiratory: Positive for shortness of breath (minimal). Negative for cough and chest tightness.   Cardiovascular: Positive for leg swelling. Negative for chest pain and palpitations.  Gastrointestinal: Negative for abdominal distention and abdominal pain.  Endocrine: Negative.   Genitourinary: Negative.   Musculoskeletal: Negative for back pain and neck pain.  Skin: Negative.   Allergic/Immunologic: Negative.   Neurological: Positive for light-headedness (after taking morning medications). Negative for dizziness.  Hematological: Negative for adenopathy. Does not bruise/bleed easily.   Psychiatric/Behavioral: Negative for dysphoric mood and sleep disturbance (sleeping on 2 pillows). The patient is not nervous/anxious.      Physical Exam  Constitutional: He is oriented to person, place, and time. He appears well-developed and well-nourished.  HENT:  Head: Normocephalic and atraumatic.  Neck: Normal range of motion. Neck supple. No JVD present.  Cardiovascular: Normal rate and regular rhythm.  Pulmonary/Chest: Effort normal and breath sounds normal.  Abdominal: Soft. He exhibits no distension. There is no abdominal tenderness.  Musculoskeletal:        General: Edema (1+ pitting bilateral lower legs) present. No tenderness.  Neurological: He is alert and oriented to person, place, and time.  Skin: Skin is warm and dry.  Psychiatric: He has a normal mood and affect. His behavior is normal. Thought content normal.  Nursing note and vitals reviewed.  Assessment & Plan:  1: Chronic heart failure with reduced ejection fraction-  - NYHA class II - euvolemic today - weighing daily; reminded to call for an overnight weight gain of >2 pounds or a weekly weight gain of >5 pounds - weight 183.8 from last visit here 4 months ago - not adding salt - losartan had been decreased by PCP due to hyperkalemia; had hypotension with entresto previously - BP will not allow changing losartan to entresto  - Reviewed fluid intake; No more than 40-50 ounces/day  - encouraged complete alcohol cessation - BNP 02/13/15 was 414   2: HTN- -  BP  - BMP from 12/31/2018 reviewed and showed sodium 134, potassium 3.9, creatinine 1.89 and GFR 43  3: Diabetes- - fasting glucose at home today was  - continues on Lantus  - saw PCP Nicki Reaper) 11/04/2018 - PCP made nephrology referral  Medication bottles were reviewed.          Marland Kitchen

## 2019-05-11 ENCOUNTER — Ambulatory Visit: Payer: Medicaid Other | Admitting: Family

## 2019-05-11 ENCOUNTER — Telehealth: Payer: Self-pay | Admitting: Family

## 2019-05-11 NOTE — Telephone Encounter (Signed)
Patient did not show for his [facility] appointment on 05/11/2019. Will attempt to reschedule.

## 2019-06-05 IMAGING — CT CT HEAD W/O CM
3 series · 15 of 47 positions shown, 18 images · non-contrast
Comparison: CT scan of March 11, 2015.

CLINICAL DATA: Seizure.

EXAM:
CT HEAD WITHOUT CONTRAST
TECHNIQUE: Contiguous axial images were obtained from the base of the skull
through the vertex without intravenous contrast.

[Series 2: head wo · axial · 0.47mm/px · z∈[-174,-49]mm · 9 of 31 slices shown, 12 images]
[im 3/31  brain]
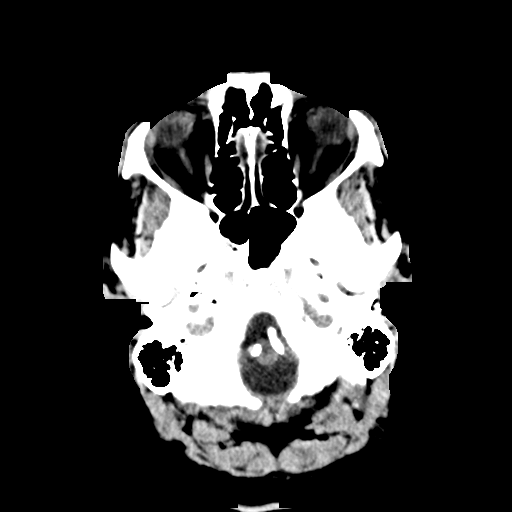
[im 3/31  bone]
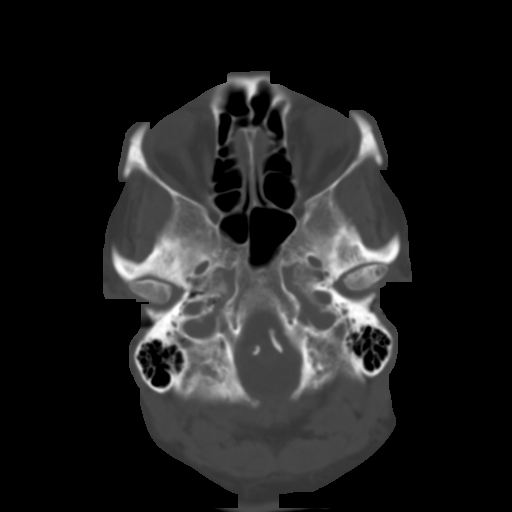
[im 6/31  brain]
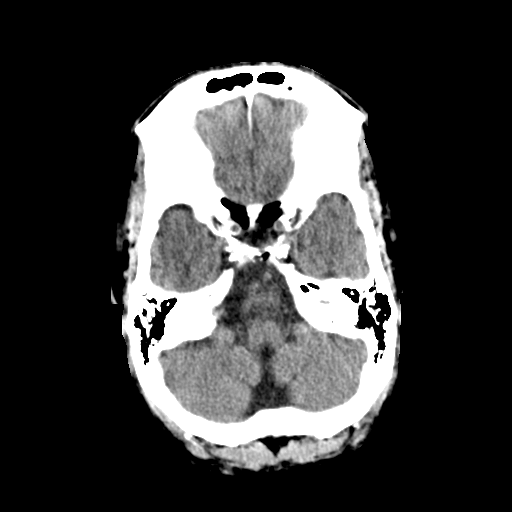
[im 9/31  brain]
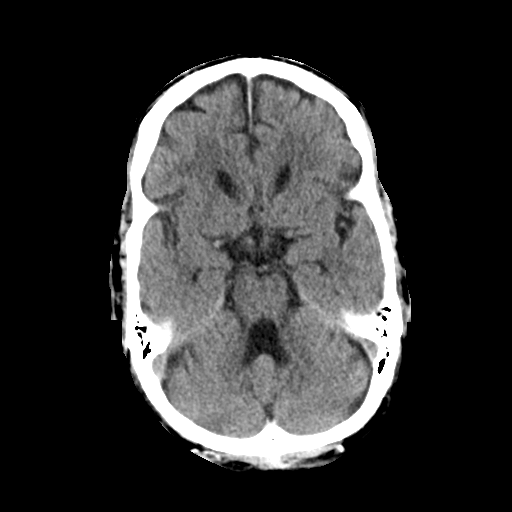
[im 12/31  brain]
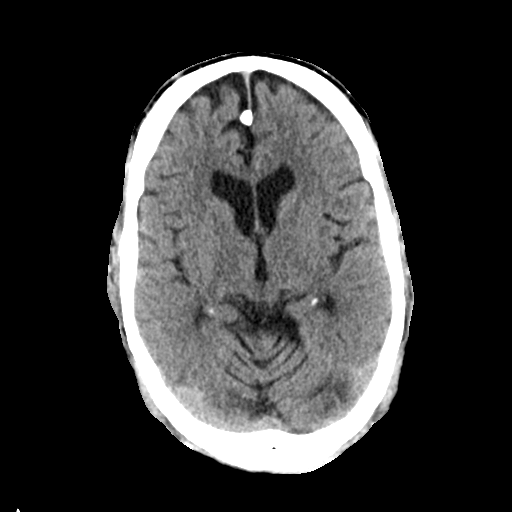
[im 16/31  brain]
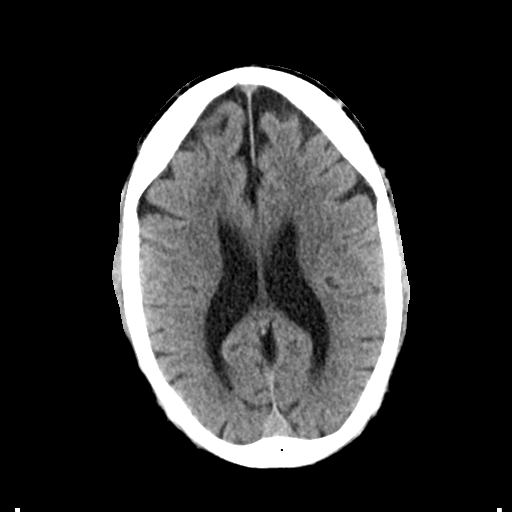
[im 16/31  bone]
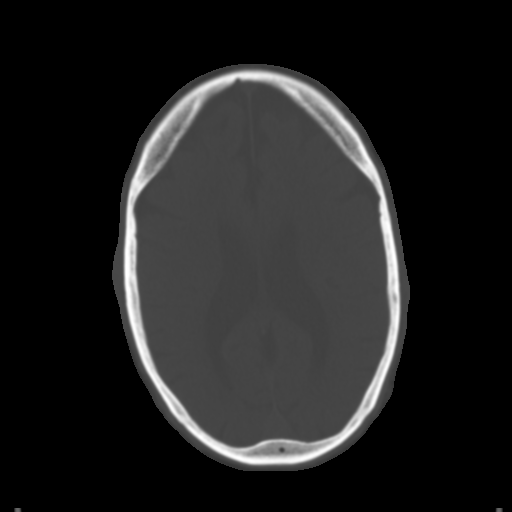
[im 19/31  brain]
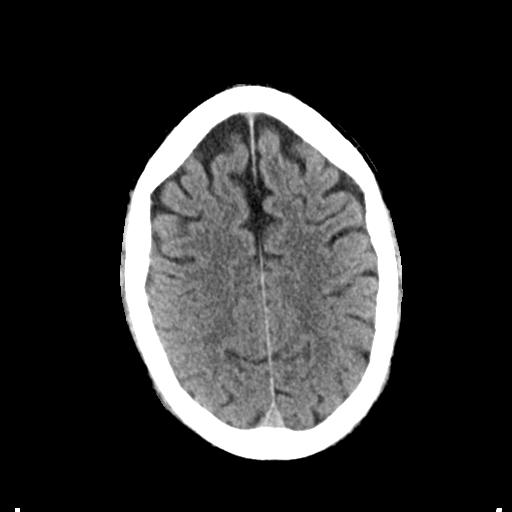
[im 22/31  brain]
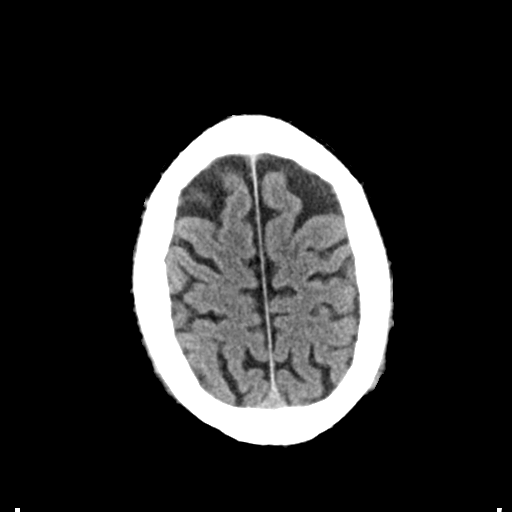
[im 25/31  brain]
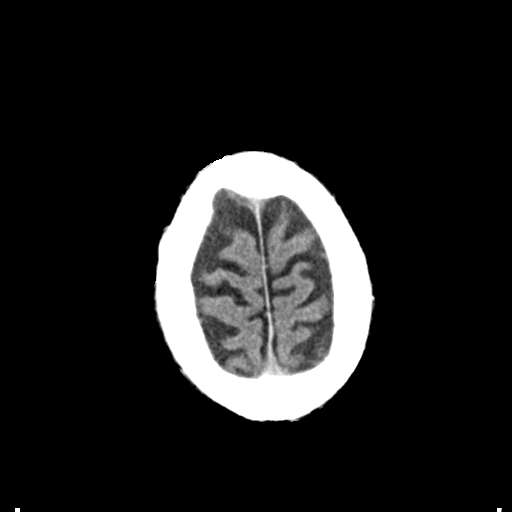
[im 28/31  brain]
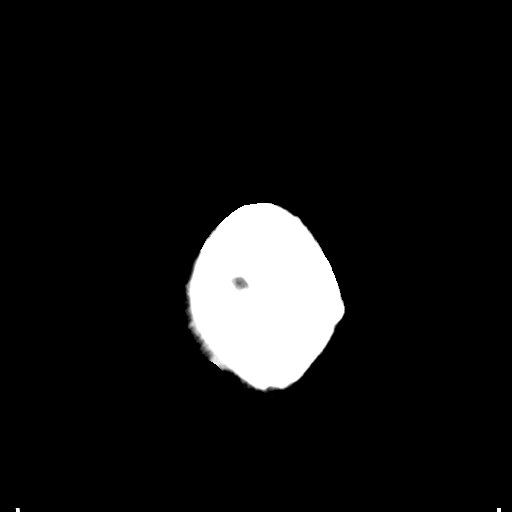
[im 28/31  bone]
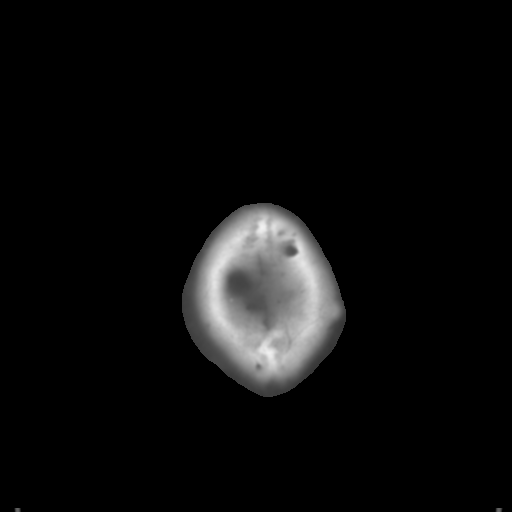

[Series 4: coronal soft tissue · coronal · 0.30mm/px · 3 of 67 slices shown]
[im 23/67  brain]
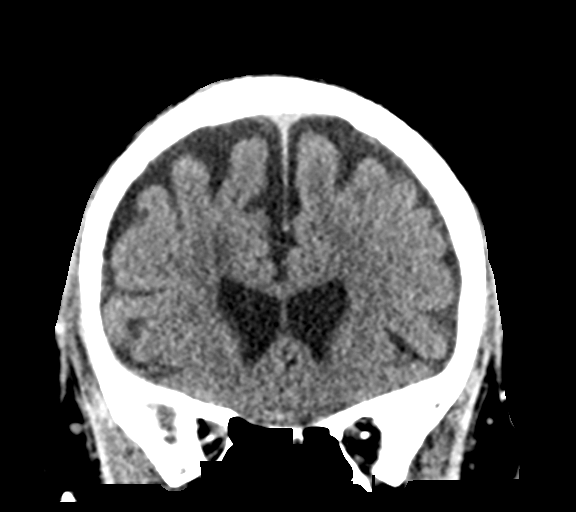
[im 30/67  brain]
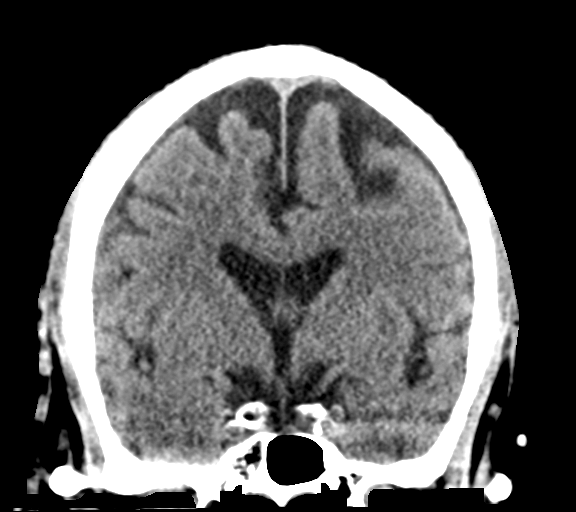
[im 37/67  brain]
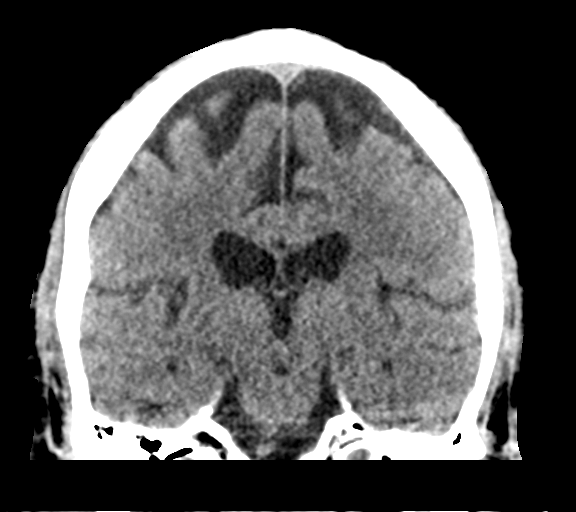

[Series 5: sagittal soft tissue · sagittal · 0.32mm/px · 3 of 52 slices shown]
[im 18/52  brain]
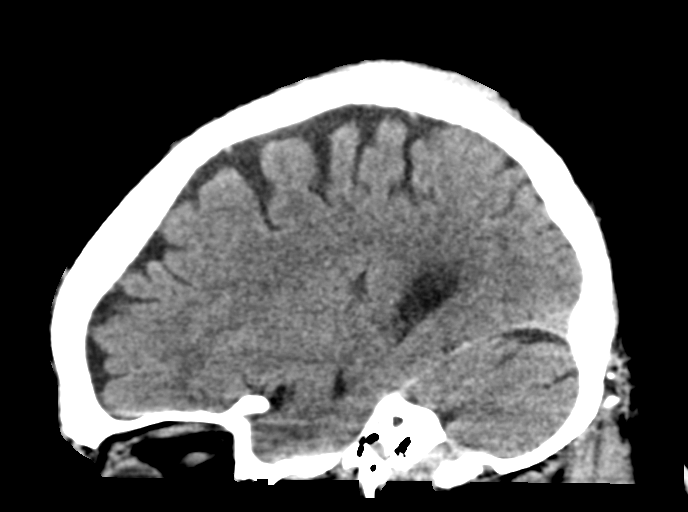
[im 26/52  brain]
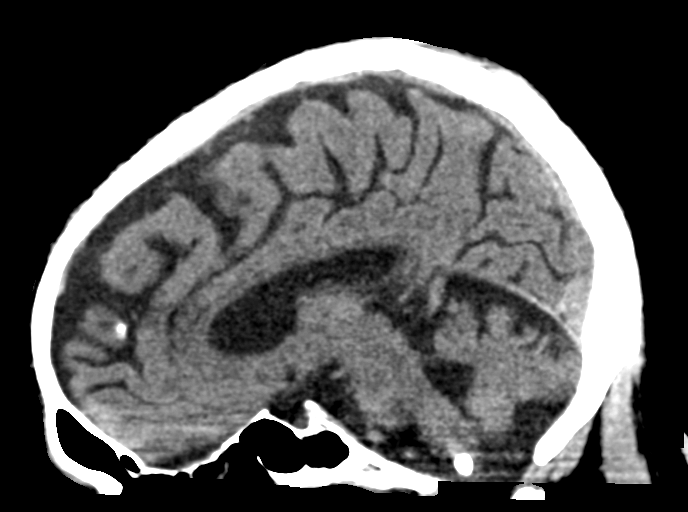
[im 35/52  brain]
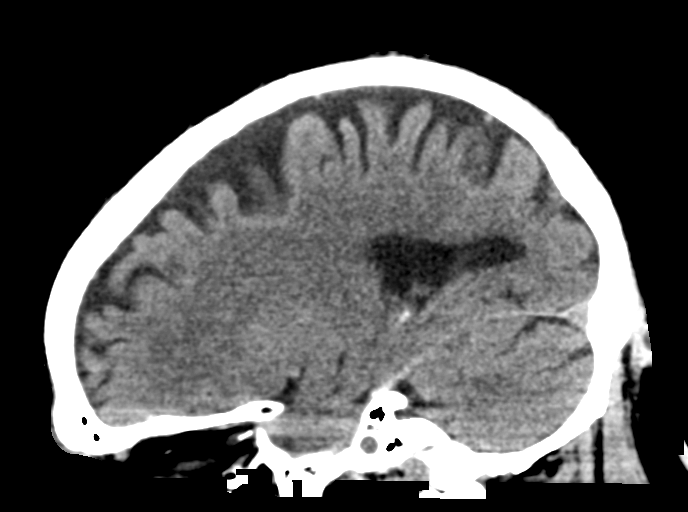

[15 of 47 positions shown; findings below may reference images not displayed]

FINDINGS: Brain: Mild diffuse cortical atrophy is noted. No mass effect or
midline shift is noted. Ventricular size is within normal limits.
There is no evidence of mass lesion, hemorrhage or acute infarction.

Vascular: No hyperdense vessel or unexpected calcification.

Skull: Normal. Negative for fracture or focal lesion.

Sinuses/Orbits: No acute finding.

Other: None.
IMPRESSION: Mild diffuse cortical atrophy. No acute intracranial abnormality
seen.

## 2020-02-26 IMAGING — DX DG CHEST 1V PORT
1 series · 1 of 1 positions shown · non-contrast
Comparison: To [DATE]

CLINICAL DATA: Per nurse note: Pt arrives [facility] from home where
roommate found pt lying on floor. CBG 28. EMS gave 50g D50, CBG 104.
Pt is cold to touch. Hx ETOH abuse. Pt will re-state what you say to
him but will not answer questions.

EXAM:
PORTABLE CHEST 1 VIEW

[chest ap]
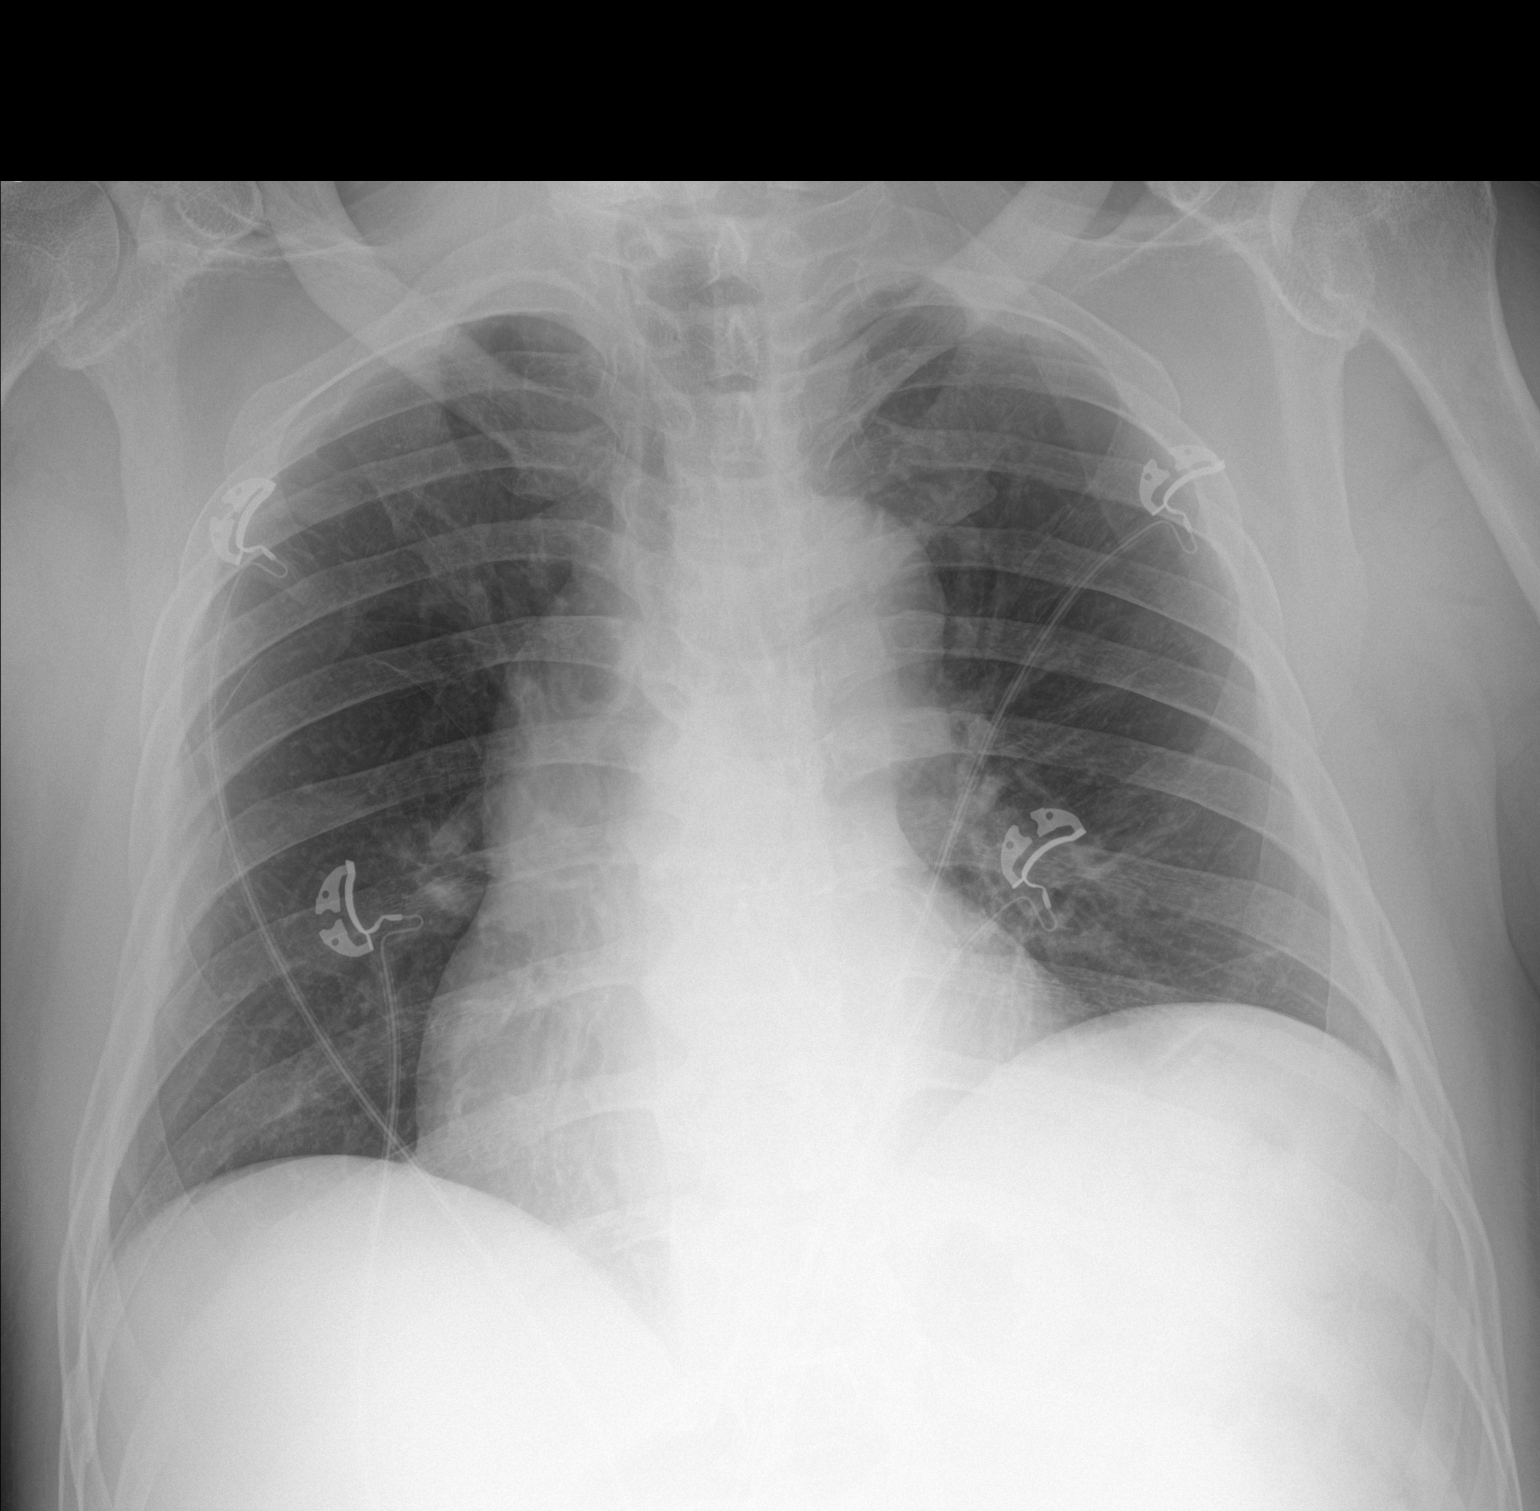

[1 of 1 positions shown; findings below may reference images not displayed]

FINDINGS: The heart size and mediastinal contours are within normal limits.
Both lungs are clear. The visualized skeletal structures are
unremarkable.
IMPRESSION: No active disease.
